# Patient Record
Sex: Female | Born: 1970
Health system: Southern US, Community
[De-identification: ages and names within clinical notes are randomized; demographics above are authoritative.]

## PROBLEM LIST (undated history)

## (undated) DIAGNOSIS — E559 Vitamin D deficiency, unspecified: Secondary | ICD-10-CM

## (undated) DIAGNOSIS — R1310 Dysphagia, unspecified: Secondary | ICD-10-CM

## (undated) DIAGNOSIS — F988 Other specified behavioral and emotional disorders with onset usually occurring in childhood and adolescence: Secondary | ICD-10-CM

## (undated) DIAGNOSIS — M549 Dorsalgia, unspecified: Secondary | ICD-10-CM

## (undated) DIAGNOSIS — D259 Leiomyoma of uterus, unspecified: Secondary | ICD-10-CM

## (undated) DIAGNOSIS — D500 Iron deficiency anemia secondary to blood loss (chronic): Secondary | ICD-10-CM

## (undated) DIAGNOSIS — F419 Anxiety disorder, unspecified: Secondary | ICD-10-CM

## (undated) DIAGNOSIS — R030 Elevated blood-pressure reading, without diagnosis of hypertension: Secondary | ICD-10-CM

## (undated) DIAGNOSIS — G43909 Migraine, unspecified, not intractable, without status migrainosus: Secondary | ICD-10-CM

## (undated) DIAGNOSIS — R079 Chest pain, unspecified: Secondary | ICD-10-CM

## (undated) DIAGNOSIS — F329 Major depressive disorder, single episode, unspecified: Secondary | ICD-10-CM

## (undated) DIAGNOSIS — T7840XA Allergy, unspecified, initial encounter: Secondary | ICD-10-CM

## (undated) DIAGNOSIS — K5900 Constipation, unspecified: Secondary | ICD-10-CM

## (undated) DIAGNOSIS — E785 Hyperlipidemia, unspecified: Secondary | ICD-10-CM

## (undated) DIAGNOSIS — F32A Depression, unspecified: Secondary | ICD-10-CM

## (undated) DIAGNOSIS — K219 Gastro-esophageal reflux disease without esophagitis: Secondary | ICD-10-CM

## (undated) HISTORY — DX: Chest pain, unspecified: R07.9

## (undated) HISTORY — DX: Other specified behavioral and emotional disorders with onset usually occurring in childhood and adolescence: F98.8

## (undated) HISTORY — DX: Dorsalgia, unspecified: M54.9

## (undated) HISTORY — DX: Morbid (severe) obesity due to excess calories: E66.01

## (undated) HISTORY — DX: Hyperlipidemia, unspecified: E78.5

## (undated) HISTORY — PX: FRACTURE SURGERY: SHX138

## (undated) HISTORY — DX: Constipation, unspecified: K59.00

## (undated) HISTORY — DX: Allergy, unspecified, initial encounter: T78.40XA

## (undated) HISTORY — DX: Leiomyoma of uterus, unspecified: D25.9

## (undated) HISTORY — DX: Elevated blood-pressure reading, without diagnosis of hypertension: R03.0

## (undated) HISTORY — DX: Iron deficiency anemia secondary to blood loss (chronic): D50.0

## (undated) HISTORY — DX: Dysphagia, unspecified: R13.10

## (undated) HISTORY — DX: Vitamin D deficiency, unspecified: E55.9

## (undated) HISTORY — PX: ANKLE FRACTURE SURGERY: SHX122

## (undated) HISTORY — DX: Gastro-esophageal reflux disease without esophagitis: K21.9

---

## 1998-01-14 ENCOUNTER — Other Ambulatory Visit: Admission: RE | Admit: 1998-01-14 | Discharge: 1998-01-14 | Payer: Self-pay | Admitting: Obstetrics and Gynecology

## 1998-08-08 HISTORY — PX: HAND SURGERY: SHX662

## 1999-05-03 ENCOUNTER — Encounter: Payer: Self-pay | Admitting: Emergency Medicine

## 1999-05-03 ENCOUNTER — Emergency Department (HOSPITAL_COMMUNITY): Admission: EM | Admit: 1999-05-03 | Discharge: 1999-05-03 | Payer: Self-pay | Admitting: Emergency Medicine

## 1999-11-10 ENCOUNTER — Ambulatory Visit (HOSPITAL_COMMUNITY): Admission: RE | Admit: 1999-11-10 | Discharge: 1999-11-10 | Payer: Self-pay | Admitting: Obstetrics and Gynecology

## 1999-11-10 ENCOUNTER — Encounter: Payer: Self-pay | Admitting: Obstetrics and Gynecology

## 2000-08-08 HISTORY — PX: UTERINE FIBROID SURGERY: SHX826

## 2001-04-18 ENCOUNTER — Other Ambulatory Visit: Admission: RE | Admit: 2001-04-18 | Discharge: 2001-04-18 | Payer: Self-pay | Admitting: Family Medicine

## 2001-04-24 ENCOUNTER — Encounter: Payer: Self-pay | Admitting: Family Medicine

## 2001-04-24 ENCOUNTER — Encounter: Admission: RE | Admit: 2001-04-24 | Discharge: 2001-04-24 | Payer: Self-pay | Admitting: Family Medicine

## 2001-05-18 ENCOUNTER — Encounter (INDEPENDENT_AMBULATORY_CARE_PROVIDER_SITE_OTHER): Payer: Self-pay | Admitting: Specialist

## 2001-05-18 ENCOUNTER — Inpatient Hospital Stay (HOSPITAL_COMMUNITY): Admission: RE | Admit: 2001-05-18 | Discharge: 2001-05-20 | Payer: Self-pay | Admitting: Obstetrics and Gynecology

## 2002-08-08 HISTORY — PX: DILATION AND CURETTAGE OF UTERUS: SHX78

## 2004-07-28 ENCOUNTER — Ambulatory Visit: Payer: Self-pay | Admitting: Family Medicine

## 2004-09-09 ENCOUNTER — Ambulatory Visit: Payer: Self-pay | Admitting: Family Medicine

## 2004-10-02 ENCOUNTER — Ambulatory Visit: Payer: Self-pay | Admitting: Family Medicine

## 2004-12-03 ENCOUNTER — Ambulatory Visit: Payer: Self-pay | Admitting: Family Medicine

## 2005-04-01 ENCOUNTER — Ambulatory Visit: Payer: Self-pay | Admitting: Family Medicine

## 2006-03-23 ENCOUNTER — Ambulatory Visit: Payer: Self-pay | Admitting: Internal Medicine

## 2006-07-05 ENCOUNTER — Ambulatory Visit: Payer: Self-pay | Admitting: Family Medicine

## 2006-07-28 ENCOUNTER — Ambulatory Visit: Payer: Self-pay | Admitting: Family Medicine

## 2006-11-09 ENCOUNTER — Ambulatory Visit: Payer: Self-pay | Admitting: Family Medicine

## 2006-11-09 LAB — CONVERTED CEMR LAB
ALT: 15 units/L (ref 0–40)
AST: 21 units/L (ref 0–37)
Albumin: 3.4 g/dL — ABNORMAL LOW (ref 3.5–5.2)
Alkaline Phosphatase: 57 units/L (ref 39–117)
BUN: 8 mg/dL (ref 6–23)
Basophils Absolute: 0 10*3/uL (ref 0.0–0.1)
Basophils Relative: 0.4 % (ref 0.0–1.0)
Bilirubin, Direct: 0.1 mg/dL (ref 0.0–0.3)
CO2: 32 meq/L (ref 19–32)
Calcium: 9.2 mg/dL (ref 8.4–10.5)
Chloride: 103 meq/L (ref 96–112)
Creatinine, Ser: 0.7 mg/dL (ref 0.4–1.2)
Eosinophils Absolute: 0.1 10*3/uL (ref 0.0–0.6)
Eosinophils Relative: 0.9 % (ref 0.0–5.0)
GFR calc Af Amer: 122 mL/min
GFR calc non Af Amer: 101 mL/min
Glucose, Bld: 89 mg/dL (ref 70–99)
HCT: 35.5 % — ABNORMAL LOW (ref 36.0–46.0)
Hemoglobin: 11.8 g/dL — ABNORMAL LOW (ref 12.0–15.0)
Lymphocytes Relative: 33.7 % (ref 12.0–46.0)
MCHC: 33.4 g/dL (ref 30.0–36.0)
MCV: 88 fL (ref 78.0–100.0)
Monocytes Absolute: 0.9 10*3/uL — ABNORMAL HIGH (ref 0.2–0.7)
Monocytes Relative: 15.9 % — ABNORMAL HIGH (ref 3.0–11.0)
Neutro Abs: 2.8 10*3/uL (ref 1.4–7.7)
Neutrophils Relative %: 49.1 % (ref 43.0–77.0)
Platelets: 312 10*3/uL (ref 150–400)
Potassium: 4.1 meq/L (ref 3.5–5.1)
RBC: 4.03 M/uL (ref 3.87–5.11)
RDW: 15 % — ABNORMAL HIGH (ref 11.5–14.6)
Sodium: 138 meq/L (ref 135–145)
TSH: 2.05 microintl units/mL (ref 0.35–5.50)
Total Bilirubin: 0.8 mg/dL (ref 0.3–1.2)
Total Protein: 8 g/dL (ref 6.0–8.3)
WBC: 5.7 10*3/uL (ref 4.5–10.5)

## 2007-05-25 ENCOUNTER — Encounter: Admission: RE | Admit: 2007-05-25 | Discharge: 2007-05-25 | Payer: Self-pay | Admitting: Obstetrics and Gynecology

## 2008-01-10 ENCOUNTER — Encounter: Payer: Self-pay | Admitting: Family Medicine

## 2008-01-30 ENCOUNTER — Encounter: Payer: Self-pay | Admitting: Family Medicine

## 2010-12-24 NOTE — H&P (Signed)
[facility]  Patient:    [name], [name] Visit Number: [id number] MRN: [id number]          Service Type: GYN Location: 4W 0455 01 Attending Physician:  Malon Kindle Dictated by:   Malachi Pro. Ambrose Mantle, M.D. Admit Date:  05/18/2001                           History and Physical  HISTORY OF PRESENT ILLNESS:  This is a 40 year old, black, single female, para 0-0-1-0, who was admitted to the hospital for myomectomy and right lower abdominal pain.  Her last menstrual period was April 27, 2001.  The patient has not had any sex in quite some time and definitely since her last period.  The patients periods are regular, last four to five days, moderate flow, and moderate to severe dysmenorrhea.  The patient was initially seen in 1998, at which time she had had a history of left lower abdominal pain.  Her examination was felt to be normal.  She was started on birth control pills late in 1998 and stopped them because of nausea.  She reported in 1999 that she had had pain in June of 1999 in the right lower quadrant.  She could not lie on her abdomen.  The pelvic exam revealed what was felt to be a possible endometriosis nodule possibly on the anterior pelvic peritoneum.  In 2001, the patient returned with right lower abdominal pain that had occurred just before her last period, improved during her menses, but then recurred.  There was felt to be a 2 x 2 cm mass either in the right ovary or the right broad ligament.  An ultrasound was requested and the ultrasound showed a pedunculated fibroid on the anterior uterus, which was 3 x 2.3 cm.  The right ovary measured 27 x 33 mm and the left ovary was normal.  Later in 2001, the patient returned and the structure on the right anterior uterus or the peritoneum persisted.  Subsequently the patient was seen in August of 2001, at which time she was complaining of pain in the lower abdomen.  In 2002,  the patient continued to have right lower abdominal pain unrelated to any known activity.  She underwent a subsequent ultrasound and it showed a 4 cm pedunculated anterior uterine fibroid with faint calcification.  There were no other pelvic abnormalities.  The fibroid measured 4.5 x 3.6 cm.  The patient was informed that the fibroid could be causing the pain, although there was certainly no guarantee that removing the fibroid would remove the pain, but she did want to proceed with the surgery.  ALLERGIES:  No known drug allergies.  PAST SURGICAL HISTORY:  The patient had left hand surgery in 2000.  PAST MEDICAL HISTORY:  She has not had any major adult illnesses or heart problems.  She does have headaches.  SOCIAL HISTORY:  She does not drink or smoke.  The patient is a Buyer, retail of [employer].  She went to [employer] and got a degree in business management.  She is in Clinical biochemist for [employer].  MEDICATIONS:  She is on no medications.  FAMILY HISTORY:  Her mother is 36.  Her father is 58.  Two brothers and one sister are living and well.  PHYSICAL EXAMINATION:  A well-developed, obese, black female in no acute distress.  VITAL SIGNS:  Her last blood pressure was 130/76 with a pulse of  80.  HEENT:  No cranial abnormalities.  Extraocular movements are intact.  Nose and pharynx clear.  NECK:  Supple without thyromegaly.  HEART:  Normal size and sounds.  No murmurs.  LUNGS:  Clear to P&A.  BREASTS:  Soft without masses.  ABDOMEN:  Soft and nontender.  No masses are palpable.  The liver, spleen, and kidneys are not felt.  PELVIC:  The vulva and vagina are clean.  The cervix is clean.  The uterus is anterior and normal size.  There is a fibroid on the right side.  The uterus is tender.  The adnexa are clear.  A Pap smear in June of 1999 was within normal limits.  ADMITTING IMPRESSION:  Leiomyomata uteri and right lower abdominal pain.  The patient  is informed that there is a possibility that her right lower abdominal pain is related to the fibroid since the fibroid is tender and it is located in the area where her pain is.  However, she has been given no guarantee as to the outcome of the surgery as far as the pain goes, that our goal is to remove the fibroid and hope the pain resolves.  She understands and she understands the risk of surgery and is ready to proceed. Dictated by:   Malachi Pro. Ambrose Mantle, M.D. Attending Physician:  Malon Kindle DD:  05/18/01 TD:  05/18/01 Job: [id number]

## 2010-12-24 NOTE — Discharge Summary (Signed)
[facility]  Patient:    DACI, STUBBE Visit Number: [id number] MRN: [id number]          Service Type: GYN Location: 4W 0455 01 Attending Physician:  Malon Kindle Dictated by:   Malachi Pro. Ambrose Mantle, M.D. Proc. Date: 05/18/01 Admit Date:  05/18/2001 Discharge Date: 05/20/2001                             Discharge Summary  HISTORY OF PRESENT ILLNESS/HOSPITAL COURSE:  A 40 year old black female with fibroid and right lower abdominal pain for myomectomy.  Patient underwent multiple myomectomies on May 18, 2001, with division of an adhesion of the anterior fibroid to the bladder peritoneum.  Procedure was done by Dr. Ambrose Mantle with Dr. Senaida Ores assisting under general anesthesia.  Blood loss was about 100 cc.  Postoperatively, the patient did very well.  She had no particular problems.  She ambulated well without difficulty, voided well, did not have any flatus, but her abdomen remained soft and nontender in spite of taking plenty of fluids.  On the second postoperative day, she was considered ready for discharge.  Her staples were left in place.  Her abdomen remained soft and nontender.  LABORATORY DATA:  Blood culture on April 27, 2001, hemoglobin 12.0, hematocrit 37.0, white count 4400.  Cholesterol was 275, triglycerides 111, LDL cholesterol 215.  HDL cholesterol could not be read.  Glucose was 94. Electrolytes were normal as were the liver function tests.  Urinalysis was negative.  Postoperative hematocrits the day of surgery and the day after surgery were 33.2 and 32.8.  Pathology report was not available at discharge.  FINAL DIAGNOSES: 1. Multiple leiomyomata uteri. 2. Adhesion of an anterior fibroid to the bladder peritoneum. 3. Right lower abdominal pain.  OPERATION:  Multiple myomectomies, lysis of adhesion of the anterior fibroid to the bladder peritoneum.  FINAL CONDITION:  Improved.  DISCHARGE INSTRUCTIONS:   Liquid diet until passing flatus.  She may use a suppository if desired.  She is to ambulate, call with any fever above 100.4 degrees, call with any unusual problems.  She is to return to the office in two to three days for follow-up examination and to have her staples removed. Mepergan Fortis #16 tablets one every 4 to 6 hours as needed for pain is given at discharge. Dictated by:   Malachi Pro. Ambrose Mantle, M.D. Attending Physician:  Malon Kindle DD:  05/20/01 TD:  05/20/01 Job: [id number]

## 2010-12-24 NOTE — Op Note (Signed)
[facility]  Patient:    [name], [name] Visit Number: [id number] MRN: [id number]          Service Type: GYN Location: 4W 0455 01 Attending Physician:  Malon Kindle Dictated by:   Malachi Pro. Ambrose Mantle, M.D. Proc. Date: 05/18/01 Admit Date:  05/18/2001                             Operative Report  PREOPERATIVE DIAGNOSES: 1. Leiomyomata uteri. 2. Right lower quadrant pain in the area of her fibroid.  POSTOPERATIVE DIAGNOSES:  Multiple fibroids.  OPERATIVE PROCEDURE:  Multiple myomectomy.  SURGEON:  Malachi Pro. Ambrose Mantle, M.D.  ASSISTANT:  Alvino Chapel, M.D.  ANESTHESIA:  General.  ESTIMATED BLOOD LOSS:  200 cc.  URINE OUTPUT:  600 cc clear urine.  COUNTS:  Correct.  DESCRIPTION OF PROCEDURE:  The patient was brought to the operating room and placed under satisfactory general anesthesia and placed in frog-leg position. The abdomen, vulva, vagina and urethra were prepped with Betadine solution, the bladder was catheterized with a Foley catheter hooked to straight drain. Exam revealed the uterus to be irregular, but the patient was quite obese and I could not feel the extent of her fibroids. The patient was then placed supine. The abdomen was draped as a sterile field.  A transverse incision was made and carried in layers through the skin, subcutaneous tissue and fascia. The fascia was separated from the rectus muscles superiorly and inferiorly. The rectus muscle was split in the midline, the peritoneum was opened vertically. I could not examine the upper abdomen because of the small incision low down in the abdomen. Exploration of the pelvis revealed the uterus to be markedly irregular with multiple fibroids, rather than the 1 fibroid that had shown up on ultrasound. The tubes were both normal to their fimbriated ends, both ovaries were normal. The left ovary did have what appeared to be a corpus luteum cyst. Packs and  retractors were used for exposure. I elevated the uterus into the operative field by grasping the anterior fibroid that was the large one that had shown up on ultrasound.  I then removed this by cauterizing the base of it and removing it, making sure that I stayed away from the right tube which was close by. I then controlled bleeding with interrupted #0 Vicryl figure-of-eight sutures. There are at least 7 or 8, maybe more other fibroids that I had to make other incisions to get out. I even had to make an incision on the posterior aspect of the uterus, but it was unavoidable in order to try to reduce the fibroid count to 0. At the end of the procedure when I had taken all the fibroids out, I had completely controlled hemostasis with a combination of #0 and 3-0 Vicryl sutures. I had preserved the tubes without injuring them, and I had reduced the uterine size to a normal nulligravida size. There were no other palpable fibroids left. I diligently searched for any bleeding; there was none and I removed all packs and retractors and closed the abdominal wall with a running suture of #0 Vicryl on the peritoneum, interrupted #0 Vicryl on the rectus muscle, two running sutures of #0 Vicryl on the fascia, a running 3-0 Vicryl on the subcutaneous tissue and staples on the skin. The patient seemed to tolerate the procedure well. Blood loss was about 100 cc.  Sponge, needle, lap and instrument counts were correct  and she was returned to recovery in satisfactory condition having put out 600 cc of clear urine. Dictated by:   Malachi Pro. Ambrose Mantle, M.D. Attending Physician:  Malon Kindle DD:  05/18/01 TD:  05/19/01 Job: [id number]

## 2012-02-06 ENCOUNTER — Other Ambulatory Visit (HOSPITAL_BASED_OUTPATIENT_CLINIC_OR_DEPARTMENT_OTHER): Payer: Self-pay | Admitting: Family Medicine

## 2012-02-06 DIAGNOSIS — R51 Headache: Secondary | ICD-10-CM

## 2012-02-11 ENCOUNTER — Other Ambulatory Visit (HOSPITAL_BASED_OUTPATIENT_CLINIC_OR_DEPARTMENT_OTHER): Payer: Self-pay

## 2012-02-14 ENCOUNTER — Ambulatory Visit (HOSPITAL_BASED_OUTPATIENT_CLINIC_OR_DEPARTMENT_OTHER)
Admission: RE | Admit: 2012-02-14 | Discharge: 2012-02-14 | Disposition: A | Discharge status: Home / Self Care | Payer: BC Managed Care – PPO | Source: Ambulatory Visit | Attending: Family Medicine | Admitting: Family Medicine

## 2012-02-14 DIAGNOSIS — H538 Other visual disturbances: Secondary | ICD-10-CM | POA: Insufficient documentation

## 2012-02-14 DIAGNOSIS — R51 Headache: Secondary | ICD-10-CM

## 2012-02-14 MED ORDER — GADOBENATE DIMEGLUMINE 529 MG/ML IV SOLN
20.0000 mL | Freq: Once | INTRAVENOUS | Status: AC | PRN
  Administered 2012-02-14: 20 mL via INTRAVENOUS

## 2012-03-18 ENCOUNTER — Emergency Department (HOSPITAL_BASED_OUTPATIENT_CLINIC_OR_DEPARTMENT_OTHER)
Admission: EM | Admit: 2012-03-18 | Discharge: 2012-03-18 | Disposition: A | Discharge status: Home / Self Care | Payer: BC Managed Care – PPO | Attending: Emergency Medicine | Admitting: Emergency Medicine

## 2012-03-18 ENCOUNTER — Encounter (HOSPITAL_BASED_OUTPATIENT_CLINIC_OR_DEPARTMENT_OTHER): Payer: Self-pay | Admitting: *Deleted

## 2012-03-18 DIAGNOSIS — F3289 Other specified depressive episodes: Secondary | ICD-10-CM | POA: Insufficient documentation

## 2012-03-18 DIAGNOSIS — F411 Generalized anxiety disorder: Secondary | ICD-10-CM | POA: Insufficient documentation

## 2012-03-18 DIAGNOSIS — F329 Major depressive disorder, single episode, unspecified: Secondary | ICD-10-CM | POA: Insufficient documentation

## 2012-03-18 DIAGNOSIS — H5710 Ocular pain, unspecified eye: Secondary | ICD-10-CM | POA: Insufficient documentation

## 2012-03-18 DIAGNOSIS — H538 Other visual disturbances: Secondary | ICD-10-CM | POA: Insufficient documentation

## 2012-03-18 DIAGNOSIS — H539 Unspecified visual disturbance: Secondary | ICD-10-CM

## 2012-03-18 HISTORY — DX: Major depressive disorder, single episode, unspecified: F32.9

## 2012-03-18 HISTORY — DX: Depression, unspecified: F32.A

## 2012-03-18 HISTORY — DX: Anxiety disorder, unspecified: F41.9

## 2012-03-18 HISTORY — DX: Migraine, unspecified, not intractable, without status migrainosus: G43.909

## 2012-03-18 MED ORDER — FLUORESCEIN SODIUM 1 MG OP STRP
ORAL_STRIP | OPHTHALMIC | Status: AC
  Administered 2012-03-18: 1 via OPHTHALMIC
  Filled 2012-03-18: qty 1

## 2012-03-18 MED ORDER — TETRACAINE HCL 0.5 % OP SOLN
1.0000 [drp] | Freq: Once | OPHTHALMIC | Status: AC
  Administered 2012-03-18: 1 [drp] via OPHTHALMIC

## 2012-03-18 MED ORDER — TETRACAINE HCL 0.5 % OP SOLN
OPHTHALMIC | Status: AC
  Administered 2012-03-18: 1 [drp] via OPHTHALMIC
  Filled 2012-03-18: qty 2

## 2012-03-18 MED ORDER — FLUORESCEIN SODIUM 1 MG OP STRP
1.0000 | ORAL_STRIP | Freq: Once | OPHTHALMIC | Status: AC
  Administered 2012-03-18: 1 via OPHTHALMIC

## 2012-03-18 NOTE — ED Notes (Signed)
MD at bedside. 

## 2012-03-18 NOTE — ED Notes (Signed)
Patient states she went for a run at 1100 today and had a sudden onset of eye pain.  Sclera red in color.  States it feels like something is in her eye.  Was seen by her PCP and sent here for further evaluation.

## 2012-03-18 NOTE — ED Notes (Signed)
Pt sent to ED per PCP. Pt c/o the appearance of a "green tail" in right eye since 11 am this am. Pt denies injury, pain.

## 2012-03-18 NOTE — ED Provider Notes (Addendum)
History  This chart was scribed for Rolan Bucco, MD by Shari Heritage. The patient was seen in room MH12/MH12. Patient's care was started at 1352.     CSN: [id number]  Arrival date & time 03/18/12  1352   First MD Initiated Contact with Patient 03/18/12 1504      Chief Complaint  Patient presents with  . Eye Pain    right    (Consider location/radiation/quality/duration/timing/severity/associated sxs/prior treatment) HPI [name] is a 41 y.o. female who presents to the Emergency Department complaining of right eye discomfort onset 3 hours ago with associated blurriness. Patient says that she started experiencing mild discomfort in her eye. Patient denies any pain. Patient states that she feels like there is something in her eye and that she sees something on her eye when she moves it. Describes it as green algae over center of eye. She denies seeing a film over the eye. Patient saw her PCP today who sent her to the ED for a more detailed examination. Patient has a medical history of anxiety and depression. She has never smoked. Does not wear contacts  PCP - [facility])   Past Medical History  Diagnosis Date  . Depression   . Anxiety   . Migraine     Past Surgical History  Procedure Date  . Uterine fibroid surgery   . Hand surgery     No family history on file.  History  Substance Use Topics  . Smoking status: Never Smoker   . Smokeless tobacco: Not on file  . Alcohol Use: No    OB History    Grav Para Term Preterm Abortions TAB SAB Ect Mult Living                  Review of Systems  Constitutional: Negative for fever, chills, diaphoresis and fatigue.  HENT: Negative for congestion, rhinorrhea and sneezing.   Eyes: Positive for visual disturbance.  Respiratory: Negative for cough, chest tightness and shortness of breath.   Cardiovascular: Negative for chest pain and leg swelling.  Gastrointestinal: Negative for nausea, vomiting,  abdominal pain, diarrhea and blood in stool.  Genitourinary: Negative for frequency, hematuria, flank pain and difficulty urinating.  Musculoskeletal: Negative for back pain and arthralgias.  Skin: Negative for rash.  Neurological: Negative for dizziness, speech difficulty, weakness, numbness and headaches.    Allergies  Review of patient's allergies indicates no known allergies.  Home Medications   Current Outpatient Rx  Name Route Sig Dispense Refill  . AMITRIPTYLINE HCL 25 MG PO TABS Oral Take 25 mg by mouth at bedtime.    . ASPIRIN 81 MG PO TABS Oral Take 162 mg by mouth daily.    . BUPROPION HCL 100 MG PO TABS Oral Take 100 mg by mouth 2 (two) times daily.    Marland Kitchen MAGNESIUM OXIDE 400 MG PO TABS Oral Take 400 mg by mouth daily.    Marland Kitchen VITAMIN D (ERGOCALCIFEROL) 50000 UNITS PO CAPS Oral Take 50,000 Units by mouth.      BP 119/73  Pulse 74  Temp 98.4 F (36.9 C) (Oral)  Resp 20  Ht 5\' 4"  (1.626 m)  Wt 237 lb (107.502 kg)  BMI 40.68 kg/m2  SpO2 100%  LMP 02/22/2012  Physical Exam  Constitutional: She is oriented to person, place, and time. She appears well-developed and well-nourished.  HENT:  Head: Normocephalic and atraumatic.  Eyes: Conjunctivae and EOM are normal. Pupils are equal, round, and reactive to light. Left  eye exhibits no discharge.       No noticeable redness to eye.  No FB seen.  Pressures 19-23 in right eye.  Slit lamp exam unremarkable.  No corneal abrasions/ulcers seen.  Neck: Normal range of motion. Neck supple.  Pulmonary/Chest: Effort normal and breath sounds normal. No respiratory distress.  Musculoskeletal: Normal range of motion.  Neurological: She is alert and oriented to person, place, and time.  Skin: Skin is warm and dry. No rash noted.  Psychiatric: She has a normal mood and affect.    ED Course  Procedures (including critical care time) DIAGNOSTIC STUDIES: Oxygen Saturation is 100% on room air, normal by my interpretation.    COORDINATION  OF CARE: 3:13pm- Patient informed of current plan for treatment and evaluation and agrees with plan at this time.      Labs Reviewed - No data to display  No results found.   1. Vision changes       MDM  Pt has no redness to the eye.  Pressures normal.  Vision mildly decreased in eye.  Pt to f/u with her optometrist tomorrow     I personally performed the services described in this documentation, which was scribed in my presence.  The recorded information has been reviewed and considered.    Rolan Bucco, MD 03/18/12 1610  Rolan Bucco, MD 03/18/12 [phone]

## 2013-10-03 ENCOUNTER — Observation Stay (HOSPITAL_COMMUNITY)
Admission: EM | Admit: 2013-10-03 | Discharge: 2013-10-04 | Disposition: A | Discharge status: Home / Self Care | Payer: BC Managed Care – PPO | Attending: Orthopedic Surgery | Admitting: Orthopedic Surgery

## 2013-10-03 ENCOUNTER — Encounter (HOSPITAL_COMMUNITY): Payer: BC Managed Care – PPO | Admitting: Anesthesiology

## 2013-10-03 ENCOUNTER — Encounter (HOSPITAL_COMMUNITY): Payer: Self-pay | Admitting: Emergency Medicine

## 2013-10-03 ENCOUNTER — Emergency Department (HOSPITAL_COMMUNITY): Payer: BC Managed Care – PPO

## 2013-10-03 ENCOUNTER — Emergency Department (HOSPITAL_COMMUNITY): Payer: BC Managed Care – PPO | Admitting: Anesthesiology

## 2013-10-03 ENCOUNTER — Other Ambulatory Visit (HOSPITAL_COMMUNITY): Payer: Self-pay | Admitting: Orthopedic Surgery

## 2013-10-03 ENCOUNTER — Encounter (HOSPITAL_COMMUNITY)
Admission: EM | Disposition: A | Discharge status: Home / Self Care | Payer: Self-pay | Source: Home / Self Care | Attending: Emergency Medicine

## 2013-10-03 DIAGNOSIS — S8263XA Displaced fracture of lateral malleolus of unspecified fibula, initial encounter for closed fracture: Secondary | ICD-10-CM | POA: Diagnosis present

## 2013-10-03 DIAGNOSIS — W010XXA Fall on same level from slipping, tripping and stumbling without subsequent striking against object, initial encounter: Secondary | ICD-10-CM | POA: Insufficient documentation

## 2013-10-03 DIAGNOSIS — Z880 Allergy status to penicillin: Secondary | ICD-10-CM | POA: Insufficient documentation

## 2013-10-03 DIAGNOSIS — S82899A Other fracture of unspecified lower leg, initial encounter for closed fracture: Principal | ICD-10-CM | POA: Insufficient documentation

## 2013-10-03 DIAGNOSIS — S82401A Unspecified fracture of shaft of right fibula, initial encounter for closed fracture: Secondary | ICD-10-CM

## 2013-10-03 HISTORY — PX: ORIF ANKLE FRACTURE: SHX5408

## 2013-10-03 LAB — CBC
HCT: 32.5 % — ABNORMAL LOW (ref 36.0–46.0)
HEMOGLOBIN: 10.8 g/dL — AB (ref 12.0–15.0)
MCH: 29.6 pg (ref 26.0–34.0)
MCHC: 33.2 g/dL (ref 30.0–36.0)
MCV: 89 fL (ref 78.0–100.0)
PLATELETS: 353 10*3/uL (ref 150–400)
RBC: 3.65 MIL/uL — AB (ref 3.87–5.11)
RDW: 14.1 % (ref 11.5–15.5)
WBC: 5.9 10*3/uL (ref 4.0–10.5)

## 2013-10-03 LAB — COMPREHENSIVE METABOLIC PANEL
ALT: 15 U/L (ref 0–35)
AST: 19 U/L (ref 0–37)
Albumin: 3.1 g/dL — ABNORMAL LOW (ref 3.5–5.2)
Alkaline Phosphatase: 54 U/L (ref 39–117)
BUN: 9 mg/dL (ref 6–23)
CHLORIDE: 103 meq/L (ref 96–112)
CO2: 21 mEq/L (ref 19–32)
Calcium: 8.2 mg/dL — ABNORMAL LOW (ref 8.4–10.5)
Creatinine, Ser: 0.75 mg/dL (ref 0.50–1.10)
GFR calc Af Amer: >90 mL/min (ref >90)
GLUCOSE: 125 mg/dL — AB (ref 70–99)
Potassium: 4.2 mEq/L (ref 3.7–5.3)
Sodium: 137 mEq/L (ref 137–147)
Total Bilirubin: <0.2 mg/dL — ABNORMAL LOW (ref 0.3–1.2)
Total Protein: 7.2 g/dL (ref 6.0–8.3)

## 2013-10-03 LAB — PROTIME-INR
INR: 1.05 (ref 0.00–1.49)
Prothrombin Time: 13.5 seconds (ref 11.6–15.2)

## 2013-10-03 LAB — HCG, SERUM, QUALITATIVE: PREG SERUM: NEGATIVE

## 2013-10-03 SURGERY — OPEN REDUCTION INTERNAL FIXATION (ORIF) ANKLE FRACTURE
Anesthesia: General | Site: Ankle | Laterality: Right

## 2013-10-03 MED ORDER — ONDANSETRON HCL 4 MG/2ML IJ SOLN: 4.0000 mg | Freq: Four times a day (QID) | INTRAMUSCULAR | Status: DC | PRN

## 2013-10-03 MED ORDER — DEXAMETHASONE SODIUM PHOSPHATE 4 MG/ML IJ SOLN
INTRAMUSCULAR | Status: DC | PRN
  Administered 2013-10-03: 8 mg via INTRAVENOUS

## 2013-10-03 MED ORDER — OXYCODONE-ACETAMINOPHEN 5-325 MG PO TABS
1.0000 | ORAL_TABLET | ORAL | Status: DC | PRN
  Administered 2013-10-03 – 2013-10-04 (×4): 2 via ORAL
  Filled 2013-10-03 (×3): qty 2

## 2013-10-03 MED ORDER — FENTANYL CITRATE 0.05 MG/ML IJ SOLN
INTRAMUSCULAR | Status: DC | PRN
  Administered 2013-10-03: 50 ug via INTRAVENOUS
  Administered 2013-10-03 (×2): 100 ug via INTRAVENOUS

## 2013-10-03 MED ORDER — ROCURONIUM BROMIDE 100 MG/10ML IV SOLN
INTRAVENOUS | Status: DC | PRN
  Administered 2013-10-03: 40 mg via INTRAVENOUS

## 2013-10-03 MED ORDER — METOCLOPRAMIDE HCL 10 MG PO TABS: 5.0000 mg | ORAL_TABLET | Freq: Three times a day (TID) | ORAL | Status: DC | PRN

## 2013-10-03 MED ORDER — ASPIRIN EC 325 MG PO TBEC
325.0000 mg | DELAYED_RELEASE_TABLET | Freq: Every day | ORAL | Status: DC
  Administered 2013-10-03 – 2013-10-04 (×2): 325 mg via ORAL
  Filled 2013-10-03 (×2): qty 1

## 2013-10-03 MED ORDER — HYDROMORPHONE HCL PF 1 MG/ML IJ SOLN: 0.2500 mg | INTRAMUSCULAR | Status: DC | PRN

## 2013-10-03 MED ORDER — ONDANSETRON HCL 4 MG/2ML IJ SOLN
INTRAMUSCULAR | Status: AC
  Filled 2013-10-03: qty 2

## 2013-10-03 MED ORDER — CLINDAMYCIN PHOSPHATE 600 MG/50ML IV SOLN
600.0000 mg | Freq: Four times a day (QID) | INTRAVENOUS | Status: AC
  Administered 2013-10-03 – 2013-10-04 (×3): 600 mg via INTRAVENOUS
  Filled 2013-10-03 (×3): qty 50

## 2013-10-03 MED ORDER — METHOCARBAMOL 100 MG/ML IJ SOLN
500.0000 mg | Freq: Four times a day (QID) | INTRAVENOUS | Status: DC | PRN
  Filled 2013-10-03: qty 5

## 2013-10-03 MED ORDER — HYDROMORPHONE HCL PF 2 MG/ML IJ SOLN
2.0000 mg | Freq: Once | INTRAMUSCULAR | Status: AC
  Administered 2013-10-03: 2 mg via INTRAMUSCULAR
  Filled 2013-10-03: qty 1

## 2013-10-03 MED ORDER — METOCLOPRAMIDE HCL 5 MG/ML IJ SOLN: 5.0000 mg | Freq: Three times a day (TID) | INTRAMUSCULAR | Status: DC | PRN

## 2013-10-03 MED ORDER — METHOCARBAMOL 500 MG PO TABS
500.0000 mg | ORAL_TABLET | Freq: Four times a day (QID) | ORAL | Status: DC | PRN
Start: 2013-10-03 — End: 2013-10-04
  Administered 2013-10-03 – 2013-10-04 (×4): 500 mg via ORAL
  Filled 2013-10-03 (×4): qty 1

## 2013-10-03 MED ORDER — BUPIVACAINE HCL 0.5 % IJ SOLN
INTRAMUSCULAR | Status: DC | PRN
  Administered 2013-10-03: 10 mL

## 2013-10-03 MED ORDER — HYDROMORPHONE HCL PF 1 MG/ML IJ SOLN
INTRAMUSCULAR | Status: AC
  Filled 2013-10-03: qty 1

## 2013-10-03 MED ORDER — OXYCODONE-ACETAMINOPHEN 5-325 MG PO TABS
ORAL_TABLET | ORAL | Status: AC
  Filled 2013-10-03: qty 2

## 2013-10-03 MED ORDER — FENTANYL CITRATE 0.05 MG/ML IJ SOLN
INTRAMUSCULAR | Status: AC
  Filled 2013-10-03: qty 5

## 2013-10-03 MED ORDER — NEOSTIGMINE METHYLSULFATE 1 MG/ML IJ SOLN
INTRAMUSCULAR | Status: DC | PRN
  Administered 2013-10-03: 5 mg via INTRAVENOUS

## 2013-10-03 MED ORDER — SODIUM CHLORIDE 0.9 % IV SOLN
INTRAVENOUS | Status: DC
  Administered 2013-10-03: 20:00:00 via INTRAVENOUS

## 2013-10-03 MED ORDER — METHOCARBAMOL 500 MG PO TABS
ORAL_TABLET | ORAL | Status: AC
  Filled 2013-10-03: qty 1

## 2013-10-03 MED ORDER — CLINDAMYCIN PHOSPHATE 900 MG/50ML IV SOLN
900.0000 mg | INTRAVENOUS | Status: AC
  Administered 2013-10-03: 900 mg via INTRAVENOUS
  Filled 2013-10-03: qty 50

## 2013-10-03 MED ORDER — ONDANSETRON HCL 4 MG/2ML IJ SOLN
INTRAMUSCULAR | Status: DC | PRN
  Administered 2013-10-03: 4 mg via INTRAVENOUS

## 2013-10-03 MED ORDER — MIDAZOLAM HCL 2 MG/2ML IJ SOLN
INTRAMUSCULAR | Status: AC
  Filled 2013-10-03: qty 2

## 2013-10-03 MED ORDER — LIDOCAINE HCL (CARDIAC) 20 MG/ML IV SOLN
INTRAVENOUS | Status: DC | PRN
  Administered 2013-10-03: 100 mg via INTRAVENOUS

## 2013-10-03 MED ORDER — ONDANSETRON HCL 4 MG PO TABS: 4.0000 mg | ORAL_TABLET | Freq: Four times a day (QID) | ORAL | Status: DC | PRN

## 2013-10-03 MED ORDER — PROPOFOL 10 MG/ML IV BOLUS
INTRAVENOUS | Status: AC
  Filled 2013-10-03: qty 20

## 2013-10-03 MED ORDER — OXYCODONE HCL 5 MG PO TABS: 5.0000 mg | ORAL_TABLET | Freq: Once | ORAL | Status: DC | PRN

## 2013-10-03 MED ORDER — LACTATED RINGERS IV SOLN
INTRAVENOUS | Status: DC
  Administered 2013-10-03: 14:00:00 via INTRAVENOUS

## 2013-10-03 MED ORDER — SODIUM CHLORIDE 0.9 % IV BOLUS (SEPSIS)
1000.0000 mL | Freq: Once | INTRAVENOUS | Status: AC
  Administered 2013-10-03: 1000 mL via INTRAVENOUS

## 2013-10-03 MED ORDER — LACTATED RINGERS IV SOLN
INTRAVENOUS | Status: DC | PRN
  Administered 2013-10-03 (×2): via INTRAVENOUS

## 2013-10-03 MED ORDER — TETANUS-DIPHTH-ACELL PERTUSSIS 5-2.5-18.5 LF-MCG/0.5 IM SUSP
0.5000 mL | Freq: Once | INTRAMUSCULAR | Status: AC
  Administered 2013-10-03: 0.5 mL via INTRAMUSCULAR
  Filled 2013-10-03: qty 0.5

## 2013-10-03 MED ORDER — BUPROPION HCL ER (XL) 150 MG PO TB24
150.0000 mg | ORAL_TABLET | Freq: Every day | ORAL | Status: DC
  Filled 2013-10-03 (×2): qty 1

## 2013-10-03 MED ORDER — HYDROMORPHONE HCL PF 1 MG/ML IJ SOLN
0.5000 mg | INTRAMUSCULAR | Status: DC | PRN
  Administered 2013-10-03: 1 mg via INTRAVENOUS

## 2013-10-03 MED ORDER — ARTIFICIAL TEARS OP OINT
TOPICAL_OINTMENT | OPHTHALMIC | Status: DC | PRN
  Administered 2013-10-03: 1 via OPHTHALMIC

## 2013-10-03 MED ORDER — PROMETHAZINE HCL 25 MG/ML IJ SOLN: 6.2500 mg | INTRAMUSCULAR | Status: DC | PRN

## 2013-10-03 MED ORDER — MIDAZOLAM HCL 5 MG/5ML IJ SOLN
INTRAMUSCULAR | Status: DC | PRN
  Administered 2013-10-03: 2 mg via INTRAVENOUS

## 2013-10-03 MED ORDER — GLYCOPYRROLATE 0.2 MG/ML IJ SOLN
INTRAMUSCULAR | Status: DC | PRN
  Administered 2013-10-03: 0.6 mg via INTRAVENOUS

## 2013-10-03 MED ORDER — OXYCODONE HCL 5 MG/5ML PO SOLN: 5.0000 mg | Freq: Once | ORAL | Status: DC | PRN

## 2013-10-03 MED ORDER — PROPOFOL 10 MG/ML IV BOLUS
INTRAVENOUS | Status: DC | PRN
  Administered 2013-10-03: 150 mg via INTRAVENOUS

## 2013-10-03 MED ORDER — ONDANSETRON HCL 4 MG/2ML IJ SOLN
4.0000 mg | Freq: Once | INTRAMUSCULAR | Status: AC
  Administered 2013-10-03: 4 mg via INTRAVENOUS

## 2013-10-03 SURGICAL SUPPLY — 52 items
BANDAGE ESMARK 6X9 LF (GAUZE/BANDAGES/DRESSINGS) IMPLANT
BANDAGE GAUZE ELAST BULKY 4 IN (GAUZE/BANDAGES/DRESSINGS) ×1 IMPLANT
BNDG CMPR 9X6 STRL LF SNTH (GAUZE/BANDAGES/DRESSINGS)
BNDG COHESIVE 4X5 TAN STRL (GAUZE/BANDAGES/DRESSINGS) ×2 IMPLANT
BNDG COHESIVE 6X5 TAN STRL LF (GAUZE/BANDAGES/DRESSINGS) ×1 IMPLANT
BNDG ESMARK 6X9 LF (GAUZE/BANDAGES/DRESSINGS)
BNDG GAUZE STRTCH 6 (GAUZE/BANDAGES/DRESSINGS) ×2 IMPLANT
CLOTH BEACON ORANGE TIMEOUT ST (SAFETY) ×2 IMPLANT
COVER SURGICAL LIGHT HANDLE (MISCELLANEOUS) ×2 IMPLANT
CUFF TOURNIQUET SINGLE 34IN LL (TOURNIQUET CUFF) IMPLANT
CUFF TOURNIQUET SINGLE 44IN (TOURNIQUET CUFF) IMPLANT
DRAPE C-ARM MINI 42X72 WSTRAPS (DRAPES) IMPLANT
DRAPE INCISE IOBAN 66X45 STRL (DRAPES) ×2 IMPLANT
DRAPE PROXIMA HALF (DRAPES) ×2 IMPLANT
DRAPE U-SHAPE 47X51 STRL (DRAPES) ×2 IMPLANT
DRSG ADAPTIC 3X8 NADH LF (GAUZE/BANDAGES/DRESSINGS) ×2 IMPLANT
DURAPREP 26ML APPLICATOR (WOUND CARE) ×2 IMPLANT
ELECT REM PT RETURN 9FT ADLT (ELECTROSURGICAL) ×2
ELECTRODE REM PT RTRN 9FT ADLT (ELECTROSURGICAL) ×1 IMPLANT
GLOVE BIOGEL PI IND STRL 9 (GLOVE) ×1 IMPLANT
GLOVE BIOGEL PI INDICATOR 9 (GLOVE) ×1
GLOVE SURG ORTHO 9.0 STRL STRW (GLOVE) ×2 IMPLANT
GOWN PREVENTION PLUS XLARGE (GOWN DISPOSABLE) ×2 IMPLANT
GOWN SRG XL XLNG 56XLVL 4 (GOWN DISPOSABLE) ×2 IMPLANT
GOWN STRL NON-REIN XL XLG LVL4 (GOWN DISPOSABLE) ×4
KIT BASIN OR (CUSTOM PROCEDURE TRAY) ×2 IMPLANT
KIT ROOM TURNOVER OR (KITS) ×2 IMPLANT
MANIFOLD NEPTUNE II (INSTRUMENTS) ×2 IMPLANT
NS IRRIG 1000ML POUR BTL (IV SOLUTION) ×2 IMPLANT
PACK ORTHO EXTREMITY (CUSTOM PROCEDURE TRAY) ×2 IMPLANT
PAD ABD 8X10 STRL (GAUZE/BANDAGES/DRESSINGS) ×1 IMPLANT
PAD ARMBOARD 7.5X6 YLW CONV (MISCELLANEOUS) ×4 IMPLANT
PADDING CAST COTTON 6X4 STRL (CAST SUPPLIES) ×2 IMPLANT
SCREW CORTEX 3.5 12MM (Screw) ×2 IMPLANT
SCREW CORTEX 3.5 14MM (Screw) ×2 IMPLANT
SCREW CORTEX 3.5 22MM (Screw) ×1 IMPLANT
SCREW LOCK CORT ST 3.5X12 (Screw) IMPLANT
SCREW LOCK CORT ST 3.5X14 (Screw) IMPLANT
SCREW LOCK CORT ST 3.5X22 (Screw) IMPLANT
SCREW LOCK T15 FT 12X3.5X2.9X (Screw) IMPLANT
SCREW LOCKING 3.5X12 (Screw) ×2 IMPLANT
SPONGE GAUZE 4X4 12PLY (GAUZE/BANDAGES/DRESSINGS) ×2 IMPLANT
SPONGE GAUZE 4X4 12PLY STER LF (GAUZE/BANDAGES/DRESSINGS) ×1 IMPLANT
SPONGE LAP 18X18 X RAY DECT (DISPOSABLE) ×2 IMPLANT
STAPLER VISISTAT 35W (STAPLE) IMPLANT
SUCTION FRAZIER TIP 10 FR DISP (SUCTIONS) ×2 IMPLANT
SUT ETHILON 2 0 PSLX (SUTURE) IMPLANT
SUT VIC AB 2-0 CTB1 (SUTURE) ×4 IMPLANT
TOWEL OR 17X24 6PK STRL BLUE (TOWEL DISPOSABLE) ×2 IMPLANT
TOWEL OR 17X26 10 PK STRL BLUE (TOWEL DISPOSABLE) ×2 IMPLANT
TUBE CONNECTING 12X1/4 (SUCTIONS) ×2 IMPLANT
WATER STERILE IRR 1000ML POUR (IV SOLUTION) ×2 IMPLANT

## 2013-10-03 NOTE — Anesthesia Postprocedure Evaluation (Signed)
  Anesthesia Post-op Note  Patient: [name]  Procedure(s) Performed: Procedure(s) with comments: OPEN REDUCTION INTERNAL FIXATION (ORIF) ANKLE FRACTURE (Right) - Open Reduction Internal Fixation Right Ankle  Patient Location: PACU  Anesthesia Type:General  Level of Consciousness: awake, alert  and oriented  Airway and Oxygen Therapy: Patient Spontanous Breathing  Post-op Pain: mild  Post-op Assessment: Post-op Vital signs reviewed, Patient's Cardiovascular Status Stable, Respiratory Function Stable, Patent Airway and Pain level controlled  Post-op Vital Signs: stable  Complications: No apparent anesthesia complications

## 2013-10-03 NOTE — Preoperative (Signed)
Beta Blockers   Reason not to administer Beta Blockers:Not Applicable 

## 2013-10-03 NOTE — Discharge Instructions (Signed)
Ice elevation right leg. Keep dressing clean and dry. Nonweightbearing right foot

## 2013-10-03 NOTE — Transfer of Care (Signed)
Immediate Anesthesia Transfer of Care Note  Patient: [name]  Procedure(s) Performed: Procedure(s) with comments: OPEN REDUCTION INTERNAL FIXATION (ORIF) ANKLE FRACTURE (Right) - Open Reduction Internal Fixation Right Ankle  Patient Location: PACU  Anesthesia Type:General  Level of Consciousness: awake, alert , oriented and patient cooperative  Airway & Oxygen Therapy: Patient Spontanous Breathing and Patient connected to nasal cannula oxygen  Post-op Assessment: Report given to PACU RN, Post -op Vital signs reviewed and stable and Patient moving all extremities X 4  Post vital signs: Reviewed and stable  Complications: No apparent anesthesia complications

## 2013-10-03 NOTE — Op Note (Signed)
OPERATIVE REPORT  DATE OF SURGERY: 10/03/2013  PATIENT:  [name],  43 y.o. female  PRE-OPERATIVE DIAGNOSIS:  Right Ankle Fracture  POST-OPERATIVE DIAGNOSIS:  Right Ankle Fracture  PROCEDURE:  Procedure(s): OPEN REDUCTION INTERNAL FIXATION (ORIF) ANKLE FRACTURE  SURGEON:  Surgeon(s): Newt Minion, MD  ANESTHESIA:   local and general  EBL:  min ML  SPECIMEN:  No Specimen  TOURNIQUET:  * No tourniquets in log *  PROCEDURE DETAILS: Patient is a 43 year old woman who slipped on the snow sustaining a displaced Weber B. fibular fracture with widening of the mortise medially. Patient presents at this time for open reduction internal fixation. Risks and benefits were discussed including infection neurovascular injury pain arthritis need for additional surgery. Patient states she understands and wished to proceed at this time. Description of procedure patient was brought to the operating room and underwent a general anesthetic. After adequate levels of anesthesia were obtained patient's right lower extremity was prepped using DuraPrep and draped into a sterile field. A lateral incision was made over the fibula this was carried sharply down to bone the fracture edges were freshened and the fracture was reduced. A lag screw was placed perpendicular to stabilize the fracture. A anti-glide neutralizing plate was placed posterior laterally and this was secured proximally with 3 compression screws and distally with a locking screw. C-arm fluoroscopy verified reduction of the mortise both AP and lateral planes. The wound is irrigated with normal saline. Subcutaneous is closed using 2-0 Vicryl. Skin was closing staples. The wound was covered with Adaptic orthopedic sponges ABDs dressing Kerlix and Coban. Patient was extubated taken to the PACU in stable condition.  PLAN OF CARE: Admit to inpatient   PATIENT DISPOSITION:  PACU - hemodynamically stable.   Newt Minion, MD 10/03/2013 4:16  PM

## 2013-10-03 NOTE — ED Notes (Signed)
Per ems pt went out into snow; fell and couldn't get up due to right ankle pain; denies neck and back pain; right ankle swelling per EMS with small abrasion-no obvious deformity; splint placed by EMS

## 2013-10-03 NOTE — H&P (Signed)
[name] is an 43 y.o. female.   Chief Complaint: Right ankle fracture HPI: Patient is a 43 year old woman who states she went outside to try to take pictures and the snow slipped and states her ankle turned sideways.  Past Medical History  Diagnosis Date  . Migraine     History reviewed. No pertinent past surgical history.  No family history on file. Social History:  reports that she has never smoked. She does not have any smokeless tobacco history on file. Her alcohol and drug histories are not on file.  Allergies:  Allergies  Allergen Reactions  . Penicillins Other (See Comments)    unknown    Medications Prior to Admission  Medication Sig Dispense Refill  . buPROPion (WELLBUTRIN XL) 150 MG 24 hr tablet Take 150 mg by mouth daily.      Marland Kitchen ibuprofen (ADVIL,MOTRIN) 200 MG tablet Take 400 mg by mouth every 6 (six) hours as needed for headache or moderate pain.      . Magnesium 500 MG CAPS Take 1,000 mg by mouth daily.      . Multiple Vitamins-Minerals (HAIR/SKIN/NAILS PO) Take 1 tablet by mouth daily.      . Sodium Chloride-Sodium Bicarb 1.57 G PACK Place 1 each into the nose daily as needed. Congestion      . vitamin B-12 (CYANOCOBALAMIN) 1000 MCG tablet Take 5,000 mcg by mouth daily.        Results for orders placed during the hospital encounter of 10/03/13 (from the past 48 hour(s))  CBC     Status: Abnormal   Collection Time    10/03/13  2:04 PM      Result Value Ref Range   WBC 5.9  4.0 - 10.5 K/uL   RBC 3.65 (*) 3.87 - 5.11 MIL/uL   Hemoglobin 10.8 (*) 12.0 - 15.0 g/dL   HCT 32.5 (*) 36.0 - 46.0 %   MCV 89.0  78.0 - 100.0 fL   MCH 29.6  26.0 - 34.0 pg   MCHC 33.2  30.0 - 36.0 g/dL   RDW 14.1  11.5 - 15.5 %   Platelets 353  150 - 400 K/uL  COMPREHENSIVE METABOLIC PANEL     Status: Abnormal   Collection Time    10/03/13  2:04 PM      Result Value Ref Range   Sodium 137  137 - 147 mEq/L   Potassium 4.2  3.7 - 5.3 mEq/L   Chloride 103  96 - 112 mEq/L   CO2 21  19 - 32 mEq/L   Glucose, Bld 125 (*) 70 - 99 mg/dL   BUN 9  6 - 23 mg/dL   Creatinine, Ser 0.75  0.50 - 1.10 mg/dL   Calcium 8.2 (*) 8.4 - 10.5 mg/dL   Total Protein 7.2  6.0 - 8.3 g/dL   Albumin 3.1 (*) 3.5 - 5.2 g/dL   AST 19  0 - 37 U/L   ALT 15  0 - 35 U/L   Alkaline Phosphatase 54  39 - 117 U/L   Total Bilirubin <0.2 (*) 0.3 - 1.2 mg/dL   GFR calc non Af Amer >90  >90 mL/min   GFR calc Af Amer >90  >90 mL/min   Comment: (NOTE)     The eGFR has been calculated using the CKD EPI equation.     This calculation has not been validated in all clinical situations.     eGFR's persistently <90 mL/min signify possible Chronic Kidney  Disease.  PROTIME-INR     Status: None   Collection Time    10/03/13  2:04 PM      Result Value Ref Range   Prothrombin Time 13.5  11.6 - 15.2 seconds   INR 1.05  0.00 - 1.49  HCG, SERUM, QUALITATIVE     Status: None   Collection Time    10/03/13  2:07 PM      Result Value Ref Range   Preg, Serum NEGATIVE  NEGATIVE   Comment:            THE SENSITIVITY OF THIS     METHODOLOGY IS >10 mIU/mL.   Dg Ankle Complete Right  10/03/2013   CLINICAL DATA:  Fall, right ankle pain  EXAM: RIGHT ANKLE - COMPLETE 3+ VIEW  COMPARISON:  None.  FINDINGS: Three views of right ankle submitted. There is displaced oblique fracture of distal right fibula. There is significant disruption of ankle mortise with medial displacement of distal tibia and widening of tibiotalar space medially.  IMPRESSION: Displaced fracture of distal right fibula. Disruption of the ankle mortise with medial displacement of distal tibia per   Electronically Signed   By: Lahoma Crocker M.D.   On: 10/03/2013 11:08    Review of Systems  All other systems reviewed and are negative.    Blood pressure 118/51, pulse 89, temperature 98 F (36.7 C), temperature source Oral, resp. rate 12, last menstrual period 09/26/2013, SpO2 95.00%. Physical Exam  On examination patient's foot is warm she has  good capillary refill the skin has an abrasion but no open fracture. Radiographs show a displaced Weber B. fibular fracture with widened medial joint line Assessment/Plan Assessment: Displaced Weber B. right fibular fracture with a widened  medial joint line.  Plan: We'll plan for open reduction internal fixation. Risks and benefits were discussed including infection neurovascular injury arthritis DVT pulmonary embolus need for additional surgery. Patient states she understands and wished to proceed at this time.  [name] 10/03/2013, 2:58 PM

## 2013-10-03 NOTE — Anesthesia Preprocedure Evaluation (Addendum)
Anesthesia Evaluation  Patient identified by MRN, date of birth, ID band Patient awake    Reviewed: Allergy & Precautions, H&P , NPO status , Patient's Chart, lab work & pertinent test results  History of Anesthesia Complications Negative for: history of anesthetic complications  Airway Mallampati: I  Neck ROM: Full    Dental  (+) Teeth Intact   Pulmonary  breath sounds clear to auscultation        Cardiovascular negative cardio ROS  Rhythm:Regular Rate:Normal     Neuro/Psych  Headaches,    GI/Hepatic negative GI ROS, Neg liver ROS,   Endo/Other  negative endocrine ROS  Renal/GU negative Renal ROS     Musculoskeletal   Abdominal   Peds  Hematology   Anesthesia Other Findings   Reproductive/Obstetrics                          Anesthesia Physical Anesthesia Plan  ASA: I and emergent  Anesthesia Plan: General   Post-op Pain Management:    Induction: Intravenous  Airway Management Planned: Oral ETT  Additional Equipment:   Intra-op Plan:   Post-operative Plan: Extubation in OR  Informed Consent:   Dental advisory given  Plan Discussed with: CRNA and Surgeon  Anesthesia Plan Comments:         Anesthesia Quick Evaluation

## 2013-10-03 NOTE — ED Notes (Signed)
MD at bedside. 

## 2013-10-03 NOTE — ED Provider Notes (Signed)
CSN: [id number]     Arrival date & time 10/03/13  1025 History   First MD Initiated Contact with Patient 10/03/13 1037     Chief Complaint  Patient presents with  . Ankle Injury     (Consider location/radiation/quality/duration/timing/severity/associated sxs/prior Treatment) HPI Comments: 43 year old female presents after falling in the snow. She states her right ankle twisted when she fell. She did not hit her head or neck. She did not lose consciousness. Patient states it looked like her bone was popping out. When asked to clarify the patient says it did not come out of the skin does seem to be bulging. The patient does have a small abrasion to the medial aspect of her ankle but she states she thinks she scraped it while falling. She states the bone that led to not poke out of the spot. The pain is currently a 9/10. She states her foot felt a little numb when EMS put a splint on it since I removed the splint her foot feels normal.   Past Medical History  Diagnosis Date  . Migraine    History reviewed. No pertinent past surgical history. No family history on file. History  Substance Use Topics  . Smoking status: Never Smoker   . Smokeless tobacco: Not on file  . Alcohol Use: Not on file   OB History   Grav Para Term Preterm Abortions TAB SAB Ect Mult Living                 Review of Systems  Musculoskeletal: Positive for joint swelling. Negative for neck pain.  Skin: Positive for wound.  Neurological: Negative for weakness, numbness (while splint was on, none now) and headaches.  All other systems reviewed and are negative.      Allergies  Penicillins  Home Medications   Current Outpatient Rx  Name  Route  Sig  Dispense  Refill  . buPROPion (WELLBUTRIN XL) 150 MG 24 hr tablet   Oral   Take 150 mg by mouth daily.         Marland Kitchen ibuprofen (ADVIL,MOTRIN) 200 MG tablet   Oral   Take 400 mg by mouth every 6 (six) hours as needed for headache or moderate pain.           . Magnesium 500 MG CAPS   Oral   Take 1,000 mg by mouth daily.         . Multiple Vitamins-Minerals (HAIR/SKIN/NAILS PO)   Oral   Take 1 tablet by mouth daily.         . Sodium Chloride-Sodium Bicarb 1.57 G PACK   Nasal   Place 1 each into the nose daily as needed. Congestion         . vitamin B-12 (CYANOCOBALAMIN) 1000 MCG tablet   Oral   Take 5,000 mcg by mouth daily.          BP 112/47  Pulse 70  Temp(Src) 98.4 F (36.9 C)  Resp 20  SpO2 99%  LMP 09/26/2013 Physical Exam  Nursing note and vitals reviewed. Constitutional: She is oriented to person, place, and time. She appears well-developed and well-nourished.  HENT:  Head: Normocephalic and atraumatic.  Right Ear: External ear normal.  Left Ear: External ear normal.  Nose: Nose normal.  Eyes: Right eye exhibits no discharge. Left eye exhibits no discharge.  Cardiovascular: Normal rate, regular rhythm and normal heart sounds.   Pulses:      Dorsalis pedis pulses are 2+ on the right side.  Pulmonary/Chest: Effort normal.  Abdominal: Soft. There is no tenderness.  Musculoskeletal:       Right ankle: She exhibits swelling. She exhibits no deformity and normal pulse. Tenderness.       Feet:  No tenderness to fibular head  Neurological: She is alert and oriented to person, place, and time.  Skin: Skin is warm and dry.    ED Course  Procedures (including critical care time) Labs Review Labs Reviewed - No data to display Imaging Review Dg Ankle Complete Right  10/03/2013   CLINICAL DATA:  Fall, right ankle pain  EXAM: RIGHT ANKLE - COMPLETE 3+ VIEW  COMPARISON:  None.  FINDINGS: Three views of right ankle submitted. There is displaced oblique fracture of distal right fibula. There is significant disruption of ankle mortise with medial displacement of distal tibia and widening of tibiotalar space medially.  IMPRESSION: Displaced fracture of distal right fibula. Disruption of the ankle mortise with medial  displacement of distal tibia per   Electronically Signed   By: Lahoma Crocker M.D.   On: 10/03/2013 11:08    EKG Interpretation   None       MDM   Final diagnoses:  Right fibular fracture    Patient with a right fibular fracture and joint widening as above. The patient is neurovascularly intact. She does have a superficial abrasion on the medial aspect but I do not believe represents an open fracture. Give tetanus update. She was given IM pain medicine. I discussed with Dr. Sharol Given who recommends transferring the patient to [facility] operating room where he will take the patient to the OR.    Ephraim Hamburger, MD 10/03/13 1240

## 2013-10-03 NOTE — Progress Notes (Signed)
Orthopedic Tech Progress Note Patient Details:  [name] 07-06-1971 [id number]  Ortho Devices Type of Ortho Device: CAM walker Ortho Device/Splint Location: RLE Ortho Device/Splint Interventions: Criss Alvine 10/03/2013, 8:14 PM

## 2013-10-03 NOTE — Progress Notes (Signed)
Orthopedic Tech Progress Note Patient Details:  [name] 1970-12-01 [id number]  Ortho Devices Type of Ortho Device: Postop shoe/boot Ortho Device/Splint Location: RLE Ortho Device/Splint Interventions: Ordered;Application   Braulio Bosch 10/03/2013, 8:10 PM

## 2013-10-03 NOTE — Progress Notes (Signed)
Orthopedic Tech Progress Note Patient Details:  [name] 1971/04/27 [id number]  Ortho Devices Type of Ortho Device: CAM walker Ortho Device/Splint Location: RLE Ortho Device/Splint Interventions: Criss Alvine 10/03/2013, 8:13 PM

## 2013-10-04 ENCOUNTER — Encounter (HOSPITAL_COMMUNITY): Payer: Self-pay | Admitting: Orthopedic Surgery

## 2013-10-04 MED ORDER — OXYCODONE-ACETAMINOPHEN 5-325 MG PO TABS: 1.0000 | ORAL_TABLET | ORAL | Status: DC | PRN

## 2013-10-04 MED ORDER — ASPIRIN EC 325 MG PO TBEC: 325.0000 mg | DELAYED_RELEASE_TABLET | Freq: Every day | ORAL | Status: DC

## 2013-10-04 MED ORDER — ALPRAZOLAM 0.25 MG PO TABS
0.2500 mg | ORAL_TABLET | Freq: Four times a day (QID) | ORAL | Status: DC | PRN
  Administered 2013-10-04: 0.25 mg via ORAL
  Filled 2013-10-04: qty 1

## 2013-10-04 NOTE — Evaluation (Signed)
Physical Therapy Evaluation Patient Details Name: [name] MRN: [id number] DOB: Jun 07, 1971 Today's Date: 10/04/2013 Time: 1100-1117 PT Time Calculation (min): 17 min  PT Assessment / Plan / Recommendation History of Present Illness   pt is s/p open reduction internal fixation right distal fibular fracture  Clinical Impression  Pt requires min guard for safety with ambulation, will benefit from skilled PT for balance and mobility training to increase functional independence for safe d/c home.    PT Assessment  Patient needs continued PT services    Follow Up Recommendations  Home health PT;Supervision/Assistance - 24 hour    Does the patient have the potential to tolerate intense rehabilitation      Barriers to Discharge Inaccessible home environment 2 story home    Equipment Recommendations  Rolling walker with 5" wheels;Wheelchair cushion (measurements PT);Wheelchair (measurements PT) (20x18)    Recommendations for Other Services OT consult   Frequency Min 4X/week    Precautions / Restrictions Precautions Precautions: Fall Precaution Comments: cam boot on R LE Restrictions Weight Bearing Restrictions: Yes RLE Weight Bearing: Touchdown weight bearing   Pertinent Vitals/Pain Pt c/o R LE pain with mobility, RN aware      Mobility  Bed Mobility General bed mobility comments: up in chair at PT arrival Transfers Overall transfer level: Needs assistance Equipment used: Rolling walker (2 wheeled) Transfers: Sit to/from Stand Sit to Stand: Min guard General transfer comment: cues for UE placement Ambulation/Gait Ambulation/Gait assistance: Min guard Ambulation Distance (Feet): 30 Feet Assistive device: Rolling walker (2 wheeled) General Gait Details: pt able to maintain TDWB without cuing, no LOB    Exercises     PT Diagnosis: Difficulty walking;Acute pain  PT Problem List: Decreased mobility;Decreased activity tolerance;Pain PT Treatment Interventions:  Gait training;Balance training;DME instruction;Therapeutic exercise;Wheelchair mobility training;Stair training;Neuromuscular re-education;Modalities;Functional mobility training;Therapeutic activities;Patient/family education     PT Goals(Current goals can be found in the care plan section) Acute Rehab PT Goals Patient Stated Goal: go home PT Goal Formulation: With patient Time For Goal Achievement: 10/11/13 Potential to Achieve Goals: Good  Visit Information  Last PT Received On: 10/04/13 Assistance Needed: +1 History of Present Illness:  pt is s/p open reduction internal fixation right distal fibular fracture       Prior [facility] expects to be discharged to:: Private residence Living Arrangements: Alone Available Help at Discharge: Family;Available 24 hours/day (mother to stay for 1 week) Type of Home: House Home Access: Level entry Home Layout: Two level Alternate Level Stairs-Number of Steps: flight Alternate Level Stairs-Rails: None Home Equipment: None Prior Function Level of Independence: Independent Communication Communication: No difficulties    Cognition  Cognition Arousal/Alertness: Awake/alert Behavior During Therapy: WFL for tasks assessed/performed Overall Cognitive Status: Within Functional Limits for tasks assessed    Extremity/Trunk Assessment Upper Extremity Assessment Upper Extremity Assessment: Overall WFL for tasks assessed Lower Extremity Assessment Lower Extremity Assessment: Overall WFL for tasks assessed Cervical / Trunk Assessment Cervical / Trunk Assessment: Normal   Balance    End of Session PT - End of Session Equipment Utilized During Treatment: Gait belt (R cam boot) Activity Tolerance: Patient tolerated treatment well Patient left: in chair;with call bell/phone within reach;with nursing/sitter in room Nurse Communication: Mobility status  GP Functional Assessment Tool Used: clinical  judgement Functional Limitation: Mobility: Walking and moving around Mobility: Walking and Moving Around Current Status ([id number]): At least 20 percent but less than 40 percent impaired, limited or restricted Mobility: Walking and Moving Around Goal Status [phone]): At  least 1 percent but less than 20 percent impaired, limited or restricted   Chai Verdejo 10/04/2013, 11:44 AM

## 2013-10-04 NOTE — Progress Notes (Signed)
DR Sharol Given called per patient request - concerned about low BP (101/59) and no flatus since surgery. Informed side effect of Percocet and may stop taking to take Alleve BID for pain until flatus returns and BP improves. Also may order a Ducolax suppository or Mag Citrate if patient wants to take. Patient informed and verbalizes understanding - she is undecided what she wants to take at present -will check back with patient.

## 2013-10-04 NOTE — Discharge Summary (Signed)
  Discharge to home in stable condition status post open reduction internal fixation right distal fibular fracture. Nonweightbearing right lower extremity prescriptions for aspirin for DVT prophylaxis and Percocet for pain. Followup in the office in one week.

## 2013-10-04 NOTE — Care Management Note (Signed)
CARE MANAGEMENT NOTE 10/04/2013  Patient:  JENAFER, WINTERTON   Account Number:  [id number]  Date Initiated:  10/04/2013  Documentation initiated by:  Ricki Miller  Subjective/Objective Assessment:   43 yr old female s/p right ankle ORIF     Action/Plan:   Patient will need HH PT/OT.Referral called to [facility], [facility] [place]. Case Manager caled Brenton Grills, DME liasion with [facility] for DME.   Anticipated DC Date:  10/04/2013   Anticipated DC Plan:  [place]  CM consult      PAC Choice  [place]   Choice offered to / List presented to:     DME arranged  [place]      DME agency  [place] arranged  HH-3 OT  HH-2 PT      [place].   Status of service:  Completed, signed off Medicare Important Message given?   (If response is "NO", the following Medicare IM given date fields will be blank) Date Medicare IM given:   Date Additional Medicare IM given:    Discharge Disposition:  [place]

## 2013-11-07 ENCOUNTER — Ambulatory Visit: Payer: BC Managed Care – PPO | Attending: Orthopedic Surgery

## 2013-11-07 DIAGNOSIS — M25673 Stiffness of unspecified ankle, not elsewhere classified: Secondary | ICD-10-CM | POA: Insufficient documentation

## 2013-11-07 DIAGNOSIS — IMO0001 Reserved for inherently not codable concepts without codable children: Secondary | ICD-10-CM | POA: Insufficient documentation

## 2013-11-07 DIAGNOSIS — M6281 Muscle weakness (generalized): Secondary | ICD-10-CM | POA: Insufficient documentation

## 2013-11-07 DIAGNOSIS — M25676 Stiffness of unspecified foot, not elsewhere classified: Secondary | ICD-10-CM | POA: Insufficient documentation

## 2013-11-07 DIAGNOSIS — R262 Difficulty in walking, not elsewhere classified: Secondary | ICD-10-CM | POA: Insufficient documentation

## 2013-11-07 DIAGNOSIS — R609 Edema, unspecified: Secondary | ICD-10-CM | POA: Insufficient documentation

## 2013-11-12 ENCOUNTER — Ambulatory Visit: Payer: BC Managed Care – PPO

## 2013-11-14 ENCOUNTER — Ambulatory Visit: Payer: BC Managed Care – PPO

## 2013-11-19 ENCOUNTER — Ambulatory Visit: Payer: BC Managed Care – PPO

## 2013-11-20 ENCOUNTER — Ambulatory Visit: Payer: BC Managed Care – PPO

## 2013-11-26 ENCOUNTER — Ambulatory Visit: Payer: BC Managed Care – PPO

## 2013-11-28 ENCOUNTER — Ambulatory Visit: Payer: BC Managed Care – PPO

## 2013-12-02 ENCOUNTER — Ambulatory Visit: Payer: BC Managed Care – PPO

## 2013-12-04 ENCOUNTER — Ambulatory Visit: Payer: BC Managed Care – PPO

## 2013-12-10 ENCOUNTER — Ambulatory Visit: Payer: BC Managed Care – PPO | Attending: Orthopedic Surgery

## 2013-12-10 DIAGNOSIS — M25676 Stiffness of unspecified foot, not elsewhere classified: Secondary | ICD-10-CM | POA: Insufficient documentation

## 2013-12-10 DIAGNOSIS — Z5189 Encounter for other specified aftercare: Secondary | ICD-10-CM | POA: Insufficient documentation

## 2013-12-10 DIAGNOSIS — M25673 Stiffness of unspecified ankle, not elsewhere classified: Secondary | ICD-10-CM | POA: Insufficient documentation

## 2013-12-10 DIAGNOSIS — R609 Edema, unspecified: Secondary | ICD-10-CM | POA: Insufficient documentation

## 2013-12-10 DIAGNOSIS — R262 Difficulty in walking, not elsewhere classified: Secondary | ICD-10-CM | POA: Insufficient documentation

## 2013-12-10 DIAGNOSIS — M6281 Muscle weakness (generalized): Secondary | ICD-10-CM | POA: Insufficient documentation

## 2013-12-12 ENCOUNTER — Ambulatory Visit: Payer: BC Managed Care – PPO

## 2013-12-19 ENCOUNTER — Ambulatory Visit: Payer: BC Managed Care – PPO

## 2013-12-24 ENCOUNTER — Encounter: Payer: BC Managed Care – PPO | Admitting: Rehabilitation

## 2013-12-26 ENCOUNTER — Encounter: Payer: BC Managed Care – PPO | Admitting: Rehabilitation

## 2014-01-02 ENCOUNTER — Ambulatory Visit: Payer: BC Managed Care – PPO

## 2014-01-16 ENCOUNTER — Ambulatory Visit: Payer: BC Managed Care – PPO | Attending: Orthopedic Surgery

## 2014-01-16 DIAGNOSIS — R262 Difficulty in walking, not elsewhere classified: Secondary | ICD-10-CM | POA: Insufficient documentation

## 2014-01-16 DIAGNOSIS — Z5189 Encounter for other specified aftercare: Secondary | ICD-10-CM | POA: Diagnosis present

## 2014-01-16 DIAGNOSIS — M6281 Muscle weakness (generalized): Secondary | ICD-10-CM | POA: Diagnosis not present

## 2014-01-16 DIAGNOSIS — M25676 Stiffness of unspecified foot, not elsewhere classified: Secondary | ICD-10-CM | POA: Insufficient documentation

## 2014-01-16 DIAGNOSIS — M25673 Stiffness of unspecified ankle, not elsewhere classified: Secondary | ICD-10-CM | POA: Diagnosis not present

## 2014-01-16 DIAGNOSIS — R609 Edema, unspecified: Secondary | ICD-10-CM | POA: Insufficient documentation

## 2014-01-23 ENCOUNTER — Ambulatory Visit: Payer: BC Managed Care – PPO | Admitting: Physical Therapy

## 2014-01-23 DIAGNOSIS — Z5189 Encounter for other specified aftercare: Secondary | ICD-10-CM | POA: Diagnosis not present

## 2014-02-04 ENCOUNTER — Ambulatory Visit: Payer: BC Managed Care – PPO

## 2014-02-04 DIAGNOSIS — Z5189 Encounter for other specified aftercare: Secondary | ICD-10-CM | POA: Diagnosis not present

## 2014-02-25 ENCOUNTER — Ambulatory Visit: Payer: BC Managed Care – PPO | Attending: Orthopedic Surgery

## 2014-02-25 DIAGNOSIS — Z5189 Encounter for other specified aftercare: Secondary | ICD-10-CM | POA: Insufficient documentation

## 2014-02-25 DIAGNOSIS — R262 Difficulty in walking, not elsewhere classified: Secondary | ICD-10-CM | POA: Insufficient documentation

## 2014-02-25 DIAGNOSIS — M25676 Stiffness of unspecified foot, not elsewhere classified: Secondary | ICD-10-CM | POA: Insufficient documentation

## 2014-02-25 DIAGNOSIS — M25673 Stiffness of unspecified ankle, not elsewhere classified: Secondary | ICD-10-CM | POA: Insufficient documentation

## 2014-02-25 DIAGNOSIS — R609 Edema, unspecified: Secondary | ICD-10-CM | POA: Insufficient documentation

## 2014-02-25 DIAGNOSIS — M6281 Muscle weakness (generalized): Secondary | ICD-10-CM | POA: Insufficient documentation

## 2014-02-26 ENCOUNTER — Ambulatory Visit: Payer: BC Managed Care – PPO

## 2014-03-03 ENCOUNTER — Ambulatory Visit: Payer: BC Managed Care – PPO | Admitting: Physical Therapy

## 2014-03-05 ENCOUNTER — Ambulatory Visit: Payer: BC Managed Care – PPO | Admitting: Physical Therapy

## 2015-05-26 ENCOUNTER — Other Ambulatory Visit: Payer: Self-pay | Admitting: Obstetrics and Gynecology

## 2015-06-11 LAB — HM MAMMOGRAPHY

## 2015-06-26 ENCOUNTER — Other Ambulatory Visit: Payer: Self-pay

## 2015-06-26 ENCOUNTER — Encounter (HOSPITAL_COMMUNITY)
Admission: RE | Admit: 2015-06-26 | Discharge: 2015-06-26 | Disposition: A | Discharge status: Home / Self Care | Payer: BLUE CROSS/BLUE SHIELD | Source: Ambulatory Visit | Attending: Obstetrics and Gynecology | Admitting: Obstetrics and Gynecology

## 2015-06-26 ENCOUNTER — Encounter (HOSPITAL_COMMUNITY): Payer: Self-pay

## 2015-06-26 DIAGNOSIS — N920 Excessive and frequent menstruation with regular cycle: Secondary | ICD-10-CM | POA: Insufficient documentation

## 2015-06-26 DIAGNOSIS — Z01818 Encounter for other preprocedural examination: Secondary | ICD-10-CM | POA: Insufficient documentation

## 2015-06-26 DIAGNOSIS — D259 Leiomyoma of uterus, unspecified: Secondary | ICD-10-CM | POA: Insufficient documentation

## 2015-06-26 LAB — COMPREHENSIVE METABOLIC PANEL
ALBUMIN: 3.6 g/dL (ref 3.5–5.0)
ALK PHOS: 58 U/L (ref 38–126)
ALT: 16 U/L (ref 14–54)
ANION GAP: 7 (ref 5–15)
AST: 20 U/L (ref 15–41)
BUN: 12 mg/dL (ref 6–20)
CALCIUM: 8.8 mg/dL — AB (ref 8.9–10.3)
CO2: 27 mmol/L (ref 22–32)
Chloride: 102 mmol/L (ref 101–111)
Creatinine, Ser: 0.91 mg/dL (ref 0.44–1.00)
GFR calc non Af Amer: >60 mL/min (ref >60)
Glucose, Bld: 118 mg/dL — ABNORMAL HIGH (ref 65–99)
Potassium: 3.5 mmol/L (ref 3.5–5.1)
SODIUM: 136 mmol/L (ref 135–145)
TOTAL PROTEIN: 7.8 g/dL (ref 6.5–8.1)
Total Bilirubin: 0.4 mg/dL (ref 0.3–1.2)

## 2015-06-26 LAB — CBC WITH DIFFERENTIAL/PLATELET
BASOS ABS: 0 10*3/uL (ref 0.0–0.1)
BASOS PCT: 1 %
EOS ABS: 0.2 10*3/uL (ref 0.0–0.7)
Eosinophils Relative: 3 %
HCT: 35.9 % — ABNORMAL LOW (ref 36.0–46.0)
HEMOGLOBIN: 11.8 g/dL — AB (ref 12.0–15.0)
Lymphocytes Relative: 36 %
Lymphs Abs: 2.1 10*3/uL (ref 0.7–4.0)
MCH: 28.9 pg (ref 26.0–34.0)
MCHC: 32.9 g/dL (ref 30.0–36.0)
MCV: 88 fL (ref 78.0–100.0)
MONOS PCT: 9 %
Monocytes Absolute: 0.5 10*3/uL (ref 0.1–1.0)
NEUTROS ABS: 3 10*3/uL (ref 1.7–7.7)
NEUTROS PCT: 51 %
Platelets: 378 10*3/uL (ref 150–400)
RBC: 4.08 MIL/uL (ref 3.87–5.11)
RDW: 16.3 % — ABNORMAL HIGH (ref 11.5–15.5)
WBC: 5.7 10*3/uL (ref 4.0–10.5)

## 2015-06-26 NOTE — Patient Instructions (Signed)
Your procedure is scheduled on:  July 06, 2015    Enter through the Main Entrance of [facility] at:  6:00 am   Pick up the phone at the desk and dial [phone].  Call this number if you have problems the morning of surgery: [phone].  Remember: Do NOT eat food: after midnight on Sunday  Do NOT drink clear liquids after: midnight on Sunday  Take these medicines the morning of surgery with a SIP OF WATER:  None   Do NOT wear jewelry (body piercing), metal hair clips/bobby pins, make-up, or nail polish. Do NOT wear lotions, powders, or perfumes.  You may wear deoderant. Do NOT shave for 48 hours prior to surgery. Do NOT bring valuables to the hospital. Contacts, dentures, or bridgework may not be worn into surgery. Leave suitcase in car.  After surgery it may be brought to your room.  For patients admitted to the hospital, checkout time is 11:00 AM the day of discharge.

## 2015-06-28 ENCOUNTER — Encounter (HOSPITAL_COMMUNITY): Payer: Self-pay

## 2015-07-03 NOTE — H&P (Deleted)
[name], [name]             ACCOUNT NO.:  [id number]  MEDICAL RECORD NO.:  [id number]  LOCATION:  [id number]                          FACILITY:  [facility]  PHYSICIAN:  Lucille Passy. Ulanda Edison, M.D. DATE OF BIRTH:  1970-11-16  DATE OF ADMISSION:  07/06/2015 DATE OF DISCHARGE:  07/08/2015                             HISTORY & PHYSICAL   HISTORY OF PRESENT ILLNESS:  This is a 44 year old black female, para 0, who is admitted because of large uterine fibroids, severe menorrhagia, history of anemia and significant pain with ovulation and also difficulty with intercourse, because her partner hit something hard in the vagina, which is the fibroids in the posterior cul-de-sac.  This patient has had previous surgery for fibroids.  She had a myomectomy at that time.  Did fairly well until the last few years when she began to have more trouble with her fibroids.  The uterus was 2 to 2-1/2 times normal size and irregular in 2008, but has gradually enlarged to the size of an 18-20 week size uterus.  She also experiences severe menorrhagia, even though, she does not use many pads a day, she claims to pass large clots and uses giant sized pads and even with that, has difficulty keeping herself clean.  She also has difficulty with intercourse.  She can feel the fibroids in her lower abdomen.  She also had a desire for childbearing and I felt that she should consult with an infertility specialist to see if there is any hope at her age and the huge fibroids of having a conception.  She saw Dr. Kerin Perna, but was not given any hope of having children on her own with her own eggs and also he would have to use an abdominal approach rather than robot for the fibroids to be removed.  She underwent an ultrasound in November 2015, that showed at least 9 fibroids that ranged in size from 2-1/2 x 2- 1/2 cm to 6 x 6 cm.  The ovaries could not be seen because of the dense fibroids.  After consultation with Dr. Kerin Perna,  giving no hope of pregnancy, she felt more and more about having myomectomies or hysterectomy and she decided that she wanted to proceed with hysterectomy.  Because of her previous myomectomies and the significant chance that she could have adhesions of the bowel to the uterus, I asked Dr. Alphonsa Overall to operate with me and he has seen her in preoperative consultation.  PAST MEDICAL HISTORY:  Reveals a history of depression.  She had a therapeutic abortion in 2004, myomectomy in 2002, left hand surgery in 2000.  ALLERGIES:  She has no drug allergies.  SOCIAL HISTORY:  She has never smoked.  She is Sales promotion account executive.  FAMILY HISTORY:  Her maternal aunt had a malignant tumor of the breast, father with coronary artery disease.  Maternal grandfather, heart attack and maternal grandmother diabetes.  PHYSICAL EXAMINATION:  GENERAL:  On admission, this is a well-developed, but very obese black female, in no distress. VITAL SIGNS:  She is 5 feet 4 inches tall and weighs 263.6 pounds with a BMI of 45.2, pulse is 76, and blood pressure is 130/88. HEAD, EYES, NOSE, AND THROAT:  Normal.  NECK:  Supple without thyromegaly. LUNGS:  Clear to auscultation. BREASTS:  Have been examined in the recent past and are normal. HEART:  Normal size and sounds.  No murmurs. LUNGS:  Clear to auscultation. ABDOMEN:  Soft.  It is distended by the uterus, which measures up to the umbilicus.  The vulva and vagina are clean.  The cervix is very difficult to visualize, because it is pushed behind the pubis symphysis by firm fibroids in the posterior cul-de-sac.  The uterine contour is very irregular, enlarged to about 20 week size.  Adnexa cannot be evaluated because of the fibroids.  ADMITTING IMPRESSION:  Leiomyomata uteri, menorrhagia, dyspareunia, pelvic pain.  The patient is admitted for abdominal hysterectomy.  If the ovaries can be preserved, they will be preserved, but the patient has been advised that  ovaries may not be able to be preserved.  She has also been counseled about the history of depression and the increased likelihood of depression in women undergoing hysterectomy.  I have counseled her about this extensively and not encouraged her to have surgery, but she wants to proceed.  The patient has been seen by Dr. Alphonsa Overall.  He has counseled her about the potential for bowel surgery that might prolong her hospitalization.  The patient has been advised of the risks of surgery including, but not limited to wound infection, hemorrhage with need for reoperation and/or transfusion, bowel problems, injury to the urinary tract, bowel obstruction, deep venous thrombosis with pulmonary embolism.  She understands and agrees to proceed.  I will decide on the morning of surgery after consultation with the patient and Dr. Lucia Gaskins about whether to make the incision through the old incision or to make a midline incision.     Lucille Passy. Ulanda Edison, M.D.     TFH/MEDQ  D:  07/03/2015  T:  07/03/2015  Job:  [id number]

## 2015-07-03 NOTE — H&P (Signed)
NAMEEDOM, MUNOS             ACCOUNT NO.:  [id number]  MEDICAL RECORD NO.:  [id number]  LOCATION:  [id number]                          FACILITY:  [facility]  PHYSICIAN:  Lucille Passy. Ulanda Edison, M.D. DATE OF BIRTH:  May 28, 1971  DATE OF ADMISSION:  07/06/2015 DATE OF DISCHARGE:  07/08/2015                             HISTORY & PHYSICAL   HISTORY OF PRESENT ILLNESS:  This is a 44 year old black female, para 0, who is admitted because of large uterine fibroids, severe menorrhagia, history of anemia and significant pain with ovulation and also difficulty with intercourse, because her partner hit something hard in the vagina, which is the fibroids in the posterior cul-de-sac.  This patient has had previous surgery for fibroids.  She had a myomectomy at that time.  Did fairly well until the last few years when she began to have more trouble with her fibroids.  The uterus was 2 to 2-1/2 times normal size and irregular in 2008, but has gradually enlarged to the size of an 18-20 week size uterus.  She also experiences severe menorrhagia, even though, she does not use many pads a day, she claims to pass large clots and uses giant sized pads and even with that, has difficulty keeping herself clean.  She also has difficulty with intercourse.  She can feel the fibroids in her lower abdomen.  She also had a desire for childbearing and I felt that she should consult with an infertility specialist to see if there is any hope at her age and the huge fibroids of having a conception.  She saw Dr. Kerin Perna, but was not given any hope of having children on her own with her own eggs and also he would have to use an abdominal approach rather than robot for the fibroids to be removed.  She underwent an ultrasound in November 2015, that showed at least 9 fibroids that ranged in size from 2-1/2 x 2- 1/2 cm to 6 x 6 cm.  The ovaries could not be seen because of the dense fibroids.  After consultation with Dr. Kerin Perna,  giving no hope of pregnancy, she felt more and more about having myomectomies or hysterectomy and she decided that she wanted to proceed with hysterectomy.  Because of her previous myomectomies and the significant chance that she could have adhesions of the bowel to the uterus, I asked Dr. Alphonsa Overall to operate with me and he has seen her in preoperative consultation.  PAST MEDICAL HISTORY:  Reveals a history of depression.  She had a therapeutic abortion in 2004, myomectomy in 2002, left hand surgery in 2000.  ALLERGIES:  She has no drug allergies.  SOCIAL HISTORY:  She has never smoked.  She is Sales promotion account executive.  FAMILY HISTORY:  Her maternal aunt had a malignant tumor of the breast, father with coronary artery disease.  Maternal grandfather, heart attack and maternal grandmother diabetes.  PHYSICAL EXAMINATION:  GENERAL:  On admission, this is a well-developed, but very obese black female, in no distress. VITAL SIGNS:  She is 5 feet 4 inches tall and weighs 263.6 pounds with a BMI of 45.2, pulse is 76, and blood pressure is 130/88. HEAD, EYES, NOSE, AND THROAT:  Normal.  NECK:  Supple without thyromegaly. LUNGS:  Clear to auscultation. BREASTS:  Have been examined in the recent past and are normal. HEART:  Normal size and sounds.  No murmurs. LUNGS:  Clear to auscultation. ABDOMEN:  Soft.  It is distended by the uterus, which measures up to the umbilicus.  The vulva and vagina are clean.  The cervix is very difficult to visualize, because it is pushed behind the pubis symphysis by firm fibroids in the posterior cul-de-sac.  The uterine contour is very irregular, enlarged to about 20 week size.  Adnexa cannot be evaluated because of the fibroids.  ADMITTING IMPRESSION:  Leiomyomata uteri, menorrhagia, dyspareunia, pelvic pain.  The patient is admitted for abdominal hysterectomy.  If the ovaries can be preserved, they will be preserved, but the patient has been advised that  ovaries may not be able to be preserved.  She has also been counseled about the history of depression and the increased likelihood of depression in women undergoing hysterectomy.  I have counseled her about this extensively and not encouraged her to have surgery, but she wants to proceed.  The patient has been seen by Dr. Alphonsa Overall.  He has counseled her about the potential for bowel surgery that might prolong her hospitalization.  The patient has been advised of the risks of surgery including, but not limited to wound infection, hemorrhage with need for reoperation and/or transfusion, bowel problems, injury to the urinary tract, bowel obstruction, deep venous thrombosis with pulmonary embolism.  She understands and agrees to proceed.  I will decide on the morning of surgery after consultation with the patient and Dr. Lucia Gaskins about whether to make the incision through the old incision or to make a midline incision.     Lucille Passy. Ulanda Edison, M.D.     TFH/MEDQ  D:  07/03/2015  T:  07/03/2015  Job:  [id number]

## 2015-07-04 NOTE — Anesthesia Preprocedure Evaluation (Addendum)
Anesthesia Evaluation  Patient identified by MRN, date of birth, ID band Patient awake    Reviewed: Allergy & Precautions, NPO status , Patient's Chart, lab work & pertinent test results  Airway Mallampati: III       Dental no notable dental hx. (+) Teeth Intact   Pulmonary neg pulmonary ROS,    Pulmonary exam normal breath sounds clear to auscultation       Cardiovascular Normal cardiovascular exam Rhythm:Regular Rate:Normal     Neuro/Psych  Headaches, PSYCHIATRIC DISORDERS Anxiety Depression    GI/Hepatic Neg liver ROS,   Endo/Other  Morbid obesity  Renal/GU negative Renal ROS  negative genitourinary   Musculoskeletal negative musculoskeletal ROS (+)   Abdominal (+) + obese,   Peds  Hematology  (+) anemia ,   Anesthesia Other Findings   Reproductive/Obstetrics Fibroid uterus Menorrhagia                             Anesthesia Physical Anesthesia Plan  ASA: III  Anesthesia Plan: General   Post-op Pain Management:    Induction: Intravenous  Airway Management Planned: Oral ETT  Additional Equipment:   Intra-op Plan:   Post-operative Plan: Extubation in OR  Informed Consent: I have reviewed the patients History and Physical, chart, labs and discussed the procedure including the risks, benefits and alternatives for the proposed anesthesia with the patient or authorized representative who has indicated his/her understanding and acceptance.   Dental advisory given  Plan Discussed with: CRNA, Anesthesiologist and Surgeon  Anesthesia Plan Comments:         Anesthesia Quick Evaluation

## 2015-07-06 ENCOUNTER — Encounter (HOSPITAL_COMMUNITY)
Admission: RE | Disposition: A | Discharge status: Home / Self Care | Payer: Self-pay | Source: Ambulatory Visit | Attending: Obstetrics and Gynecology

## 2015-07-06 ENCOUNTER — Inpatient Hospital Stay (HOSPITAL_COMMUNITY): Payer: BLUE CROSS/BLUE SHIELD | Admitting: Anesthesiology

## 2015-07-06 ENCOUNTER — Encounter (HOSPITAL_COMMUNITY): Payer: Self-pay

## 2015-07-06 ENCOUNTER — Inpatient Hospital Stay (HOSPITAL_COMMUNITY)
Admission: RE | Admit: 2015-07-06 | Discharge: 2015-07-08 | DRG: 743 | Disposition: A | Discharge status: Home / Self Care | Payer: BLUE CROSS/BLUE SHIELD | Source: Ambulatory Visit | Attending: Obstetrics and Gynecology | Admitting: Obstetrics and Gynecology

## 2015-07-06 DIAGNOSIS — D259 Leiomyoma of uterus, unspecified: Secondary | ICD-10-CM

## 2015-07-06 DIAGNOSIS — D251 Intramural leiomyoma of uterus: Secondary | ICD-10-CM

## 2015-07-06 DIAGNOSIS — D649 Anemia, unspecified: Secondary | ICD-10-CM | POA: Diagnosis present

## 2015-07-06 DIAGNOSIS — N736 Female pelvic peritoneal adhesions (postinfective): Secondary | ICD-10-CM | POA: Diagnosis present

## 2015-07-06 DIAGNOSIS — N920 Excessive and frequent menstruation with regular cycle: Secondary | ICD-10-CM | POA: Diagnosis present

## 2015-07-06 HISTORY — PX: ABDOMINAL HYSTERECTOMY: SHX81

## 2015-07-06 HISTORY — DX: Leiomyoma of uterus, unspecified: D25.9

## 2015-07-06 LAB — CREATININE, SERUM: CREATININE: 0.9 mg/dL (ref 0.44–1.00)

## 2015-07-06 LAB — CBC
HCT: 34.4 % — ABNORMAL LOW (ref 36.0–46.0)
HEMOGLOBIN: 11.3 g/dL — AB (ref 12.0–15.0)
MCH: 29 pg (ref 26.0–34.0)
MCHC: 32.8 g/dL (ref 30.0–36.0)
MCV: 88.2 fL (ref 78.0–100.0)
PLATELETS: 335 10*3/uL (ref 150–400)
RBC: 3.9 MIL/uL (ref 3.87–5.11)
RDW: 16.4 % — ABNORMAL HIGH (ref 11.5–15.5)
WBC: 13 10*3/uL — AB (ref 4.0–10.5)

## 2015-07-06 LAB — ABO/RH: ABO/RH(D): AB POS

## 2015-07-06 LAB — TYPE AND SCREEN
ABO/RH(D): AB POS
Antibody Screen: NEGATIVE

## 2015-07-06 LAB — PREGNANCY, URINE: PREG TEST UR: NEGATIVE

## 2015-07-06 SURGERY — HYSTERECTOMY, ABDOMINAL
Anesthesia: General

## 2015-07-06 MED ORDER — HYDROMORPHONE 1 MG/ML IV SOLN
INTRAVENOUS | Status: DC
Start: 2015-07-06 — End: 2015-07-07
  Administered 2015-07-06: 3.6 mg via INTRAVENOUS
  Administered 2015-07-06: 13:00:00 via INTRAVENOUS
  Administered 2015-07-06: 1.8 mg via INTRAVENOUS
  Administered 2015-07-07: 0.8 mg via INTRAVENOUS
  Administered 2015-07-07: 1.6 mg via INTRAVENOUS
  Administered 2015-07-07: 0.8 mg via INTRAVENOUS
  Filled 2015-07-06: qty 25

## 2015-07-06 MED ORDER — HYDROMORPHONE HCL 1 MG/ML IJ SOLN
0.2500 mg | INTRAMUSCULAR | Status: DC | PRN
  Administered 2015-07-06 (×6): 0.25 mg via INTRAVENOUS

## 2015-07-06 MED ORDER — PROPOFOL 10 MG/ML IV BOLUS
INTRAVENOUS | Status: AC
  Filled 2015-07-06: qty 20

## 2015-07-06 MED ORDER — ROCURONIUM BROMIDE 100 MG/10ML IV SOLN
INTRAVENOUS | Status: DC | PRN
  Administered 2015-07-06: 10 mg via INTRAVENOUS
  Administered 2015-07-06: 50 mg via INTRAVENOUS
  Administered 2015-07-06: 10 mg via INTRAVENOUS

## 2015-07-06 MED ORDER — MIDAZOLAM HCL 2 MG/2ML IJ SOLN
INTRAMUSCULAR | Status: DC | PRN
  Administered 2015-07-06: 1.5 mg via INTRAVENOUS
  Administered 2015-07-06: 0.5 mg via INTRAVENOUS

## 2015-07-06 MED ORDER — ONDANSETRON HCL 4 MG/2ML IJ SOLN
INTRAMUSCULAR | Status: DC | PRN
  Administered 2015-07-06 (×2): 2 mg via INTRAVENOUS

## 2015-07-06 MED ORDER — LIDOCAINE HCL (CARDIAC) 20 MG/ML IV SOLN
INTRAVENOUS | Status: AC
  Filled 2015-07-06: qty 5

## 2015-07-06 MED ORDER — SCOPOLAMINE 1 MG/3DAYS TD PT72
MEDICATED_PATCH | TRANSDERMAL | Status: AC
  Administered 2015-07-06: 1.5 mg via TRANSDERMAL
  Filled 2015-07-06: qty 1

## 2015-07-06 MED ORDER — NALOXONE HCL 0.4 MG/ML IJ SOLN: 0.4000 mg | INTRAMUSCULAR | Status: DC | PRN

## 2015-07-06 MED ORDER — ROCURONIUM BROMIDE 100 MG/10ML IV SOLN
INTRAVENOUS | Status: AC
  Filled 2015-07-06: qty 1

## 2015-07-06 MED ORDER — ONDANSETRON HCL 4 MG/2ML IJ SOLN
INTRAMUSCULAR | Status: AC
  Filled 2015-07-06: qty 2

## 2015-07-06 MED ORDER — DIPHENHYDRAMINE HCL 50 MG/ML IJ SOLN: 12.5000 mg | Freq: Four times a day (QID) | INTRAMUSCULAR | Status: DC | PRN

## 2015-07-06 MED ORDER — ONDANSETRON HCL 4 MG/2ML IJ SOLN: 4.0000 mg | Freq: Four times a day (QID) | INTRAMUSCULAR | Status: DC | PRN

## 2015-07-06 MED ORDER — LACTATED RINGERS IV SOLN
INTRAVENOUS | Status: DC
  Administered 2015-07-06 (×3): via INTRAVENOUS

## 2015-07-06 MED ORDER — NEOSTIGMINE METHYLSULFATE 10 MG/10ML IV SOLN
INTRAVENOUS | Status: AC
  Filled 2015-07-06: qty 1

## 2015-07-06 MED ORDER — METOCLOPRAMIDE HCL 5 MG/ML IJ SOLN: 10.0000 mg | Freq: Once | INTRAMUSCULAR | Status: DC | PRN

## 2015-07-06 MED ORDER — HYDROMORPHONE HCL 1 MG/ML IJ SOLN
INTRAMUSCULAR | Status: AC
  Administered 2015-07-06: 0.25 mg via INTRAVENOUS
  Filled 2015-07-06: qty 1

## 2015-07-06 MED ORDER — DEXAMETHASONE SODIUM PHOSPHATE 10 MG/ML IJ SOLN
INTRAMUSCULAR | Status: DC | PRN
  Administered 2015-07-06: 10 mg via INTRAVENOUS

## 2015-07-06 MED ORDER — FENTANYL CITRATE (PF) 100 MCG/2ML IJ SOLN
INTRAMUSCULAR | Status: DC | PRN
  Administered 2015-07-06: 100 ug via INTRAVENOUS
  Administered 2015-07-06: 50 ug via INTRAVENOUS
  Administered 2015-07-06: 100 ug via INTRAVENOUS

## 2015-07-06 MED ORDER — DEXAMETHASONE SODIUM PHOSPHATE 10 MG/ML IJ SOLN
INTRAMUSCULAR | Status: AC
  Filled 2015-07-06: qty 1

## 2015-07-06 MED ORDER — HYDROMORPHONE HCL 1 MG/ML IJ SOLN
INTRAMUSCULAR | Status: DC | PRN
  Administered 2015-07-06 (×2): 1 mg via INTRAVENOUS

## 2015-07-06 MED ORDER — GLYCOPYRROLATE 0.2 MG/ML IJ SOLN
INTRAMUSCULAR | Status: AC
  Filled 2015-07-06: qty 3

## 2015-07-06 MED ORDER — LACTATED RINGERS IV SOLN
INTRAVENOUS | Status: DC
  Administered 2015-07-06 – 2015-07-07 (×3): via INTRAVENOUS

## 2015-07-06 MED ORDER — OXYCODONE-ACETAMINOPHEN 5-325 MG PO TABS
1.0000 | ORAL_TABLET | Freq: Four times a day (QID) | ORAL | Status: DC | PRN
  Administered 2015-07-07 (×2): 1 via ORAL
  Filled 2015-07-06 (×2): qty 1

## 2015-07-06 MED ORDER — CEFAZOLIN SODIUM-DEXTROSE 2-3 GM-% IV SOLR
2.0000 g | Freq: Once | INTRAVENOUS | Status: DC
  Filled 2015-07-06: qty 50

## 2015-07-06 MED ORDER — FENTANYL CITRATE (PF) 250 MCG/5ML IJ SOLN
INTRAMUSCULAR | Status: AC
  Filled 2015-07-06: qty 5

## 2015-07-06 MED ORDER — SCOPOLAMINE 1 MG/3DAYS TD PT72
1.0000 | MEDICATED_PATCH | Freq: Once | TRANSDERMAL | Status: DC
  Administered 2015-07-06: 1.5 mg via TRANSDERMAL

## 2015-07-06 MED ORDER — ONDANSETRON HCL 4 MG PO TABS: 4.0000 mg | ORAL_TABLET | Freq: Four times a day (QID) | ORAL | Status: DC | PRN

## 2015-07-06 MED ORDER — LIDOCAINE HCL (CARDIAC) 20 MG/ML IV SOLN
INTRAVENOUS | Status: DC | PRN
  Administered 2015-07-06: 100 mg via INTRAVENOUS

## 2015-07-06 MED ORDER — PROPOFOL 10 MG/ML IV BOLUS
INTRAVENOUS | Status: DC | PRN
  Administered 2015-07-06: 200 mg via INTRAVENOUS

## 2015-07-06 MED ORDER — MIDAZOLAM HCL 2 MG/2ML IJ SOLN
INTRAMUSCULAR | Status: AC
  Filled 2015-07-06: qty 2

## 2015-07-06 MED ORDER — NEOSTIGMINE METHYLSULFATE 10 MG/10ML IV SOLN
INTRAVENOUS | Status: DC | PRN
  Administered 2015-07-06: 3 mg via INTRAVENOUS

## 2015-07-06 MED ORDER — SODIUM CHLORIDE 0.9 % IJ SOLN: 9.0000 mL | INTRAMUSCULAR | Status: DC | PRN

## 2015-07-06 MED ORDER — DIPHENHYDRAMINE HCL 12.5 MG/5ML PO ELIX: 12.5000 mg | ORAL_SOLUTION | Freq: Four times a day (QID) | ORAL | Status: DC | PRN

## 2015-07-06 MED ORDER — GLYCOPYRROLATE 0.2 MG/ML IJ SOLN
INTRAMUSCULAR | Status: DC | PRN
  Administered 2015-07-06: .1 mg via INTRAVENOUS
  Administered 2015-07-06: 0.4 mg via INTRAVENOUS
  Administered 2015-07-06: .1 mg via INTRAVENOUS

## 2015-07-06 MED ORDER — DEXTROSE 5 % IV SOLN
3.0000 g | INTRAVENOUS | Status: AC
  Administered 2015-07-06: 3 g via INTRAVENOUS
  Filled 2015-07-06: qty 3000

## 2015-07-06 MED ORDER — ENOXAPARIN SODIUM 40 MG/0.4ML ~~LOC~~ SOLN
40.0000 mg | SUBCUTANEOUS | Status: DC
  Administered 2015-07-07 – 2015-07-08 (×2): 40 mg via SUBCUTANEOUS
  Filled 2015-07-06 (×3): qty 0.4

## 2015-07-06 MED ORDER — HYDROMORPHONE HCL 1 MG/ML IJ SOLN
INTRAMUSCULAR | Status: AC
  Filled 2015-07-06: qty 2

## 2015-07-06 MED ORDER — MEPERIDINE HCL 25 MG/ML IJ SOLN: 6.2500 mg | INTRAMUSCULAR | Status: DC | PRN

## 2015-07-06 MED ORDER — ENOXAPARIN SODIUM 40 MG/0.4ML ~~LOC~~ SOLN
40.0000 mg | SUBCUTANEOUS | Status: AC
  Administered 2015-07-06: 40 mg via SUBCUTANEOUS
  Filled 2015-07-06: qty 0.4

## 2015-07-06 MED ORDER — KETOROLAC TROMETHAMINE 30 MG/ML IJ SOLN
INTRAMUSCULAR | Status: AC
  Filled 2015-07-06: qty 1

## 2015-07-06 SURGICAL SUPPLY — 49 items
CANISTER SUCT 3000ML (MISCELLANEOUS) ×3 IMPLANT
CLOTH BEACON ORANGE TIMEOUT ST (SAFETY) ×3 IMPLANT
CONT PATH 16OZ SNAP LID 3702 (MISCELLANEOUS) ×3 IMPLANT
DECANTER SPIKE VIAL GLASS SM (MISCELLANEOUS) IMPLANT
DRAPE LAPAROTOMY T 102X78X121 (DRAPES) ×2 IMPLANT
DRAPE WARM FLUID 44X44 (DRAPE) ×2 IMPLANT
DRSG OPSITE POSTOP 4X10 (GAUZE/BANDAGES/DRESSINGS) ×3 IMPLANT
DRSG VASELINE 3X18 (GAUZE/BANDAGES/DRESSINGS) ×3 IMPLANT
DURAPREP 26ML APPLICATOR (WOUND CARE) ×3 IMPLANT
ELECT BLADE 6.5 EXT (BLADE) ×2 IMPLANT
GAUZE SPONGE 4X4 16PLY XRAY LF (GAUZE/BANDAGES/DRESSINGS) IMPLANT
GAUZE VASELINE 3X9 (GAUZE/BANDAGES/DRESSINGS) ×2 IMPLANT
GLOVE BIO SURGEON STRL SZ7 (GLOVE) ×4 IMPLANT
GLOVE BIO SURGEON STRL SZ7.5 (GLOVE) ×3 IMPLANT
GLOVE BIOGEL PI IND STRL 7.0 (GLOVE) ×6 IMPLANT
GLOVE BIOGEL PI IND STRL 7.5 (GLOVE) ×2 IMPLANT
GLOVE BIOGEL PI INDICATOR 7.0 (GLOVE) ×3
GLOVE BIOGEL PI INDICATOR 7.5 (GLOVE) ×2
GLOVE SURG SIGNA 7.5 PF LTX (GLOVE) ×4 IMPLANT
GOWN STRL REUS W/TWL LRG LVL3 (GOWN DISPOSABLE) ×6 IMPLANT
NS IRRIG 1000ML POUR BTL (IV SOLUTION) ×7 IMPLANT
PACK ABDOMINAL GYN (CUSTOM PROCEDURE TRAY) ×3 IMPLANT
PAD ABD 7.5X8 STRL (GAUZE/BANDAGES/DRESSINGS) ×2 IMPLANT
PAD OB MATERNITY 4.3X12.25 (PERSONAL CARE ITEMS) ×3 IMPLANT
PENCIL SMOKE EVAC W/HOLSTER (ELECTROSURGICAL) ×3 IMPLANT
RETRACTOR WND ALEXIS 25 LRG (MISCELLANEOUS) IMPLANT
RTRCTR WOUND ALEXIS 25CM LRG (MISCELLANEOUS)
SLEEVE SURGEON STRL (DRAPES) ×2 IMPLANT
SPONGE GAUZE 4X4 12PLY STER LF (GAUZE/BANDAGES/DRESSINGS) ×3 IMPLANT
SPONGE LAP 18X18 X RAY DECT (DISPOSABLE) ×8 IMPLANT
SPONGE LAP 4X18 X RAY DECT (DISPOSABLE) ×3 IMPLANT
STAPLER VISISTAT 35W (STAPLE) ×5 IMPLANT
SUT NOVA NAB DX-16 0-1 5-0 T12 (SUTURE) ×6 IMPLANT
SUT PDS AB 0 CTX 60 (SUTURE) IMPLANT
SUT VIC AB 0 CT1 18XCR BRD8 (SUTURE) ×8 IMPLANT
SUT VIC AB 0 CT1 27 (SUTURE) ×9
SUT VIC AB 0 CT1 27XBRD ANBCTR (SUTURE) ×6 IMPLANT
SUT VIC AB 0 CT1 8-18 (SUTURE) ×15
SUT VIC AB 2-0 CT1 18 (SUTURE) ×2 IMPLANT
SUT VIC AB 2-0 SH 27 (SUTURE) ×6
SUT VIC AB 2-0 SH 27XBRD (SUTURE) ×2 IMPLANT
SUT VIC AB 3-0 CTX 36 (SUTURE) ×3 IMPLANT
SUT VIC AB 3-0 SH 27 (SUTURE)
SUT VIC AB 3-0 SH 27X BRD (SUTURE) IMPLANT
SUT VICRYL 0 TIES 12 18 (SUTURE) ×3 IMPLANT
TAPE CLOTH SURG 4X10 WHT LF (GAUZE/BANDAGES/DRESSINGS) ×2 IMPLANT
TOWEL OR 17X24 6PK STRL BLUE (TOWEL DISPOSABLE) ×6 IMPLANT
TRAY FOLEY CATH SILVER 14FR (SET/KITS/TRAYS/PACK) ×3 IMPLANT
WATER STERILE IRR 1000ML POUR (IV SOLUTION) ×1 IMPLANT

## 2015-07-06 NOTE — Progress Notes (Signed)
Patient ID: [name], female   DOB: Apr 23, 1971, 44 y.o.   MRN: [id number] I examined this lady 07-01-15 and she reports no change in her health since that time.

## 2015-07-06 NOTE — Addendum Note (Signed)
Addendum  created 07/06/15 1712 by Jonna Munro, CRNA   Modules edited: Clinical Notes   Clinical Notes:  File: [id number]

## 2015-07-06 NOTE — Progress Notes (Signed)
Day of Surgery Procedure(s) (LRB): HYSTERECTOMY ABDOMINALwith removal of portion of left ovary (N/A)  Subjective: Patient reports incisional pain that is not completely resolved by meds, but is not hitting PCA too often.  No N/V, tolerating ice chips.     Objective: I have reviewed patient's vital signs and intake and output.  UOP excellent, clear urine draining Labs this PM WNL  General: alert and cooperative GI: soft NT  Assessment: s/p Procedure(s) with comments: HYSTERECTOMY ABDOMINALwith removal of portion of left ovary (N/A) - Needs #1 Novafil Popoffs if Midline incision: stable  Plan: Clear liquids for now D/w pt hitting PCA regularly until pain more controlled.   LOS: 0 days    [name] W 07/06/2015, 5:59 PM

## 2015-07-06 NOTE — Anesthesia Postprocedure Evaluation (Signed)
Anesthesia Post Note  Patient: [name]  Procedure(s) Performed: Procedure(s) (LRB): HYSTERECTOMY ABDOMINALwith removal of portion of left ovary (N/A)  Patient location during evaluation: [facility] Anesthesia Type: General Level of consciousness: awake and alert Pain management: pain level controlled Vital Signs Assessment: post-procedure vital signs reviewed and stable Respiratory status: spontaneous breathing Cardiovascular status: stable Postop Assessment: No signs of nausea or vomiting and Adequate PO intake Anesthetic complications: no    Last Vitals:  Filed Vitals:   07/06/15 1350 07/06/15 1550  BP: 122/70 137/72  Pulse: 88 76  Temp: 36.9 C 36.4 C  Resp: 14 12    Last Pain:  Filed Vitals:   07/06/15 1703  PainSc: 8                  Stormi Vandevelde

## 2015-07-06 NOTE — Transfer of Care (Signed)
Immediate Anesthesia Transfer of Care Note  Patient: RHYTHM RICOTTA  Procedure(s) Performed: Procedure(s) with comments: HYSTERECTOMY ABDOMINALwith removal of portion of left ovary (N/A) - Needs #1 Novafil Popoffs if Midline incision  Patient Location: PACU  Anesthesia Type:General  Level of Consciousness: awake, alert  and oriented  Airway & Oxygen Therapy: Patient Spontanous Breathing and Patient connected to face mask oxygen  Post-op Assessment: Report given to RN, Post -op Vital signs reviewed and stable and Patient moving all extremities X 4  Post vital signs: Reviewed and stable  Last Vitals:  Filed Vitals:   07/06/15 0618  BP: 126/75  Pulse: 73  Temp: 37.1 C  Resp: 22    Complications: No apparent anesthesia complications

## 2015-07-06 NOTE — Progress Notes (Signed)
Patient ID: [name], female   DOB: Apr 06, 1971, 44 y.o.   MRN: [id number] Op note:  Pre and post op dx: fibroids, menorrhagia, anemia, pelvic pain, and adhesions from prior myomectomies Op: TAH, division of dense adhesions of uterus to bowel and adnexa  OP: Carlisle Beers  General anesthesia  EBL 800 cc's.

## 2015-07-06 NOTE — Op Note (Signed)
NAMECLARISSE, DEGENNARO             ACCOUNT NO.:  [id number]  MEDICAL RECORD NO.:  [id number]  LOCATION:  [id number]                          FACILITY:  [facility]  PHYSICIAN:  Lucille Passy. Ulanda Edison, M.D. DATE OF BIRTH:  1970/12/24  DATE OF PROCEDURE:  07/06/2015 DATE OF DISCHARGE:                              OPERATIVE REPORT   PREOPERATIVE DIAGNOSIS:  Large leiomyomata uteri, probable adhesions from prior myomectomies, menorrhagia, anemia, pelvic pain.  POSTOPERATIVE DIAGNOSIS:  Large leiomyomata uteri, probable adhesions from prior myomectomies, menorrhagia, anemia, pelvic pain with dense adhesions of the adnexa to the uterus and of the bowel to the uterus.  OPERATIONS:  Abdominal hysterectomy, lysis of adhesions, removal of part of the left ovary.  OPERATORS:  Lucille Passy. Ulanda Edison, M.D. and Fenton Malling. Lucia Gaskins, M.D.  ANESTHESIA:  General anesthesia.  DESCRIPTION OF PROCEDURE:  This patient was brought to the operating room and placed under satisfactory general anesthesia and placed in frog- leg position.  The abdomen was prepped with Betadine solution for a midline incision.  The vulva, vagina and perineum were prepped with Betadine solution.  The urethra was prepped with Betadine and a Foley catheter was inserted to straight drain.  Exam had been done prior to prep and Dr. Lucia Gaskins and I both agreed that a midline incision was the proper route to enter the abdominal cavity because the likelihood of adhesions was great and with a transverse incision, we might not be able to bring the uterus into full view.The fundus extended to the umbilicus.   After the area was draped as a sterile field and the patient was placed supine, a time-out was done identifying the patient, the procedure to be done and the members of the operating team.  A midline incision was made from the pubis to the umbilicus, carried in layers through the skin, subcutaneous tissue and fascia.  The posterior rectus sheath was identified  and opened, and the peritoneum was entered.  It was apparent on entering the abdominal cavity that the uterus was quite enlarged and extended to the umbilicus. However, after the incision was enlarged, the uterus could not be delivered through the abdominal incision. Adhesions that were apparent were from the sigmoid colon to the fundus of the uterus and slightly posterior to the fundus.  Dr. Lucia Gaskins lysed these adhesions, but even after lysing these adhesions, the uterus was not free.  This was mainly caused by bilateral scarring of the adnexa into the uterus itself.  These adhesions were taken down and after these adhesions were lysed, it was possible to flip the uterus up through the abdominal incision with the multiple fibroids.  At this point, both upper pedicles were clamped across and divided and suture ligated. Going down the sides of the uterus with sharp dissection, the uterine vessels were encountered, divided and suture ligated.  The bladder was pushed inferiorly by cutting across the peritoneum over the lower uterine segment.  Additional bites were taken parametrially, cut and suture ligated with 0 Vicryl.  The uterosacral ligaments were encountered, clamped, cut and suture ligated and then both vaginal angles were entered and the uterus was removed by transecting the upper vagina.  Bilateral vaginal angle sutures were placed  and then the central portion of the vagina was closed with interrupted figure-of- eight sutures of 0 Vicryl.The uterosacral ligaments were sutured together in the midline.  At this point, it appeared that hemostasis was good and blood loss was about 700 mL.  However, on additional inspection and after thorough irrigation, multiple bleeders were noted mainly from what we  thought was the left infundibulopelvic serosa and from peritoneal edges and from adhesions that had been lysed.  What was thought to be the left ovary was also bleeding and ultimately  I actually removed part of what I thought was the left ovary to secure complete hemostasis.  At this point, it did appear that hemostasis was complete.  We never used a self-retaining retractor.  We removed all Packs.Dr Lucia Gaskins inspected all areas of the bowel that had been released from the uterus and found no evidence of bowel injury.I closed the abdominal wall with interrupted sutures of #1 Novafil figure-of-eight sutures on the fascia. At times, I incorporated part of the peritoneum, but the peritoneum was  not included in every suture.  After I placed the figure-of-eight sutures of #1 Novafil through the fascia, there were several other places where the fascia appeared to have some gaps and I closed these with additional sutures of #1 Novafil.  The subcutaneous tissue was closed with a running suture of 3-0 Vicryl and the skin was closed with automatic staples.  The upper abdomen was not explored.  The patient seemed to tolerate the procedure well.  The blood loss was estimated at 800 mL. Sponge and needle counts were correct, and the patient was returned to recovery in satisfactory condition.     Lucille Passy. Ulanda Edison, M.D.     TFH/MEDQ  D:  07/06/2015  T:  07/06/2015  Job:  [id number]  cc:   Fenton Malling. Lucia Gaskins, M.D. [id number] [address] [place] [address]

## 2015-07-06 NOTE — Anesthesia Procedure Notes (Signed)
Procedure Name: Intubation Date/Time: 07/06/2015 7:30 AM Performed by: Mayer Camel, Albert Hersch A Pre-anesthesia Checklist: Patient identified, Emergency Drugs available, Suction available and Patient being monitored Patient Re-evaluated:Patient Re-evaluated prior to inductionOxygen Delivery Method: Circle system utilized Preoxygenation: Pre-oxygenation with 100% oxygen Intubation Type: IV induction Ventilation: Mask ventilation without difficulty Laryngoscope Size: Miller and 2 Grade View: Grade II Tube type: Oral Tube size: 7.0 mm Number of attempts: 1 Placement Confirmation: ETT inserted through vocal cords under direct vision,  positive ETCO2 and breath sounds checked- equal and bilateral Secured at: 21 cm Tube secured with: Tape

## 2015-07-06 NOTE — OR Nursing (Signed)
Weight of specimen 1082.1 gms.

## 2015-07-06 NOTE — Anesthesia Postprocedure Evaluation (Signed)
Anesthesia Post Note  Patient: [name]  Procedure(s) Performed: Procedure(s) (LRB): HYSTERECTOMY ABDOMINALwith removal of portion of left ovary (N/A)  Patient location during evaluation: PACU Anesthesia Type: General Level of consciousness: awake and alert Pain management: pain level controlled Vital Signs Assessment: post-procedure vital signs reviewed and stable Respiratory status: spontaneous breathing, nonlabored ventilation, respiratory function stable and patient connected to nasal cannula oxygen Cardiovascular status: blood pressure returned to baseline and stable Postop Assessment: No signs of nausea or vomiting Anesthetic complications: no    Last Vitals:  Filed Vitals:   07/06/15 1145 07/06/15 1159  BP: 153/80   Pulse: 70 74  Temp:    Resp: 16 16    Last Pain:  Filed Vitals:   07/06/15 1159  PainSc: 9                  [name]

## 2015-07-07 ENCOUNTER — Encounter (HOSPITAL_COMMUNITY): Payer: Self-pay | Admitting: Obstetrics and Gynecology

## 2015-07-07 LAB — CBC
HCT: 29.1 % — ABNORMAL LOW (ref 36.0–46.0)
Hemoglobin: 9.5 g/dL — ABNORMAL LOW (ref 12.0–15.0)
MCH: 28.8 pg (ref 26.0–34.0)
MCHC: 32.6 g/dL (ref 30.0–36.0)
MCV: 88.2 fL (ref 78.0–100.0)
Platelets: 305 10*3/uL (ref 150–400)
RBC: 3.3 MIL/uL — ABNORMAL LOW (ref 3.87–5.11)
RDW: 16.7 % — ABNORMAL HIGH (ref 11.5–15.5)
WBC: 12 10*3/uL — ABNORMAL HIGH (ref 4.0–10.5)

## 2015-07-07 MED ORDER — IBUPROFEN 600 MG PO TABS
600.0000 mg | ORAL_TABLET | Freq: Four times a day (QID) | ORAL | Status: DC | PRN
  Administered 2015-07-07 – 2015-07-08 (×2): 600 mg via ORAL
  Filled 2015-07-07 (×2): qty 1

## 2015-07-07 MED ORDER — OXYCODONE-ACETAMINOPHEN 5-325 MG PO TABS
1.0000 | ORAL_TABLET | ORAL | Status: DC | PRN
  Administered 2015-07-07 – 2015-07-08 (×3): 1 via ORAL
  Filled 2015-07-07 (×3): qty 1

## 2015-07-07 NOTE — Progress Notes (Signed)
Patient ID: [name], female   DOB: [birth year], 44 y.o.   MRN: [id number] #1 afebrile BP normal Abdomen soft, not tender. She has tolerated liquids but no flatus. HGB 11.3 to 9.5 Output excellent

## 2015-07-08 MED ORDER — IBUPROFEN 600 MG PO TABS: 600.0000 mg | ORAL_TABLET | Freq: Four times a day (QID) | ORAL | Status: DC | PRN

## 2015-07-08 MED ORDER — OXYCODONE-ACETAMINOPHEN 5-325 MG PO TABS: 1.0000 | ORAL_TABLET | Freq: Four times a day (QID) | ORAL | Status: DC | PRN

## 2015-07-08 NOTE — Progress Notes (Signed)
Patient ID: [name], female   DOB: 30-Mar-1971, 44 y.o.   MRN: [id number] #2 afebrile Had a bad day yesterday with non specific discomfort and headache. Feels fine today. Has tolerated oral feedings but no flatus. The abdomen is soft and not tender.

## 2015-07-08 NOTE — Progress Notes (Signed)
Patient discharged home with mom... Discharge instructions reviewed with patient and she verbalized understanding... Condition stable... No equipment... Taken to car via wheelchair by S. Dixon, NT.

## 2015-07-08 NOTE — Progress Notes (Signed)
Patient ID: [name], female   DOB: [birth year], 44 y.o.   MRN: [id number] #2 AFEBRILE sINCE ROUNDS THIS am SHE HAS BEGUN TO PASS FLATUS AND IS READY FOR D/C

## 2015-07-08 NOTE — Discharge Instructions (Signed)
rEPORT TEMP >100.4 DEGREES Avoid heavy lifting or strenuous activity. Call with heavy vaginal bleeding Notify MD is she has severe pain , nausea and vomiting or inability to tolerate a diet. No vaginal entrance.

## 2015-07-09 NOTE — Discharge Summary (Signed)
NAMEBREANE, MACMAHON             ACCOUNT NO.:  [id number]  MEDICAL RECORD NO.:  [id number]  LOCATION:  [id number]                          FACILITY:  [facility]  PHYSICIAN:  Lucille Passy. Ulanda Edison, M.D. DATE OF BIRTH:  04-16-1971  DATE OF ADMISSION:  07/06/2015 DATE OF DISCHARGE:  07/08/2015                              DISCHARGE SUMMARY   HISTORY OF PRESENT ILLNESS:  This is a 44 year old, black female, admitted for abdominal hysterectomy because of large uterine fibroids and severe menorrhagia and anemia.  Dr. Alphonsa Overall and I were co- surgeons.  The surgery involved lysis of multiple adhesions involving the adnexa to the uterus and the sigmoid colon into the uterus.  After the adhesions were released and abdominal hysterectomy was performed and part of the left ovary which continued to bleed was removed. Postoperatively, the patient did quite well and began passing flatus on the morning of the second postop day and was ready for discharge.  She ambulated well without difficulty, tolerated a diet, passed her urine well and now has begun passing flatus.  Her initial hemoglobin was 11.3, hematocrit 34.4, white count 13,000, platelet count 335,000.  Followup hemoglobin was 9.5, hematocrit 29.1, white count 12,000.  The pathology report showed that the uterus weighed 1060 g and measured 18 x 13 x 11 cm.  The uterine fibroids were benign and there were 25 intramural white, whorled, rubbery nodules measuring from 0.5-6.5 cm in diameter and all were benign fibroids.  The uterus showed secretory phase endometrium, benign cervical mucosa, benign uterine serosal adhesions.  The bleeding area on the left ovary was a corpus luteal cyst, negative for atypia and malignancy.  FINAL DIAGNOSES:  Leiomyomata uteri, menorrhagia, anemia, dense adhesions of the uterus to the tubes and ovaries bilaterally and to the sigmoid colon.  OPERATION:  Laparotomy, lysis of adhesions, abdominal hysterectomy, and removal of  a corpus luteum cyst from the left ovary  FINAL CONDITION:  Improved.  INSTRUCTIONS:  Include our regular discharge instructions.  No vaginal entrance.  No heavy lifting or strenuous activity.  Call with any fever greater than 100.4 degrees.  Call with any unusual problems.  Call with persistent nausea, vomiting, and inability to tolerate a diet, and any problems she is uncomfortable with.  She is to return to the office in 1 week to have her incision looked at. Could be that I will remove the staples on that visit or may not remove the staples.     Lucille Passy. Ulanda Edison, M.D.     TFH/MEDQ  D:  07/08/2015  T:  07/09/2015  Job:  [id number]  cc:   Fenton Malling. Lucia Gaskins, M.D. [id number] [address] [place] [address]

## 2015-07-11 ENCOUNTER — Inpatient Hospital Stay (HOSPITAL_COMMUNITY)
Admission: AD | Admit: 2015-07-11 | Discharge: 2015-07-11 | Disposition: A | Discharge status: Home / Self Care | Payer: BLUE CROSS/BLUE SHIELD | Source: Ambulatory Visit | Attending: Obstetrics and Gynecology | Admitting: Obstetrics and Gynecology

## 2015-07-11 ENCOUNTER — Encounter (HOSPITAL_COMMUNITY): Payer: Self-pay | Admitting: *Deleted

## 2015-07-11 DIAGNOSIS — Z880 Allergy status to penicillin: Secondary | ICD-10-CM | POA: Insufficient documentation

## 2015-07-11 DIAGNOSIS — N939 Abnormal uterine and vaginal bleeding, unspecified: Secondary | ICD-10-CM | POA: Insufficient documentation

## 2015-07-11 NOTE — MAU Note (Signed)
Pt reported spotting last night which turned into more of a trickle and still is.  Denies severe pain, but has not had a bowel movement.

## 2015-07-11 NOTE — Discharge Instructions (Signed)
Abdominal Hysterectomy, Care After °These instructions give you information on caring for yourself after your procedure. Your doctor may also give you more specific instructions. Call your doctor if you have any problems or questions after your procedure.  °HOME CARE °It takes 4-6 weeks to recover from this surgery. Follow all of your doctor's instructions.  °· Only take medicines as told by your doctor. °· Change your bandage as told by your doctor. °· Return to your doctor to have your stitches taken out. °· Take showers for 2-3 weeks. Ask your doctor when it is okay to shower. °· Do not douche, use tampons, or have sex (intercourse) for at least 6 weeks or as told. °· Follow your doctor's advice about exercise, lifting objects, driving, and general activities. °· Get plenty of rest and sleep. °· Do not lift anything heavier than a gallon of milk (about 10 pounds [4.5 kilograms]) for the first month after surgery. °· Get back to your normal diet as told by your doctor. °· Do not drink alcohol until your doctor says it is okay. °· Take a medicine to help you poop (laxative) as told by your doctor. °· Eating foods high in fiber may help you poop. Eat a lot of raw fruits and vegetables, whole grains, and beans. °· Drink enough fluids to keep your pee (urine) clear or pale yellow. °· Have someone help you at home for 1-2 weeks after your surgery. °· Keep follow-up doctor visits as told. °GET HELP IF: °· You have chills or fever. °· You have puffiness, redness, or pain in area of the cut (incision). °· You have yellowish-white fluid (pus) coming from the cut. °· You have a bad smell coming from the cut or bandage. °· Your cut pulls apart. °· You feel dizzy or light-headed. °· You have pain or bleeding when you pee. °· You keep having watery poop (diarrhea). °· You keep feeling sick to your stomach (nauseous) or keep throwing up (vomiting). °· You have fluid (discharge) coming from your vagina. °· You have a  rash. °· You have a reaction to your medicine. °· You need stronger pain medicine. °GET HELP RIGHT AWAY IF:  °· You have a fever and your symptoms suddenly get worse. °· You have bad belly (abdominal) pain. °· You have chest pain. °· You are short of breath. °· You pass out (faint). °· You have pain, puffiness, or redness of your leg. °· You bleed a lot from your vagina and notice clumps of tissue (clots). °MAKE SURE YOU:  °· Understand these instructions. °· Will watch your condition. °· Will get help right away if you are not doing well or get worse. °  °This information is not intended to replace advice given to you by your health care provider. Make sure you discuss any questions you have with your health care provider. °  °Document Released: 05/03/2008 Document Revised: 07/30/2013 Document Reviewed: 05/17/2013 °Elsevier Interactive Patient Education ©2016 [employer] ° °

## 2015-07-11 NOTE — MAU Provider Note (Signed)
History     CSN: [id number]  Arrival date and time: 07/11/15 1144   None     Chief Complaint  Patient presents with  . Vaginal Bleeding   HPI  STEPFANIE Brown 44 y.o. presents to the MAU with the complaint of having blood run down her leg earlier this morning when she got out of bed. She reports having vaginal bleeding starting yesterday that is like a lighter period. She has not reported another incident like that since this morning.   Past Medical History  Diagnosis Date  . Depression   . Anxiety   . Migraine   . Anemia     taking iron     Past Surgical History  Procedure Laterality Date  . Orif ankle fracture Right 10/03/2013    Procedure: OPEN REDUCTION INTERNAL FIXATION (ORIF) ANKLE FRACTURE;  Surgeon: Newt Minion, MD;  Location: [facility];  Service: Orthopedics;  Laterality: Right;  Open Reduction Internal Fixation Right Ankle  . Uterine fibroid surgery    . Hand surgery    . Ankle fracture surgery    . Fracture surgery    . Abdominal hysterectomy N/A 07/06/2015    Procedure: HYSTERECTOMY ABDOMINALwith removal of portion of left ovary;  Surgeon: Newton Pigg, MD;  Location: [facility];  Service: Gynecology;  Laterality: N/A;  Needs #1 Novafil Popoffs if Midline incision    History reviewed. No pertinent family history.  Social History  Substance Use Topics  . Smoking status: Never Smoker   . Smokeless tobacco: Never Used  . Alcohol Use: No    Allergies:  Allergies  Allergen Reactions  . Penicillins Other (See Comments)    Childhood rxn Has patient had a PCN reaction causing immediate rash, facial/tongue/throat swelling, SOB or lightheadedness with hypotension: No Has patient had a PCN reaction causing severe rash involving mucus membranes or skin necrosis: No Has patient had a PCN reaction that required hospitalization No Has patient had a PCN reaction occurring within the last 10 years: No If all of the above answers are "NO", then may proceed with  Cephalosporin use.     Prescriptions prior to admission  Medication Sig Dispense Refill Last Dose  . ibuprofen (ADVIL,MOTRIN) 600 MG tablet Take 1 tablet (600 mg total) by mouth every 6 (six) hours as needed for headache or mild pain. 30 tablet 0 07/11/2015 at Unknown time  . oxyCODONE-acetaminophen (PERCOCET/ROXICET) 5-325 MG tablet Take 1 tablet by mouth every 6 (six) hours as needed for moderate pain or severe pain. 30 tablet 0 07/11/2015 at Unknown time    Review of Systems  Genitourinary:       Vaginal bleeding  All other systems reviewed and are negative.  Physical Exam   Blood pressure 141/81, pulse 95, temperature 98.6 F (37 C), temperature source Oral, resp. rate 18, last menstrual period 06/16/2015.  Physical Exam  Nursing note and vitals reviewed. Constitutional: She is oriented to person, place, and time. She appears well-developed and well-nourished.  HENT:  Head: Normocephalic and atraumatic.  Cardiovascular: Normal rate and regular rhythm.   Respiratory: Effort normal and breath sounds normal. No respiratory distress.  GI: Soft. There is tenderness.  Microfoam tape over abdomen; Not soiled.  Genitourinary: Vagina normal.  No vaginal bleeding noted on inspection of perineum  Musculoskeletal: Normal range of motion.  Neurological: She is alert and oriented to person, place, and time.  Skin: Skin is warm and dry.  Psychiatric: She has a normal mood and affect. Her behavior is  normal. Judgment and thought content normal.    MAU Course  Procedures  MDM Pt came to MAU after being concerned that she had blood running down her leg earlier this morning.. Bleeding is only spotting now; Reassurred, spoke with Dr Melba Coon. Pt is to have appointment on Monday for follow up.  Assessment and Plan  Vaginal Bleeding  Discharge to home  [facility] 07/11/2015, 12:49 PM

## 2015-10-15 DIAGNOSIS — D500 Iron deficiency anemia secondary to blood loss (chronic): Secondary | ICD-10-CM | POA: Insufficient documentation

## 2015-10-15 DIAGNOSIS — F331 Major depressive disorder, recurrent, moderate: Secondary | ICD-10-CM | POA: Insufficient documentation

## 2015-10-15 HISTORY — DX: Iron deficiency anemia secondary to blood loss (chronic): D50.0

## 2015-12-04 LAB — HEPATIC FUNCTION PANEL
ALK PHOS: 70 (ref 25–125)
ALT: 20 (ref 7–35)
AST: 14 (ref 13–35)
BILIRUBIN, TOTAL: 0.3

## 2015-12-04 LAB — CBC AND DIFFERENTIAL
HCT: 36 (ref 36–46)
Hemoglobin: 11.2 — AB (ref 12.0–16.0)
PLATELETS: 376 (ref 150–399)
WBC: 3.9

## 2015-12-04 LAB — BASIC METABOLIC PANEL
BUN: 8 (ref 4–21)
CREATININE: 0.9 (ref 0.5–1.1)
GLUCOSE: 91
POTASSIUM: 4.3 (ref 3.4–5.3)
SODIUM: 135 — AB (ref 137–147)

## 2015-12-04 LAB — VITAMIN D 25 HYDROXY (VIT D DEFICIENCY, FRACTURES): Vit D, 25-Hydroxy: 20.92

## 2015-12-04 LAB — VITAMIN B12: Vitamin B-12: 554

## 2016-06-09 LAB — HM MAMMOGRAPHY

## 2016-09-26 ENCOUNTER — Emergency Department (HOSPITAL_BASED_OUTPATIENT_CLINIC_OR_DEPARTMENT_OTHER): Payer: BLUE CROSS/BLUE SHIELD

## 2016-09-26 ENCOUNTER — Emergency Department (HOSPITAL_BASED_OUTPATIENT_CLINIC_OR_DEPARTMENT_OTHER)
Admission: EM | Admit: 2016-09-26 | Discharge: 2016-09-27 | Disposition: A | Discharge status: Home / Self Care | Payer: BLUE CROSS/BLUE SHIELD | Attending: Emergency Medicine | Admitting: Emergency Medicine

## 2016-09-26 ENCOUNTER — Encounter (HOSPITAL_BASED_OUTPATIENT_CLINIC_OR_DEPARTMENT_OTHER): Payer: Self-pay | Admitting: Emergency Medicine

## 2016-09-26 DIAGNOSIS — Z791 Long term (current) use of non-steroidal anti-inflammatories (NSAID): Secondary | ICD-10-CM | POA: Diagnosis not present

## 2016-09-26 DIAGNOSIS — G43109 Migraine with aura, not intractable, without status migrainosus: Secondary | ICD-10-CM | POA: Insufficient documentation

## 2016-09-26 DIAGNOSIS — R51 Headache: Secondary | ICD-10-CM | POA: Diagnosis present

## 2016-09-26 IMAGING — CT CT HEAD W/O CM
3 series · 14 of 44 positions shown, 16 images · non-contrast
Comparison: MRI [DATE]

CLINICAL DATA: Migraine headache, dizziness with chest pain

EXAM:
CT HEAD WITHOUT CONTRAST
TECHNIQUE: Contiguous axial images were obtained from the base of the skull
through the vertex without intravenous contrast.

[Series 2: head wo · axial · 0.43mm/px · z∈[+290,+400]mm · 8 of 27 slices shown, 10 images]
[im 3/27  brain]
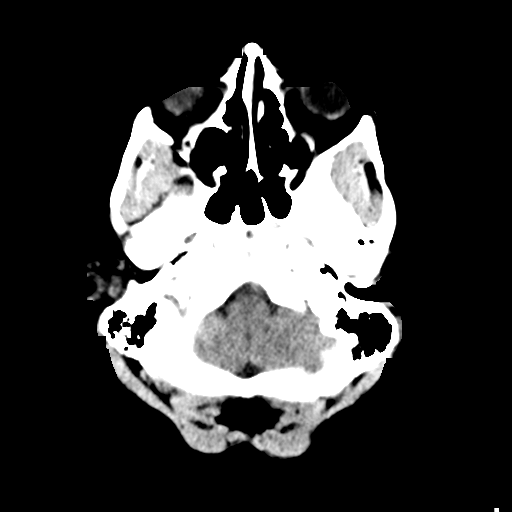
[im 3/27  bone]
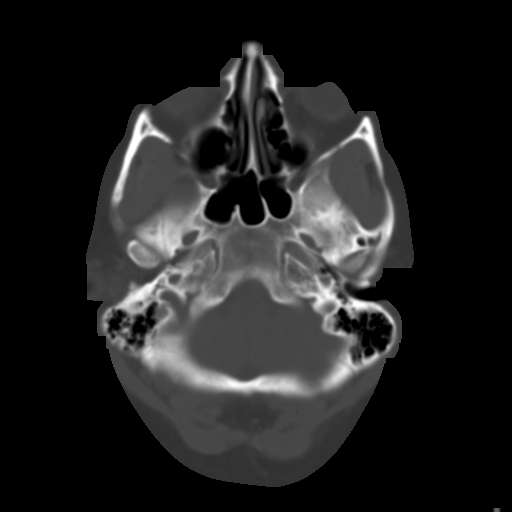
[im 6/27  brain]
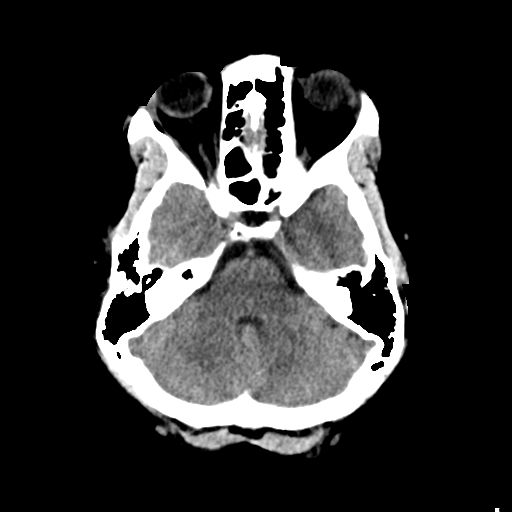
[im 9/27  brain]
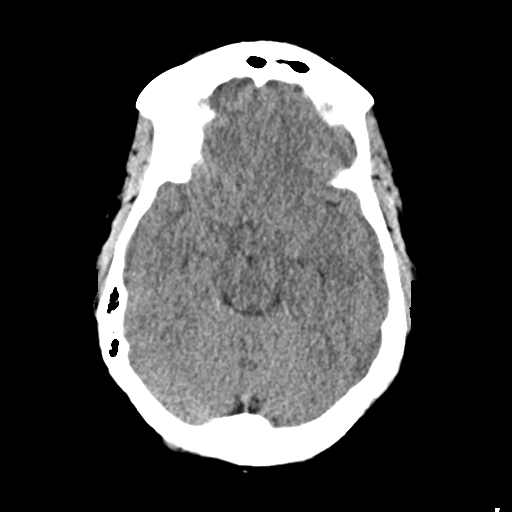
[im 12/27  brain]
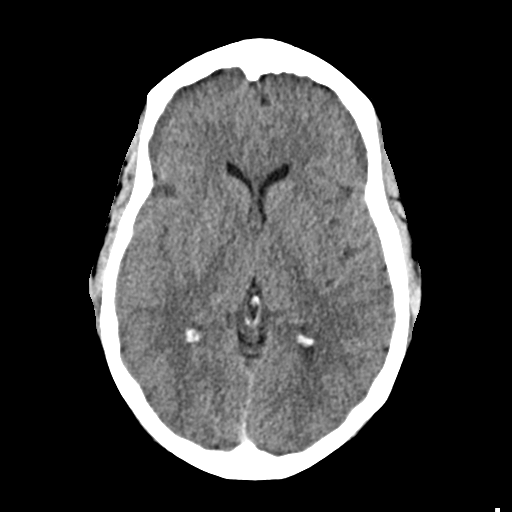
[im 16/27  brain]
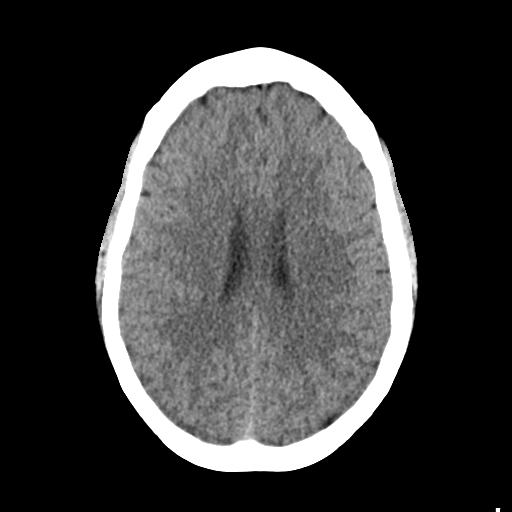
[im 16/27  bone]
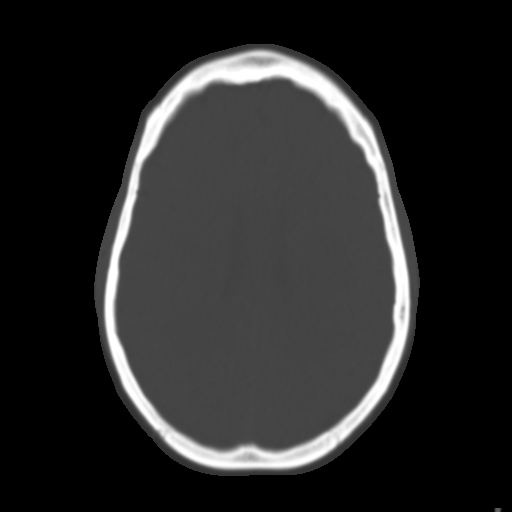
[im 19/27  brain]
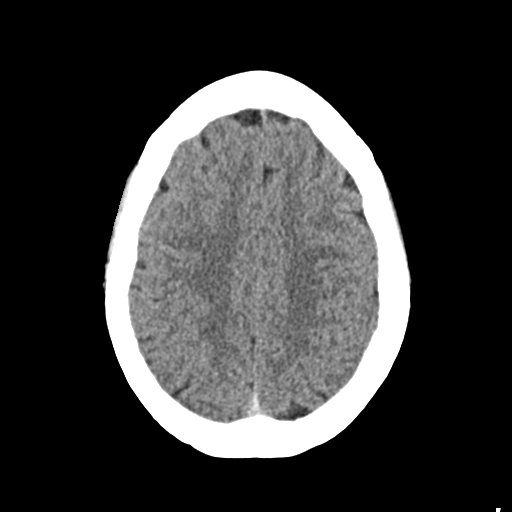
[im 22/27  brain]
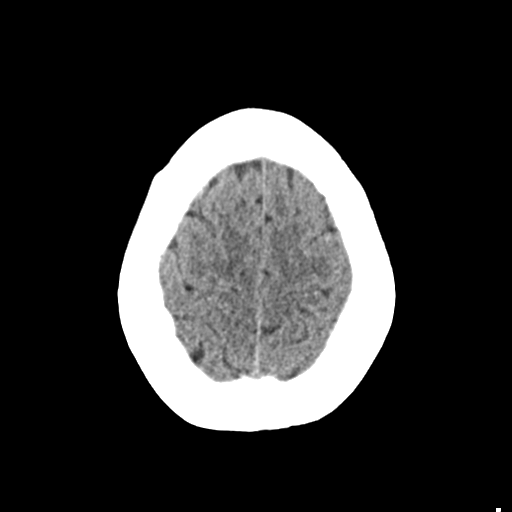
[im 25/27  brain]
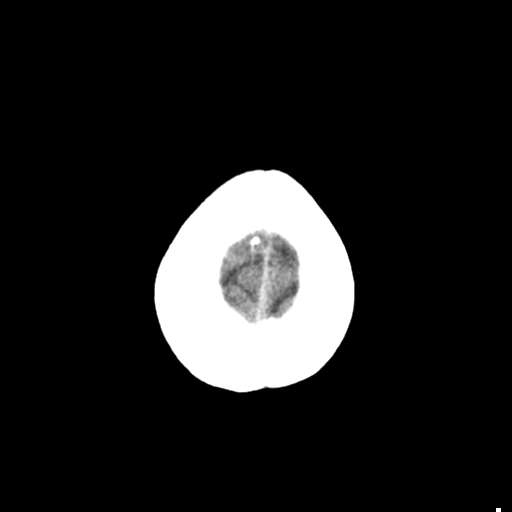

[Series 4: coronal soft · coronal · 0.27mm/px · 3 of 67 slices shown]
[im 23/67  brain]
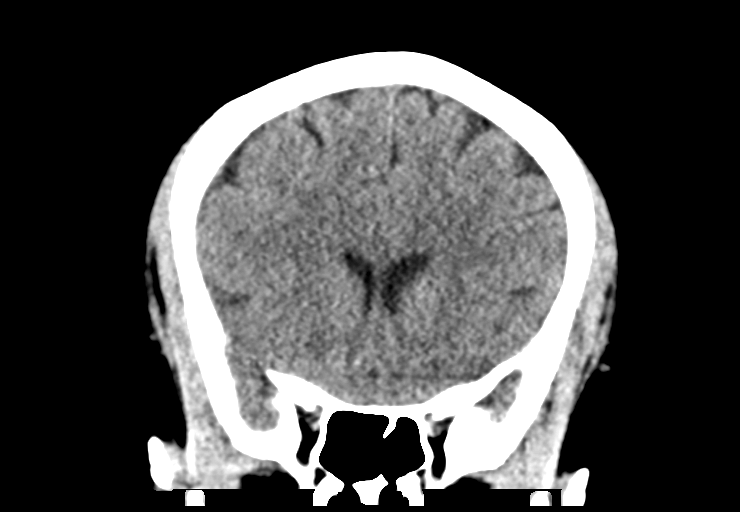
[im 30/67  brain]
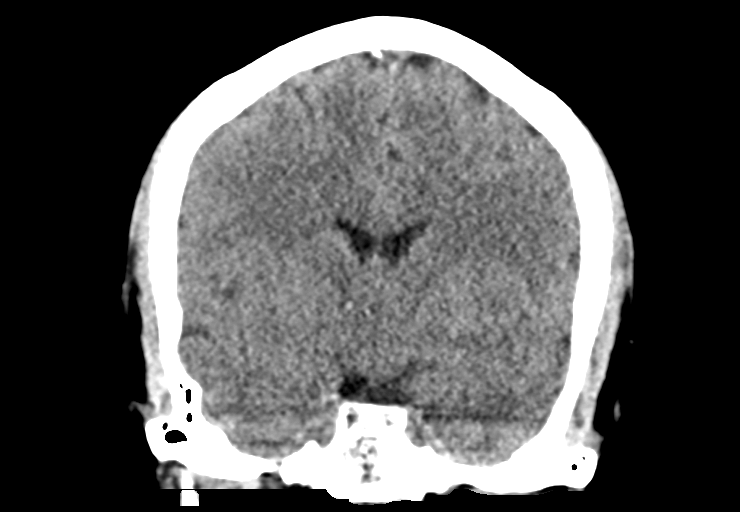
[im 37/67  brain]
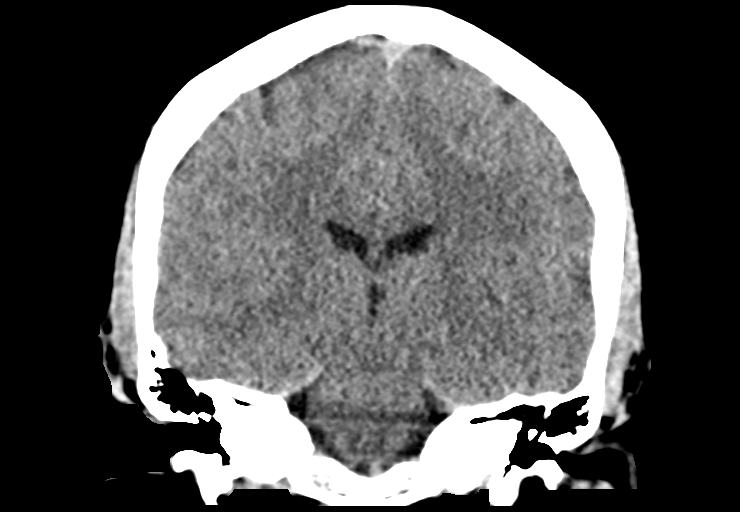

[Series 5: sag soft · sagittal · 0.26mm/px · 3 of 67 slices shown]
[im 23/67  brain]
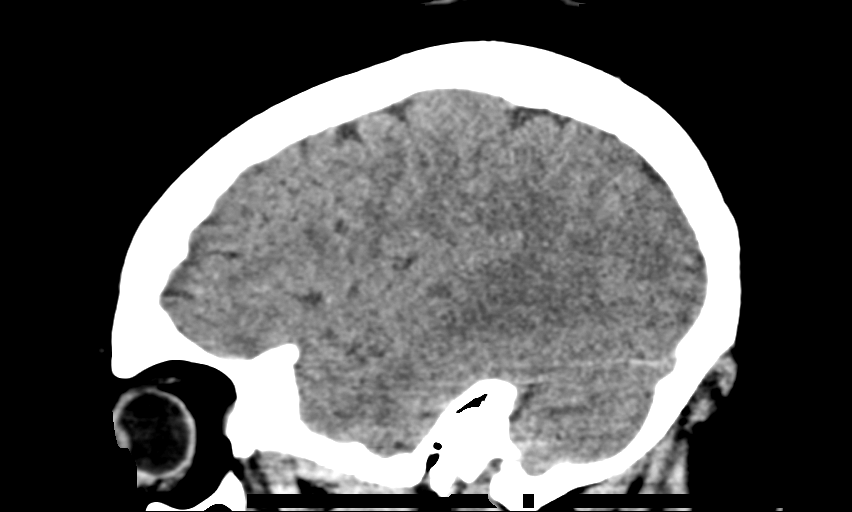
[im 34/67  brain]
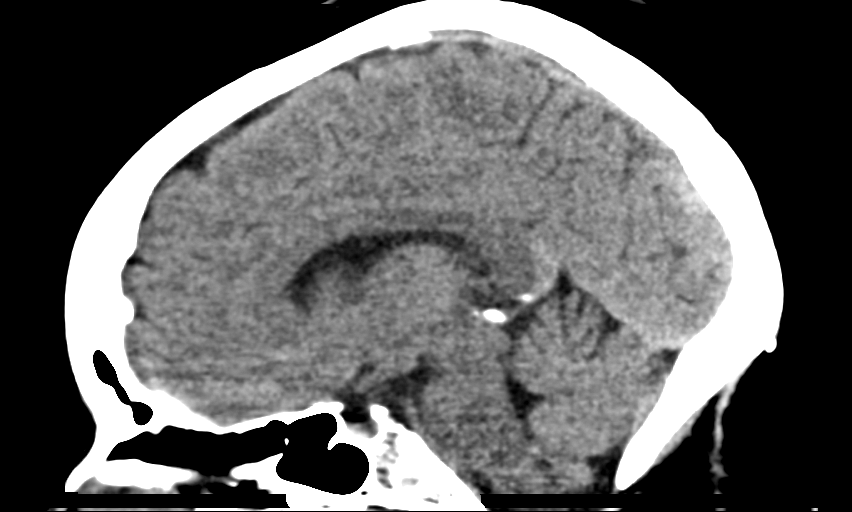
[im 45/67  brain]
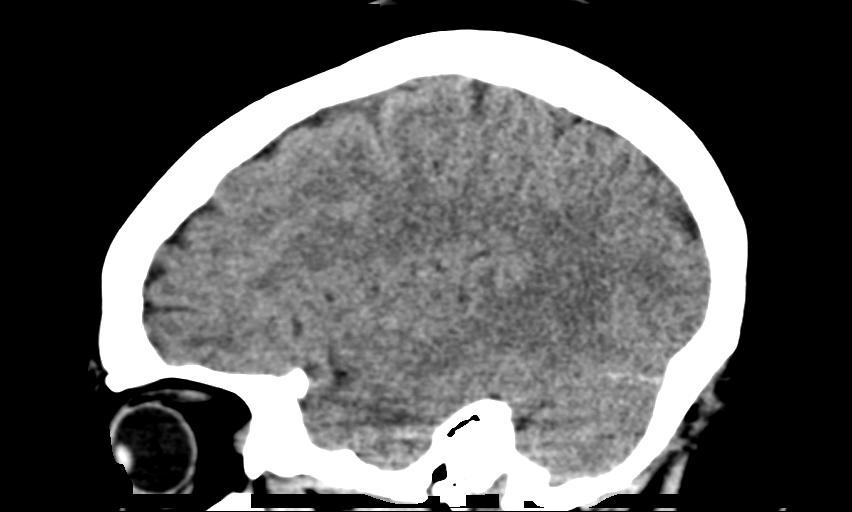

[14 of 44 positions shown; findings below may reference images not displayed]

FINDINGS: Brain: No evidence of acute infarction, hemorrhage, hydrocephalus,
extra-axial collection or mass lesion/mass effect.

Vascular: No hyperdense vessel or unexpected calcification.

Skull: Normal. Negative for fracture or focal lesion.

Sinuses/Orbits: No acute finding. Minimal mucosal thickening in the
ethmoid sinuses.

Other: None
IMPRESSION: No CT evidence for acute intracranial abnormality.

## 2016-09-26 NOTE — ED Triage Notes (Signed)
Patient states that at 7 pm she started to have new onset Blurred vision and numbness and tingling to her right arm and leg. The patient reports that this was after she had a sharp shooting pain to her right side of her face and head. The patient states that all of these symptoms have now cleared and she only has some tingling to her bilateral feet and a headache on the left side of her head around to her neck that is typical for her Migraines. The patient is alert and oriented. Neuro status is WNL

## 2016-09-26 NOTE — ED Notes (Signed)
Back from CT

## 2016-09-26 NOTE — ED Notes (Signed)
EDP into room, prior to RN assessment, see MD notes, pending orders.   

## 2016-09-26 NOTE — ED Provider Notes (Signed)
[facility] DEPT [facility] Provider Note   CSN: [id number] Arrival date & time: 09/26/16  2043  By signing my name below, I, Reola Mosher, attest that this documentation has been prepared under the direction and in the presence of Kayden Amend, MD. Electronically Signed: Reola Mosher, ED Scribe. 09/26/16. 11:23 PM.  History   Chief Complaint Chief Complaint  Patient presents with  . Headache   HPI [name] is a 46 y.o. female with a h/o migraines, who presents to the Emergency Department complaining of left-sided, frontal headache with radiation into her left-sided neck beginning approximately five hours ago. Pt reports that she was driving today when she had a sharp pain onto her right-sided neck across her face. This resolved on it's own, however, her headache has been present since. Pt has a h/o migraines and notes that her headache today feels similar to this, with last one year ago. This issue has been managed by her PCP, and she is not followed by a neurologist. Pt has a FHx of cerebral aneurysms; however, her last MRI Head was negative for this. She is not currently followed by a Neurologist for this issue. Her headache is exacerbated with wrinkling of the brow. No noted treatments for her pain were tried prior to coming into the ED. Pt notes that she has been under considerable amounts of stress recently prior to the onset of her pain. Pt denies cough, congestion, sore throat, or any other associated symptoms.   The history is provided by the patient. No language interpreter was used.  Headache   This is a new problem. The current episode started 3 to 5 hours ago. The problem occurs constantly. The problem has been rapidly improving. The headache is associated with nothing. The pain is located in the left unilateral region. The quality of the pain is described as sharp. The pain is at a severity of 7/10. The pain is moderate. The pain radiates to the face and left  neck. Pertinent negatives include no anorexia, no fever, no malaise/fatigue, no near-syncope, no orthopnea, no palpitations, no syncope, no shortness of breath, no nausea and no vomiting. She has tried nothing for the symptoms. The treatment provided no relief.   Past Medical History:  Diagnosis Date  . Anemia    taking iron   . Anxiety   . Depression   . Migraine    Patient Active Problem List   Diagnosis Date Noted  . Fibroids 07/06/2015  . Ankle fracture, lateral malleolus, closed 10/03/2013   Past Surgical History:  Procedure Laterality Date  . ABDOMINAL HYSTERECTOMY N/A 07/06/2015   Procedure: HYSTERECTOMY ABDOMINALwith removal of portion of left ovary;  Surgeon: Newton Pigg, MD;  Location: [facility];  Service: Gynecology;  Laterality: N/A;  Needs #1 Novafil Popoffs if Midline incision  . ANKLE FRACTURE SURGERY    . FRACTURE SURGERY    . HAND SURGERY    . ORIF ANKLE FRACTURE Right 10/03/2013   Procedure: OPEN REDUCTION INTERNAL FIXATION (ORIF) ANKLE FRACTURE;  Surgeon: Newt Minion, MD;  Location: [facility];  Service: Orthopedics;  Laterality: Right;  Open Reduction Internal Fixation Right Ankle  . UTERINE FIBROID SURGERY     OB History    Gravida Para Term Preterm AB Living   0 0 0 0 0     SAB TAB Ectopic Multiple Live Births   0 0 0           Home Medications    Prior to Admission  medications   Medication Sig Start Date End Date Taking? Authorizing Provider  ibuprofen (ADVIL,MOTRIN) 600 MG tablet Take 1 tablet (600 mg total) by mouth every 6 (six) hours as needed for headache or mild pain. 07/08/15   Newton Pigg, MD  oxyCODONE-acetaminophen (PERCOCET/ROXICET) 5-325 MG tablet Take 1 tablet by mouth every 6 (six) hours as needed for moderate pain or severe pain. 07/08/15   Newton Pigg, MD    Family History History reviewed. No pertinent family history.  Social History Social History  Substance Use Topics  . Smoking status: Never Smoker  . Smokeless tobacco: Never  Used  . Alcohol use No     Allergies   Penicillins   Review of Systems Review of Systems  Constitutional: Negative for chills, diaphoresis, fever and malaise/fatigue.  HENT: Negative for congestion and facial swelling.   Eyes: Negative for photophobia and visual disturbance.  Respiratory: Negative for cough and shortness of breath.   Cardiovascular: Negative for chest pain, palpitations, orthopnea, leg swelling, syncope and near-syncope.  Gastrointestinal: Negative for anorexia, nausea and vomiting.  Neurological: Positive for headaches. Negative for dizziness, tremors, seizures, syncope, facial asymmetry, speech difficulty, weakness, light-headedness and numbness.  All other systems reviewed and are negative.  Physical Exam Updated Vital Signs BP 115/77 (BP Location: Left Arm)   Pulse 77   Temp 97.8 F (36.6 C) (Oral)   Resp 18   Ht 5\' 4"  (1.626 m)   Wt 263 lb (119.3 kg)   SpO2 97%   BMI 45.14 kg/m   Physical Exam  Constitutional: She is oriented to person, place, and time. She appears well-developed and well-nourished. No distress.  Sitting comfortably in the room texting and watching TV  HENT:  Head: Normocephalic and atraumatic.  Right Ear: Tympanic membrane, external ear and ear canal normal.  Left Ear: Tympanic membrane, external ear and ear canal normal.  Nose: Nose normal.  Mouth/Throat: Oropharynx is clear and moist. No oropharyngeal exudate.  Eyes: Conjunctivae and EOM are normal. Pupils are equal, round, and reactive to light. Right eye exhibits no discharge. Left eye exhibits no discharge. No scleral icterus.  No proptosis, no papilledema.  Can move eyes in all directions  Neck: Normal range of motion. Neck supple. No JVD present. No tracheal deviation present.  Trachea is midline. No stridor or carotid bruits.   Cardiovascular: Normal rate, regular rhythm, normal heart sounds and intact distal pulses.   No murmur heard. Pulmonary/Chest: Effort normal and  breath sounds normal. No stridor. No respiratory distress. She has no wheezes. She has no rales.  Lungs CTA bilaterally.  Abdominal: Soft. Bowel sounds are normal. She exhibits no distension and no mass. There is no tenderness. There is no rebound and no guarding.  Musculoskeletal: Normal range of motion. She exhibits no edema, tenderness or deformity.  All compartments are soft. No palpable cords.   Lymphadenopathy:    She has no cervical adenopathy.  Neurological: She is alert and oriented to person, place, and time. She has normal reflexes. She displays normal reflexes. No cranial nerve deficit. She exhibits normal muscle tone. Coordination normal.  5/5 strength to the bilateral upper and lower extremities.  Intact cognition  Skin: Skin is warm and dry. Capillary refill takes less than 2 seconds.  Psychiatric: She has a normal mood and affect. Her behavior is normal.  Nursing note and vitals reviewed.  ED Treatments / Results   Vitals:   09/27/16 0030 09/27/16 0100  BP: 98/59 94/63  Pulse: 70 66  Resp: 17 15  Temp:      DIAGNOSTIC STUDIES: Oxygen Saturation is 98% on RA, normal by my interpretation.   COORDINATION OF CARE: 11:21 PM-Discussed next steps with pt. Pt verbalized understanding and is agreeable with the plan.   Results for orders placed or performed during the hospital encounter of 07/06/15  Pregnancy, urine  Result Value Ref Range   Preg Test, Ur NEGATIVE NEGATIVE  CBC  Result Value Ref Range   WBC 13.0 (H) 4.0 - 10.5 K/uL   RBC 3.90 3.87 - 5.11 MIL/uL   Hemoglobin 11.3 (L) 12.0 - 15.0 g/dL   HCT 34.4 (L) 36.0 - 46.0 %   MCV 88.2 78.0 - 100.0 fL   MCH 29.0 26.0 - 34.0 pg   MCHC 32.8 30.0 - 36.0 g/dL   RDW 16.4 (H) 11.5 - 15.5 %   Platelets 335 150 - 400 K/uL  Creatinine, serum  Result Value Ref Range   Creatinine, Ser 0.90 0.44 - 1.00 mg/dL   GFR calc non Af Amer >60 >60 mL/min   GFR calc Af Amer >60 >60 mL/min  CBC  Result Value Ref Range   WBC  12.0 (H) 4.0 - 10.5 K/uL   RBC 3.30 (L) 3.87 - 5.11 MIL/uL   Hemoglobin 9.5 (L) 12.0 - 15.0 g/dL   HCT 29.1 (L) 36.0 - 46.0 %   MCV 88.2 78.0 - 100.0 fL   MCH 28.8 26.0 - 34.0 pg   MCHC 32.6 30.0 - 36.0 g/dL   RDW 16.7 (H) 11.5 - 15.5 %   Platelets 305 150 - 400 K/uL  Type and screen Type and Screen on C/S only  Result Value Ref Range   ABO/RH(D) AB POS    Antibody Screen NEG    Sample Expiration 07/09/2015   ABO/Rh  Result Value Ref Range   ABO/RH(D) AB POS    Ct Head Wo Contrast  Result Date: 09/26/2016 CLINICAL DATA:  Migraine headache, dizziness with chest pain EXAM: CT HEAD WITHOUT CONTRAST TECHNIQUE: Contiguous axial images were obtained from the base of the skull through the vertex without intravenous contrast. COMPARISON:  MRI 07/24/2014 FINDINGS: Brain: No evidence of acute infarction, hemorrhage, hydrocephalus, extra-axial collection or mass lesion/mass effect. Vascular: No hyperdense vessel or unexpected calcification. Skull: Normal. Negative for fracture or focal lesion. Sinuses/Orbits: No acute finding. Minimal mucosal thickening in the ethmoid sinuses. Other: None IMPRESSION: No CT evidence for acute intracranial abnormality. Electronically Signed   By: Donavan Foil M.D.   On: 09/26/2016 23:50     EKG Interpretation  Date/Time:  Monday September 26 2016 20:52:46 EST Ventricular Rate:  83 PR Interval:  150 QRS Duration: 80 QT Interval:  372 QTC Calculation: 437 R Axis:   73 Text Interpretation:  Normal sinus rhythm Confirmed by [facility]  MD, Ninel Abdella ([id number]) on 09/26/2016 11:15:41 PM      Procedures Procedures   Medications Ordered in ED Medications  ketorolac (TORADOL) injection 60 mg (not administered)  metoCLOPramide (REGLAN) injection 10 mg (not administered)  diphenhydrAMINE (BENADRYL) injection 25 mg (not administered)    Refused these medications.    CT within 6 hours of onset, just over 3 hours excludes bleed.  No meningismus.  No changes in  cognition, is comfortable in the room without any medication with all the lights on.  I highly doubt acute intracranial pathology and do not believe patient requires an LP at this time.  She is comfortable enough to refuse medication for her headache at this time.  12:32 AM- Pt CT clear of intracranial pathology. No need for LP as this is negative and presentation of symptoms is unlikely for meningitis. No meningismus on exam. Migraine cocktail given, will reassess following.   Final Clinical Impressions(s) / ED Diagnoses  Migraine: All questions answered to patient's satisfaction. Based on history and exam patient has been appropriately medically screened and emergency conditions excluded. Patient is stable for discharge at this time. Strict return precautions given for any worsening symptoms, shortness of breath, altered sensorium intractable pain, difficulty speaking or swallowing or anyfurther problems or concerns New Prescriptions New Prescriptions New Prescriptions   No medications on file   I personally performed the services described in this documentation, which was scribed in my presence. The recorded information has been reviewed and is accurate.       Veatrice Kells, MD 09/27/16 [id number]

## 2016-09-27 MED ORDER — METOCLOPRAMIDE HCL 5 MG/ML IJ SOLN
10.0000 mg | Freq: Once | INTRAMUSCULAR | Status: DC
  Filled 2016-09-27: qty 2

## 2016-09-27 MED ORDER — NAPROXEN 250 MG PO TABS
500.0000 mg | ORAL_TABLET | Freq: Once | ORAL | Status: AC
  Administered 2016-09-27: 500 mg via ORAL
  Filled 2016-09-27: qty 2

## 2016-09-27 MED ORDER — NAPROXEN 375 MG PO TABS: 375.0000 mg | ORAL_TABLET | Freq: Two times a day (BID) | ORAL | 0 refills | Status: DC

## 2016-09-27 MED ORDER — METOCLOPRAMIDE HCL 10 MG PO TABS: 10.0000 mg | ORAL_TABLET | Freq: Four times a day (QID) | ORAL | 0 refills | Status: DC | PRN

## 2016-09-27 MED ORDER — DIPHENHYDRAMINE HCL 50 MG/ML IJ SOLN
25.0000 mg | Freq: Once | INTRAMUSCULAR | Status: DC
  Filled 2016-09-27: qty 1

## 2016-09-27 MED ORDER — KETOROLAC TROMETHAMINE 60 MG/2ML IM SOLN
60.0000 mg | Freq: Once | INTRAMUSCULAR | Status: DC
  Filled 2016-09-27: qty 2

## 2016-09-27 NOTE — ED Notes (Signed)
Here for earlier sx of HA, body cramps, nausea, sob, dizziness "mostly resolved".  Alert, NAD, calm, interactive, resps e/u, speaking in clear complete sentences, no dyspnea noted, skin W&D, VSS, mentions HA improved, now 5/10, and general body cramps (resolved), denies other sx, (denies: pain, sob, nausea, dizziness, cold sx, cough, congestion, facial pain, earache, sore throat, fever, or visual changes). Family at [facility].

## 2016-09-27 NOTE — ED Notes (Signed)
Updated on wait and delay, pt/family understanding.

## 2017-02-28 ENCOUNTER — Telehealth: Payer: Self-pay | Admitting: Family Medicine

## 2017-02-28 NOTE — Telephone Encounter (Signed)
I called and left a voicemail for patient to call and get scheduled as a new patient with Dr. Juleen China based on the submission from online. Awaiting patient's phone call.

## 2017-03-14 ENCOUNTER — Other Ambulatory Visit: Payer: Self-pay | Admitting: Obstetrics and Gynecology

## 2017-03-14 DIAGNOSIS — N644 Mastodynia: Secondary | ICD-10-CM

## 2017-03-16 ENCOUNTER — Ambulatory Visit: Payer: BLUE CROSS/BLUE SHIELD

## 2017-03-16 ENCOUNTER — Ambulatory Visit
Admission: RE | Admit: 2017-03-16 | Discharge: 2017-03-16 | Disposition: A | Discharge status: Home / Self Care | Payer: BLUE CROSS/BLUE SHIELD | Source: Ambulatory Visit | Attending: Obstetrics and Gynecology | Admitting: Obstetrics and Gynecology

## 2017-03-16 DIAGNOSIS — N644 Mastodynia: Secondary | ICD-10-CM

## 2017-03-20 ENCOUNTER — Ambulatory Visit (INDEPENDENT_AMBULATORY_CARE_PROVIDER_SITE_OTHER): Payer: BLUE CROSS/BLUE SHIELD | Admitting: Family Medicine

## 2017-03-20 ENCOUNTER — Encounter: Payer: Self-pay | Admitting: Family Medicine

## 2017-03-20 VITALS — BP 118/78 | HR 84 | Temp 98.3°F | Ht 64.0 in | Wt 260.6 lb

## 2017-03-20 DIAGNOSIS — G43109 Migraine with aura, not intractable, without status migrainosus: Secondary | ICD-10-CM | POA: Insufficient documentation

## 2017-03-20 DIAGNOSIS — Z6839 Body mass index (BMI) 39.0-39.9, adult: Secondary | ICD-10-CM | POA: Insufficient documentation

## 2017-03-20 DIAGNOSIS — R5383 Other fatigue: Secondary | ICD-10-CM

## 2017-03-20 DIAGNOSIS — R319 Hematuria, unspecified: Secondary | ICD-10-CM | POA: Diagnosis not present

## 2017-03-20 DIAGNOSIS — R0789 Other chest pain: Secondary | ICD-10-CM

## 2017-03-20 DIAGNOSIS — R7989 Other specified abnormal findings of blood chemistry: Secondary | ICD-10-CM | POA: Insufficient documentation

## 2017-03-20 DIAGNOSIS — E663 Overweight: Secondary | ICD-10-CM | POA: Insufficient documentation

## 2017-03-20 DIAGNOSIS — E559 Vitamin D deficiency, unspecified: Secondary | ICD-10-CM | POA: Diagnosis not present

## 2017-03-20 DIAGNOSIS — Z13220 Encounter for screening for lipoid disorders: Secondary | ICD-10-CM | POA: Diagnosis not present

## 2017-03-20 DIAGNOSIS — E6609 Other obesity due to excess calories: Secondary | ICD-10-CM | POA: Insufficient documentation

## 2017-03-20 HISTORY — DX: Morbid (severe) obesity due to excess calories: E66.01

## 2017-03-20 LAB — COMPREHENSIVE METABOLIC PANEL
ALT: 10 U/L (ref 0–35)
AST: 13 U/L (ref 0–37)
Albumin: 3.7 g/dL (ref 3.5–5.2)
Alkaline Phosphatase: 62 U/L (ref 39–117)
BUN: 13 mg/dL (ref 6–23)
CO2: 30 mEq/L (ref 19–32)
Calcium: 8.7 mg/dL (ref 8.4–10.5)
Chloride: 101 mEq/L (ref 96–112)
Creatinine, Ser: 0.78 mg/dL (ref 0.40–1.20)
GFR: 101.86 mL/min (ref >60.00)
Glucose, Bld: 88 mg/dL (ref 70–99)
Potassium: 4.2 mEq/L (ref 3.5–5.1)
Sodium: 136 mEq/L (ref 135–145)
Total Bilirubin: 0.3 mg/dL (ref 0.2–1.2)
Total Protein: 6.7 g/dL (ref 6.0–8.3)

## 2017-03-20 LAB — URINALYSIS, ROUTINE W REFLEX MICROSCOPIC
Bilirubin Urine: NEGATIVE
Hgb urine dipstick: NEGATIVE
Ketones, ur: NEGATIVE
Leukocytes, UA: NEGATIVE
Nitrite: NEGATIVE
Specific Gravity, Urine: 1.02 (ref 1.000–1.030)
Total Protein, Urine: NEGATIVE
Urine Glucose: NEGATIVE
Urobilinogen, UA: 0.2 (ref 0.0–1.0)
pH: 6 (ref 5.0–8.0)

## 2017-03-20 LAB — CBC WITH DIFFERENTIAL/PLATELET
Basophils Absolute: 0 10*3/uL (ref 0.0–0.1)
Basophils Relative: 0.4 % (ref 0.0–3.0)
Eosinophils Absolute: 0.1 10*3/uL (ref 0.0–0.7)
Eosinophils Relative: 2.7 % (ref 0.0–5.0)
HCT: 36.9 % (ref 36.0–46.0)
Hemoglobin: 12 g/dL (ref 12.0–15.0)
Lymphocytes Relative: 31.3 % (ref 12.0–46.0)
Lymphs Abs: 1.7 10*3/uL (ref 0.7–4.0)
MCHC: 32.6 g/dL (ref 30.0–36.0)
MCV: 90.6 fl (ref 78.0–100.0)
Monocytes Absolute: 0.5 10*3/uL (ref 0.1–1.0)
Monocytes Relative: 9.2 % (ref 3.0–12.0)
Neutro Abs: 3 10*3/uL (ref 1.4–7.7)
Neutrophils Relative %: 56.4 % (ref 43.0–77.0)
Platelets: 322 10*3/uL (ref 150.0–400.0)
RBC: 4.08 Mil/uL (ref 3.87–5.11)
RDW: 14.8 % (ref 11.5–15.5)
WBC: 5.4 10*3/uL (ref 4.0–10.5)

## 2017-03-20 LAB — LIPID PANEL
Cholesterol: 263 mg/dL — ABNORMAL HIGH (ref 0–200)
HDL: 41.2 mg/dL (ref >39.00)
LDL Cholesterol: 203 mg/dL — ABNORMAL HIGH (ref 0–99)
NonHDL: 221.89
Total CHOL/HDL Ratio: 6
Triglycerides: 95 mg/dL (ref 0.0–149.0)
VLDL: 19 mg/dL (ref 0.0–40.0)

## 2017-03-20 LAB — HEMOGLOBIN A1C: Hgb A1c MFr Bld: 6.3 % (ref 4.6–6.5)

## 2017-03-20 LAB — VITAMIN D 25 HYDROXY (VIT D DEFICIENCY, FRACTURES): VITD: 14.19 ng/mL — ABNORMAL LOW (ref 30.00–100.00)

## 2017-03-20 LAB — TSH: TSH: 3.8 u[IU]/mL (ref 0.35–4.50)

## 2017-03-20 LAB — MAGNESIUM: Magnesium: 1.7 mg/dL (ref 1.5–2.5)

## 2017-03-20 MED ORDER — PHENTERMINE-TOPIRAMATE ER 7.5-46 MG PO CP24: 1.0000 | ORAL_CAPSULE | Freq: Every day | ORAL | 1 refills | Status: DC

## 2017-03-20 MED ORDER — PHENTERMINE-TOPIRAMATE ER 3.75-23 MG PO CP24: 1.0000 | ORAL_CAPSULE | Freq: Every day | ORAL | 0 refills | Status: DC

## 2017-03-20 MED ORDER — LORAZEPAM 0.5 MG PO TABS: 0.5000 mg | ORAL_TABLET | Freq: Every day | ORAL | 0 refills | Status: DC

## 2017-03-20 NOTE — Progress Notes (Signed)
[name] is a 46 y.o. female is here to [name].   Patient Care Team: Eileen Deutscher, DO as PCP - General (Family Medicine) Eileen Deutscher, DO (Family Medicine)   History of Present Illness:   [name] CMA acting as scribe for Dr. Juleen Brown.  HPI: Patient comes in today to establish care. Patient is requesting physical, but has multiple concerns today. 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    Chest pain: Patient has been having some chest pain that has been off and on for several months. She was seen in the ED at [facility] for this on Friday night. She stated that she had an EKG that was normal at that time. She was having some left breast pain and had a mammogram that was normal. She started having some left side numbness with nausea. Her arm was having weakness and left hand numbness.   Migraine: Patient stated that she has a history of migraines. She has migraines with aura. Was seen in February for the migraine.    Focus problems: Patient stated that she has problems focusing on her task. She stated that she will be talking and 100 different things going through her mind.    Health Maintenance Due  Topic Date Due  . INFLUENZA VACCINE  03/08/2017   No flowsheet data found. PMHx, SurgHx, SocialHx, Medications, and Allergies were reviewed in the Visit Navigator and updated as appropriate.   Past Medical History:  Diagnosis Date  . Anxiety   . Depression   . Iron deficiency anemia due to chronic blood loss 10/15/2015  . Migraine   . Morbid obesity ([place]) 03/20/2017  . Uterine leiomyoma 07/06/2015   Past Surgical History:  Procedure Laterality Date  . ABDOMINAL HYSTERECTOMY N/A 07/06/2015   Procedure: HYSTERECTOMY ABDOMINALwith removal of portion of left ovary;  Surgeon: Eileen Pigg, MD;  Location: [facility];  Service: Gynecology;  Laterality: N/A;  Needs #1 Novafil Popoffs if Midline incision  . ANKLE FRACTURE SURGERY    .  FRACTURE SURGERY    . HAND SURGERY    . ORIF ANKLE FRACTURE Right 10/03/2013   Procedure: OPEN REDUCTION INTERNAL FIXATION (ORIF) ANKLE FRACTURE;  Surgeon: Newt Minion, MD;  Location: [facility];  Service: Orthopedics;  Laterality: Right;  Open Reduction Internal Fixation Right Ankle  . UTERINE FIBROID SURGERY     Family History  Problem Relation Age of Onset  . Breast cancer Maternal Aunt 30  . Breast cancer Cousin 4   Social History  Substance Use Topics  . Smoking status: Never Smoker  . Smokeless tobacco: Never Used  . Alcohol use No   Current Medications and Allergies:   .  naproxen (NAPROSYN) 500 MG tablet, TK 1 T PO BID, Disp: , Rfl: 0   Allergies  Allergen Reactions  . Penicillins Other (See Comments)    Childhood rxn Has patient had a PCN reaction causing immediate rash, facial/tongue/throat swelling, SOB or lightheadedness with hypotension: No Has patient had a PCN reaction causing severe rash involving mucus membranes or skin necrosis: No Has patient had a PCN reaction that required hospitalization No Has patient had a PCN reaction occurring within the last 10 years: No If all of the above answers are "NO", then may proceed with Cephalosporin use.    Review of Systems:   Pertinent items are noted in the HPI. Otherwise, ROS is negative.  Vitals:   Vitals:   03/20/17  0736  BP: 118/78  Pulse: 84  Temp: 98.3 F (36.8 C)  TempSrc: Oral  SpO2: 99%  Weight: 260 lb 9.6 oz (118.2 kg)  Height: 5\' 4"  (1.626 m)     Body mass index is 44.73 kg/m. Physical Exam:   Physical Exam  Constitutional: She is oriented to person, place, and time. She appears well-developed and well-nourished. No distress.  HENT:  Head: Normocephalic and atraumatic.  Right Ear: External ear normal.  Left Ear: External ear normal.  Nose: Nose normal.  Mouth/Throat: Oropharynx is clear and moist.  Eyes: Pupils are equal, round, and reactive to light. Conjunctivae and EOM are normal.    Neck: Normal range of motion. Neck supple.  Cardiovascular: Normal rate, regular rhythm, normal heart sounds and intact distal pulses.   No murmur heard. Pulmonary/Chest: Effort normal and breath sounds normal.  Abdominal: Soft. Bowel sounds are normal.  Musculoskeletal: Normal range of motion.  Neurological: She is alert and oriented to person, place, and time.  Skin: Skin is warm. Capillary refill takes less than 2 seconds.  Psychiatric: She has a normal mood and affect. Her behavior is normal.  Nursing note and vitals reviewed.  EKG: normal sinus rhythm.   Assessment and Plan:   [name] was seen today for establish care.  Diagnoses and all orders for this visit:  Migraine with aura and without status migrainosus, not intractable Comments: Worsened. Previous negative work-ups. No prior preventive medications. Topiramate in Qsymia should be helpful.  Hematuria, unspecified type Comments: UA with micro today. Orders: -     Urinalysis, Routine w reflex microscopic  Atypical chest pain Comments: Not consistent with cardiac etiology. EKG NSR. Patient able to excercise without issues.  Orders: -     CBC with Differential/Platelet -     Comprehensive metabolic panel -     EKG 37-CHYI  Fatigue, unspecified type -     CBC with Differential/Platelet -     Comprehensive metabolic panel -     Hemoglobin A1c -     TSH -     Lipid panel -     Magnesium -     LORazepam (ATIVAN) 0.5 MG tablet; Take 1 tablet (0.5 mg total) by mouth at bedtime.  Encounter for screening for lipid disorder -     Lipid panel  Low vitamin D level -     VITAMIN D 25 Hydroxy (Vit-D Deficiency, Fractures)  Morbid obesity ([place]) Comments: Significant weight gain over the past several months. After discussion, patient would like to start below medication. Expectations, risks, and potential side effects reviewed. Will recheck in 6 weeks. The patient is asked to make an attempt to improve diet and exercise  patterns to aid in medical management of this problem.  Orders: -     Phentermine-Topiramate 3.75-23 MG CP24; Take 1 tablet by mouth daily. -     Phentermine-Topiramate 7.5-46 MG CP24; Take 1 tablet by mouth daily.  . Reviewed expectations re: course of current medical issues. . Discussed self-management of symptoms. . Outlined signs and symptoms indicating need for more acute intervention. . Patient verbalized understanding and all questions were answered. Marland Kitchen Health Maintenance issues including appropriate healthy diet, exercise, and smoking avoidance were discussed with patient. . See orders for this visit as documented in the electronic medical record. . Patient received an After Visit Summary.  CMA served as Education administrator during this visit. History, Physical, and Plan performed by medical provider. The above documentation has been reviewed and is accurate and  complete. [name], D.O.  Eileen Deutscher, DO [place] [place] 03/20/2017  Future Appointments Date Time Provider [place]  04/24/2017 7:45 AM Eileen Deutscher, DO [facility] None

## 2017-03-21 ENCOUNTER — Encounter: Payer: Self-pay | Admitting: Family Medicine

## 2017-03-21 ENCOUNTER — Encounter: Payer: Self-pay | Admitting: Surgical

## 2017-03-21 ENCOUNTER — Telehealth: Payer: Self-pay

## 2017-03-21 NOTE — Telephone Encounter (Signed)
Qsymia 7.5-45 mg approved through 09/17/2017.  This was faxed to patient's pharmacy.  They are asked to direct patient to the Qsymia website for a coupon to help the patient pay for the 2 week started dose.

## 2017-03-22 ENCOUNTER — Other Ambulatory Visit: Payer: Self-pay | Admitting: Surgical

## 2017-03-22 MED ORDER — CHOLECALCIFEROL 1.25 MG (50000 UT) PO TABS: ORAL_TABLET | ORAL | 0 refills | Status: DC

## 2017-03-24 ENCOUNTER — Encounter: Payer: Self-pay | Admitting: Family Medicine

## 2017-03-28 ENCOUNTER — Telehealth: Payer: Self-pay | Admitting: Family Medicine

## 2017-03-28 NOTE — Telephone Encounter (Signed)
ROI faxed to [facility], [facility] and [place]

## 2017-03-30 ENCOUNTER — Ambulatory Visit (INDEPENDENT_AMBULATORY_CARE_PROVIDER_SITE_OTHER): Payer: BLUE CROSS/BLUE SHIELD | Admitting: Family Medicine

## 2017-03-30 ENCOUNTER — Encounter: Payer: Self-pay | Admitting: Family Medicine

## 2017-03-30 VITALS — BP 122/88 | HR 74 | Temp 98.1°F | Ht 64.0 in | Wt 256.4 lb

## 2017-03-30 DIAGNOSIS — F331 Major depressive disorder, recurrent, moderate: Secondary | ICD-10-CM | POA: Diagnosis not present

## 2017-03-30 DIAGNOSIS — R7303 Prediabetes: Secondary | ICD-10-CM | POA: Diagnosis not present

## 2017-03-30 DIAGNOSIS — R7989 Other specified abnormal findings of blood chemistry: Secondary | ICD-10-CM

## 2017-03-30 DIAGNOSIS — E7800 Pure hypercholesterolemia, unspecified: Secondary | ICD-10-CM | POA: Diagnosis not present

## 2017-03-30 DIAGNOSIS — E559 Vitamin D deficiency, unspecified: Secondary | ICD-10-CM

## 2017-03-30 MED ORDER — BUPROPION HCL ER (XL) 150 MG PO TB24: ORAL_TABLET | ORAL | 0 refills | Status: DC

## 2017-03-30 NOTE — Progress Notes (Signed)
[name] is a 46 y.o. female is here for follow up.  History of Present Illness:  [name] CMA acting as scribe for Dr. Juleen China.  HPI: See Assessment and Plan section for Problem Based Charting of issues discussed today.  Health Maintenance Due  Topic Date Due  . INFLUENZA VACCINE  03/08/2017   Depression screen PHQ 2/9 03/30/2017  Decreased Interest 3  Down, Depressed, Hopeless 3  PHQ - 2 Score 6  Altered sleeping 3  Tired, decreased energy 2  Change in appetite 3  Feeling bad or failure about yourself  3  Trouble concentrating 3  Moving slowly or fidgety/restless 0  Suicidal thoughts 1  PHQ-9 Score 21  Difficult doing work/chores Somewhat difficult  She PMHx, SurgHx, SocialHx, FamHx, Medications, and Allergies were reviewed in the Visit Navigator and updated as appropriate.   Patient Active Problem List   Diagnosis Date Noted  . Pure hypercholesterolemia 04/02/2017  . Prediabetes 04/02/2017  . Morbid obesity ([place]) 03/20/2017  . Hematuria 03/20/2017  . Migraine with aura and without status migrainosus, not intractable 03/20/2017  . Low vitamin D level 03/20/2017  . Iron deficiency anemia due to chronic blood loss 10/15/2015  . Moderate episode of recurrent major depressive disorder ([place]) 10/15/2015   Social History  Substance Use Topics  . Smoking status: Never Smoker  . Smokeless tobacco: Never Used  . Alcohol use No   Current Medications and Allergies:   Current Outpatient Prescriptions:  .  Cholecalciferol 50000 units TABS, 50,000 units PO qwk for 8 weeks., Disp: 8 tablet, Rfl: 0 .  naproxen (NAPROSYN) 500 MG tablet, TK 1 T PO BID, Disp: , Rfl: 0 .  buPROPion (WELLBUTRIN XL) 150 MG 24 hr tablet, Take one 150 mg tablet daily., Disp: 30 tablet, Rfl: 0 .  LORazepam (ATIVAN) 0.5 MG tablet, Take 1 tablet (0.5 mg total) by mouth at bedtime. (Patient not taking: Reported on 03/30/2017), Disp: 20 tablet, Rfl: 0   Allergies  Allergen Reactions  .  Penicillins Other (See Comments)    Childhood rxn Has patient had a PCN reaction causing immediate rash, facial/tongue/throat swelling, SOB or lightheadedness with hypotension: No Has patient had a PCN reaction causing severe rash involving mucus membranes or skin necrosis: No Has patient had a PCN reaction that required hospitalization No Has patient had a PCN reaction occurring within the last 10 years: No If all of the above answers are "NO", then may proceed with Cephalosporin use.    Review of Systems   Pertinent items are noted in the HPI. Otherwise, ROS is negative.  Vitals:   Vitals:   03/30/17 0953  BP: 122/88  Pulse: 74  Temp: 98.1 F (36.7 C)  TempSrc: Oral  SpO2: 98%  Weight: 256 lb 6.4 oz (116.3 kg)  Height: 5\' 4"  (1.626 m)     Body mass index is 44.01 kg/m. Physical Exam:   Physical Exam  Constitutional: She appears well-nourished.  HENT:  Head: Normocephalic and atraumatic.  Eyes: Pupils are equal, round, and reactive to light. EOM are normal.  Neck: Normal range of motion. Neck supple.  Cardiovascular: Normal rate, regular rhythm, normal heart sounds and intact distal pulses.   Pulmonary/Chest: Effort normal.  Abdominal: Soft.  Skin: Skin is warm.  Psychiatric: Her affect is labile. Her speech is rapid and/or pressured.  Nursing note and vitals reviewed.  Results for orders placed or performed in visit on 03/20/17  CBC with Differential/Platelet  Result Value Ref Range  WBC 5.4 4.0 - 10.5 K/uL   RBC 4.08 3.87 - 5.11 Mil/uL   Hemoglobin 12.0 12.0 - 15.0 g/dL   HCT 36.9 36.0 - 46.0 %   MCV 90.6 78.0 - 100.0 fl   MCHC 32.6 30.0 - 36.0 g/dL   RDW 14.8 11.5 - 15.5 %   Platelets 322.0 150.0 - 400.0 K/uL   Neutrophils Relative % 56.4 43.0 - 77.0 %   Lymphocytes Relative 31.3 12.0 - 46.0 %   Monocytes Relative 9.2 3.0 - 12.0 %   Eosinophils Relative 2.7 0.0 - 5.0 %   Basophils Relative 0.4 0.0 - 3.0 %   Neutro Abs 3.0 1.4 - 7.7 K/uL   Lymphs Abs  1.7 0.7 - 4.0 K/uL   Monocytes Absolute 0.5 0.1 - 1.0 K/uL   Eosinophils Absolute 0.1 0.0 - 0.7 K/uL   Basophils Absolute 0.0 0.0 - 0.1 K/uL  Comprehensive metabolic panel  Result Value Ref Range   Sodium 136 135 - 145 mEq/L   Potassium 4.2 3.5 - 5.1 mEq/L   Chloride 101 96 - 112 mEq/L   CO2 30 19 - 32 mEq/L   Glucose, Bld 88 70 - 99 mg/dL   BUN 13 6 - 23 mg/dL   Creatinine, Ser 0.78 0.40 - 1.20 mg/dL   Total Bilirubin 0.3 0.2 - 1.2 mg/dL   Alkaline Phosphatase 62 39 - 117 U/L   AST 13 0 - 37 U/L   ALT 10 0 - 35 U/L   Total Protein 6.7 6.0 - 8.3 g/dL   Albumin 3.7 3.5 - 5.2 g/dL   Calcium 8.7 8.4 - 10.5 mg/dL   GFR 101.86 >60.00 mL/min  Hemoglobin A1c  Result Value Ref Range   Hgb A1c MFr Bld 6.3 4.6 - 6.5 %  TSH  Result Value Ref Range   TSH 3.80 0.35 - 4.50 uIU/mL  VITAMIN D 25 Hydroxy (Vit-D Deficiency, Fractures)  Result Value Ref Range   VITD 14.19 (L) 30.00 - 100.00 ng/mL  Lipid panel  Result Value Ref Range   Cholesterol 263 (H) 0 - 200 mg/dL   Triglycerides 95.0 0.0 - 149.0 mg/dL   HDL 41.20 >39.00 mg/dL   VLDL 19.0 0.0 - 40.0 mg/dL   LDL Cholesterol 203 (H) 0 - 99 mg/dL   Total CHOL/HDL Ratio 6    NonHDL 221.89   Urinalysis, Routine w reflex microscopic  Result Value Ref Range   Color, Urine YELLOW Yellow;Lt. Yellow   APPearance CLEAR Clear   Specific Gravity, Urine 1.020 1.000 - 1.030   pH 6.0 5.0 - 8.0   Total Protein, Urine NEGATIVE Negative   Urine Glucose NEGATIVE Negative   Ketones, ur NEGATIVE Negative   Bilirubin Urine NEGATIVE Negative   Hgb urine dipstick NEGATIVE Negative   Urobilinogen, UA 0.2 0.0 - 1.0   Leukocytes, UA NEGATIVE Negative   Nitrite NEGATIVE Negative   WBC, UA 0-2/hpf 0-2/hpf   RBC / HPF 0-2/hpf 0-2/hpf   Squamous Epithelial / LPF Rare(0-4/hpf) Rare(0-4/hpf)  Magnesium  Result Value Ref Range   Magnesium 1.7 1.5 - 2.5 mg/dL    Assessment and Plan:   Patient comes in today for a reaction from the Qsymia. She was have  body aches and some tingling in her hands.   Anxiety: Patient states that when she gets in the car she has a panic attack. This is something new for her. Patient is emotional today. Patients mother told her that she was acting like she did a couple years  ago. Patient stated that she almost called [facility] the other day, but decided not to. Discussed that she really needs to speak with someone about her feelings. Family members have noticed the difference in her mood and suggested that she start volunteering to get her mind off of things. She was on Wellbutrin in the past and also jogging while taking this. She felt a lot better then. Patient has been to mental health in the past for talking about suicide with a friend. The friend called her mother and her mother had the police take her. Will get a PHQ 9 and GAD 7. Will prescribe Wellbutrin today. Discussed the importance of her talking with Lattie Haw and possibly psychology.     Bipolar: Provider discussed maybe going on something for Bipolar. Patient stated that nobody has ever discussed this with her. She did say that in the past she had done testing for adult ADHD and was diagnosed with adult ADHD. She has trouble focusing. She was never offered medication for the ADHD. We will get records from [place] for the ADHD testing.   Kambri was seen today for medication reaction.  Diagnoses and all orders for this visit:  Moderate episode of recurrent major depressive disorder ([facility]) -     buPROPion (WELLBUTRIN XL) 150 MG 24 hr tablet; Take one 150 mg tablet daily.  Morbid obesity ([place])  Low vitamin D level  Pure hypercholesterolemia  Prediabetes    . Reviewed expectations re: course of current medical issues. . Discussed self-management of symptoms. . Outlined signs and symptoms indicating need for more acute intervention. . Patient verbalized understanding and all questions were answered. Marland Kitchen Health Maintenance issues including appropriate healthy  diet, exercise, and smoking avoidance were discussed with patient. . See orders for this visit as documented in the electronic medical record. . Patient received an After Visit Summary.  CMA served as Education administrator during this visit. History, Physical, and Plan performed by medical provider. The above documentation has been reviewed and is accurate and complete. Briscoe Deutscher, D.O.  Briscoe Deutscher, DO [place], [facility] 04/02/2017  Future Appointments Date Time Provider Malcolm  04/24/2017 7:45 AM Briscoe Deutscher, DO [facility] None

## 2017-04-02 DIAGNOSIS — E7800 Pure hypercholesterolemia, unspecified: Secondary | ICD-10-CM | POA: Insufficient documentation

## 2017-04-02 DIAGNOSIS — E88819 Insulin resistance, unspecified: Secondary | ICD-10-CM | POA: Insufficient documentation

## 2017-04-02 DIAGNOSIS — E8881 Metabolic syndrome: Secondary | ICD-10-CM | POA: Insufficient documentation

## 2017-04-04 NOTE — Telephone Encounter (Signed)
Rec'd from [place]

## 2017-04-05 ENCOUNTER — Encounter: Payer: Self-pay | Admitting: Family Medicine

## 2017-04-06 ENCOUNTER — Other Ambulatory Visit: Payer: Self-pay | Admitting: Surgical

## 2017-04-06 MED ORDER — LISDEXAMFETAMINE DIMESYLATE 30 MG PO CAPS: 30.0000 mg | ORAL_CAPSULE | Freq: Every day | ORAL | 0 refills | Status: DC

## 2017-04-11 ENCOUNTER — Encounter: Payer: Self-pay | Admitting: Family Medicine

## 2017-04-12 ENCOUNTER — Encounter: Payer: Self-pay | Admitting: Family Medicine

## 2017-04-24 ENCOUNTER — Ambulatory Visit: Payer: BLUE CROSS/BLUE SHIELD | Admitting: Family Medicine

## 2017-09-14 ENCOUNTER — Telehealth: Payer: Self-pay

## 2017-09-14 NOTE — Telephone Encounter (Signed)
Called l/m for pt to call office so that we can update her health maintenance.

## 2017-11-01 DIAGNOSIS — R200 Anesthesia of skin: Secondary | ICD-10-CM | POA: Diagnosis not present

## 2017-11-01 DIAGNOSIS — R0789 Other chest pain: Secondary | ICD-10-CM | POA: Diagnosis not present

## 2017-11-01 DIAGNOSIS — R51 Headache: Secondary | ICD-10-CM | POA: Diagnosis not present

## 2017-11-01 DIAGNOSIS — R079 Chest pain, unspecified: Secondary | ICD-10-CM | POA: Diagnosis not present

## 2017-11-03 ENCOUNTER — Ambulatory Visit: Payer: Self-pay | Admitting: *Deleted

## 2017-11-03 NOTE — Telephone Encounter (Signed)
See note

## 2017-11-03 NOTE — Telephone Encounter (Signed)
Pt  Denies   Any  Chest  Pain  Or  Shortness  Of  Breath  At  This  Time.   Pt  Was   Seen er  In  [place]   2  Days  Ago  And  Had   What  She  Describes  As  A  Negative Ekg   And  CXR   Pt  Reports  Has  Had  Similar  Episodes  In past.Appointment  Made  With Dr  Juleen China  For  Monday AM.   Pt advised  To  Call  911  If  Symptoms  Become  Severe.      Reason for Disposition . [1] MODERATE pain (e.g., interferes with normal activities) AND [2] present > 3 days  Answer Assessment - Initial Assessment Questions 1. ONSET: "When did the pain start?"       Year  And  1/2   Ago  -  Pt    Severe  2  Days  Ago   Got  Better  Yesterday          Came  Back  Severe  1  Hour   Ago     2. LOCATION: "Where is the pain located?"          l Shoulder   And  l  Arm  Gets  Numb  And  Tingling   As  Well as  l  Leg  Tingling   On going   3. PAIN: "How bad is the pain?" (Scale 1-10; or mild, moderate, severe)   - MILD (1-3): doesn't interfere with normal activities   - MODERATE (4-7): interferes with normal activities (e.g., work or school) or awakens from sleep   - SEVERE (8-10): excruciating pain, unable to do any normal activities, unable to move arm at all due to pain        Pain is  A  5  At this  Time        4. WORK OR EXERCISE: "Has there been any recent work or exercise that involved this part of the body?"      no 5. CAUSE: "What do you think is causing the shoulder pain?"       No  Sure     6. OTHER SYMPTOMS: "Do you have any other symptoms?" (e.g., neck pain, swelling, rash, fever, numbness, weakness)       No  Chest  Pain  No  Shortness   Of  Breath    7. PREGNANCY: "Is there any chance you are pregnant?" "When was your last menstrual period?"      n/a  Protocols used: SHOULDER PAIN-A-AH

## 2017-11-03 NOTE — Telephone Encounter (Signed)
I'm happy to see her next week. If can't wait, to [facility] or Sat clinic.

## 2017-11-03 NOTE — Telephone Encounter (Signed)
Patient is fine with wait just wanted to make sure ok with provider.

## 2017-11-03 NOTE — Telephone Encounter (Signed)
Ok to hold off until next week or do you want seen sooner?

## 2017-11-06 ENCOUNTER — Ambulatory Visit (INDEPENDENT_AMBULATORY_CARE_PROVIDER_SITE_OTHER): Payer: 59 | Admitting: Family Medicine

## 2017-11-06 ENCOUNTER — Encounter: Payer: Self-pay | Admitting: Family Medicine

## 2017-11-06 VITALS — BP 124/84 | HR 72 | Temp 98.6°F | Ht 64.0 in | Wt 254.0 lb

## 2017-11-06 DIAGNOSIS — E7800 Pure hypercholesterolemia, unspecified: Secondary | ICD-10-CM | POA: Diagnosis not present

## 2017-11-06 DIAGNOSIS — R7303 Prediabetes: Secondary | ICD-10-CM

## 2017-11-06 DIAGNOSIS — R5383 Other fatigue: Secondary | ICD-10-CM | POA: Diagnosis not present

## 2017-11-06 DIAGNOSIS — R079 Chest pain, unspecified: Secondary | ICD-10-CM | POA: Diagnosis not present

## 2017-11-06 LAB — CBC WITH DIFFERENTIAL/PLATELET
Basophils Absolute: 0 10*3/uL (ref 0.0–0.1)
Basophils Relative: 0.7 % (ref 0.0–3.0)
Eosinophils Absolute: 0.1 10*3/uL (ref 0.0–0.7)
Eosinophils Relative: 1.8 % (ref 0.0–5.0)
HCT: 39.2 % (ref 36.0–46.0)
Hemoglobin: 13 g/dL (ref 12.0–15.0)
Lymphocytes Relative: 34.9 % (ref 12.0–46.0)
Lymphs Abs: 1.6 10*3/uL (ref 0.7–4.0)
MCHC: 33.2 g/dL (ref 30.0–36.0)
MCV: 90.6 fl (ref 78.0–100.0)
Monocytes Absolute: 0.4 10*3/uL (ref 0.1–1.0)
Monocytes Relative: 9.5 % (ref 3.0–12.0)
Neutro Abs: 2.4 10*3/uL (ref 1.4–7.7)
Neutrophils Relative %: 53.1 % (ref 43.0–77.0)
Platelets: 382 10*3/uL (ref 150.0–400.0)
RBC: 4.33 Mil/uL (ref 3.87–5.11)
RDW: 14.9 % (ref 11.5–15.5)
WBC: 4.5 10*3/uL (ref 4.0–10.5)

## 2017-11-06 LAB — COMPREHENSIVE METABOLIC PANEL
ALT: 15 U/L (ref 0–35)
AST: 17 U/L (ref 0–37)
Albumin: 3.8 g/dL (ref 3.5–5.2)
Alkaline Phosphatase: 58 U/L (ref 39–117)
BUN: 12 mg/dL (ref 6–23)
CO2: 32 mEq/L (ref 19–32)
Calcium: 9.1 mg/dL (ref 8.4–10.5)
Chloride: 100 mEq/L (ref 96–112)
Creatinine, Ser: 0.81 mg/dL (ref 0.40–1.20)
GFR: 97.26 mL/min (ref >60.00)
Glucose, Bld: 106 mg/dL — ABNORMAL HIGH (ref 70–99)
Potassium: 4.8 mEq/L (ref 3.5–5.1)
Sodium: 136 mEq/L (ref 135–145)
Total Bilirubin: 0.3 mg/dL (ref 0.2–1.2)
Total Protein: 7.5 g/dL (ref 6.0–8.3)

## 2017-11-06 LAB — HEMOGLOBIN A1C: Hgb A1c MFr Bld: 6.1 % (ref 4.6–6.5)

## 2017-11-06 NOTE — Progress Notes (Signed)
[name] is a 47 y.o. female here for an acute visit.  History of Present Illness:   Lonell Grandchild, CMA acting as scribe for Dr. Briscoe Deutscher.   HPI: Left shoulder pain with radiation of pain and numbness in to arm down hand. Pain also goes down back into side. When she has symptoms it can get up to a 10/10. She also has noticed that when it happens she has dizziness and heat in the area. Pain is present all the time but has times when it is much more present. Seen at [facility] ED 11/01/17.  She left with out treatment. When home she tried over the counter treatments.   She was able to move both arms and neck as directed by provider with no increased pain. She can not think of any movement that will bring on the pain. She also has not noticed any improvement with position change.   She had been seen for this in the past she was for this in the past but had had improved for a while after change in job. She had been at the same job for 19 years. She has started new job at the first of this year. She is very pleased with the new transition.              PMHx, SurgHx, SocialHx, Medications, and Allergies were reviewed in the Visit Navigator and updated as appropriate.  Current Medications:   .  naproxen (NAPROSYN) 500 MG tablet, TK 1 T PO BID, Disp: , Rfl: 0   Allergies  Allergen Reactions  . Penicillins Other (See Comments)    Childhood rxn Has patient had a PCN reaction causing immediate rash, facial/tongue/throat swelling, SOB or lightheadedness with hypotension: No Has patient had a PCN reaction causing severe rash involving mucus membranes or skin necrosis: No Has patient had a PCN reaction that required hospitalization No Has patient had a PCN reaction occurring within the last 10 years: No If all of the above answers are "NO", then may proceed with Cephalosporin use.    Review of Systems:   Pertinent items are noted in the HPI. Otherwise, ROS is negative.  Vitals:    Vitals:   11/06/17 0947  BP: 124/84  Pulse: 72  Temp: 98.6 F (37 C)  TempSrc: Oral  SpO2: 97%  Weight: 254 lb (115.2 kg)  Height: 5\' 4"  (1.626 m)     Body mass index is 43.6 kg/m.  Physical Exam:   Physical Exam  Constitutional: She is oriented to person, place, and time. She appears well-developed and well-nourished. No distress.  HENT:  Head: Normocephalic and atraumatic.  Right Ear: External ear normal.  Left Ear: External ear normal.  Nose: Nose normal.  Mouth/Throat: Oropharynx is clear and moist.  Eyes: Pupils are equal, round, and reactive to light. Conjunctivae and EOM are normal.  Neck: Normal range of motion. Neck supple. No thyromegaly present.  Cardiovascular: Normal rate, regular rhythm, normal heart sounds and intact distal pulses.  Pulmonary/Chest: Effort normal and breath sounds normal.  Abdominal: Soft. Bowel sounds are normal.  Musculoskeletal: Normal range of motion.  Lymphadenopathy:    She has no cervical adenopathy.  Neurological: She is alert and oriented to person, place, and time.  Skin: Skin is warm and dry. Capillary refill takes less than 2 seconds.  Psychiatric: She has a normal mood and affect. Her behavior is normal.  Nursing note and vitals reviewed.   Results for orders placed or performed in  visit on 11/06/17  CBC with Differential/Platelet  Result Value Ref Range   WBC 4.5 4.0 - 10.5 K/uL   RBC 4.33 3.87 - 5.11 Mil/uL   Hemoglobin 13.0 12.0 - 15.0 g/dL   HCT 39.2 36.0 - 46.0 %   MCV 90.6 78.0 - 100.0 fl   MCHC 33.2 30.0 - 36.0 g/dL   RDW 14.9 11.5 - 15.5 %   Platelets 382.0 150.0 - 400.0 K/uL   Neutrophils Relative % 53.1 43.0 - 77.0 %   Lymphocytes Relative 34.9 12.0 - 46.0 %   Monocytes Relative 9.5 3.0 - 12.0 %   Eosinophils Relative 1.8 0.0 - 5.0 %   Basophils Relative 0.7 0.0 - 3.0 %   Neutro Abs 2.4 1.4 - 7.7 K/uL   Lymphs Abs 1.6 0.7 - 4.0 K/uL   Monocytes Absolute 0.4 0.1 - 1.0 K/uL   Eosinophils Absolute 0.1 0.0  - 0.7 K/uL   Basophils Absolute 0.0 0.0 - 0.1 K/uL  Comprehensive metabolic panel  Result Value Ref Range   Sodium 136 135 - 145 mEq/L   Potassium 4.8 3.5 - 5.1 mEq/L   Chloride 100 96 - 112 mEq/L   CO2 32 19 - 32 mEq/L   Glucose, Bld 106 (H) 70 - 99 mg/dL   BUN 12 6 - 23 mg/dL   Creatinine, Ser 0.81 0.40 - 1.20 mg/dL   Total Bilirubin 0.3 0.2 - 1.2 mg/dL   Alkaline Phosphatase 58 39 - 117 U/L   AST 17 0 - 37 U/L   ALT 15 0 - 35 U/L   Total Protein 7.5 6.0 - 8.3 g/dL   Albumin 3.8 3.5 - 5.2 g/dL   Calcium 9.1 8.4 - 10.5 mg/dL   GFR 97.26 >60.00 mL/min  Hemoglobin A1c  Result Value Ref Range   Hgb A1c MFr Bld 6.1 4.6 - 6.5 %   Assessment and Plan:   1. Chest pain, unspecified type No red flags today. Likely anxiety +/- MSK. Will order CT to check.  - CBC with Differential/Platelet - Comprehensive metabolic panel - CT CARDIAC SCORING; Future  2. Morbid obesity ([place]) The patient is asked to make an attempt to improve diet and exercise patterns to aid in medical management of this problem.   3. Pure hypercholesterolemia  4. Prediabetes - Hemoglobin A1c   . Reviewed expectations re: course of current medical issues. . Discussed self-management of symptoms. . Outlined signs and symptoms indicating need for more acute intervention. . Patient verbalized understanding and all questions were answered. Marland Kitchen Health Maintenance issues including appropriate healthy diet, exercise, and smoking avoidance were discussed with patient. . See orders for this visit as documented in the electronic medical record. . Patient received an After Visit Summary.  CMA served as Education administrator during this visit. History, Physical, and Plan performed by medical provider. The above documentation has been reviewed and is accurate and complete. Briscoe Deutscher, D.O.  Briscoe Deutscher, DO [place] 11/06/2017

## 2017-11-15 ENCOUNTER — Ambulatory Visit (INDEPENDENT_AMBULATORY_CARE_PROVIDER_SITE_OTHER)
Admission: RE | Admit: 2017-11-15 | Discharge: 2017-11-15 | Disposition: A | Discharge status: Home / Self Care | Payer: 59 | Source: Ambulatory Visit | Attending: Family Medicine | Admitting: Family Medicine

## 2017-11-15 DIAGNOSIS — R079 Chest pain, unspecified: Secondary | ICD-10-CM

## 2017-11-22 ENCOUNTER — Other Ambulatory Visit: Payer: 59

## 2018-02-04 NOTE — Progress Notes (Signed)
[name] is a 47 y.o. female is here for follow up.  History of Present Illness:   HPI:   1. Morbid obesity ([place]).   Wt Readings from Last 3 Encounters:  02/05/18 256 lb 3.2 oz (116.2 kg)  11/06/17 254 lb (115.2 kg)  03/30/17 256 lb 6.4 oz (116.3 kg)    2. Insulin resistance. A1c down from 6.1 to 5.7.    3. Pure hypercholesterolemia.   Lab Results  Component Value Date   CHOL 283 (H) 02/05/2018   HDL 40.00 02/05/2018   LDLCALC 217 (H) 02/05/2018   TRIG 126.0 02/05/2018   CHOLHDL 7 02/05/2018   Lab Results  Component Value Date   ALT 11 02/05/2018   AST 13 02/05/2018   ALKPHOS 63 02/05/2018   BILITOT 0.3 02/05/2018      The 10-year ASCVD risk score Mikey Bussing DC Jr., et al., 2013) is: 4.5%   Values used to calculate the score:     Age: 51 years     Sex: Female     Is Non-Hispanic African American: Yes     Diabetic: No     Tobacco smoker: No     Systolic Blood Pressure: 564 mmHg     Is BP treated: No     HDL Cholesterol: 40 mg/dL     Total Cholesterol: 283 mg/dL  Recent Cardiac CT Score was zero.  Health Maintenance Due  Topic Date Due  . MAMMOGRAM  06/09/2017   Depression screen [facility] 2/9 02/05/2018 03/30/2017  Decreased Interest 3 3  Down, Depressed, Hopeless 3 3  PHQ - 2 Score 6 6  Altered sleeping 3 3  Tired, decreased energy 3 2  Change in appetite 3 3  Feeling bad or failure about yourself  2 3  Trouble concentrating 3 3  Moving slowly or fidgety/restless 2 0  Suicidal thoughts 0 1  PHQ-9 Score 22 21  Difficult doing work/chores Very difficult Somewhat difficult   PMHx, SurgHx, SocialHx, FamHx, Medications, and Allergies were reviewed in the Visit Navigator and updated as appropriate.   Patient Active Problem List   Diagnosis Date Noted  . Pure hypercholesterolemia 04/02/2017  . Insulin resistance 04/02/2017  . Morbid obesity ([place]) 03/20/2017  . Hematuria 03/20/2017  . Migraine with aura and without status migrainosus, not intractable  03/20/2017  . Low vitamin D level 03/20/2017  . Iron deficiency anemia due to chronic blood loss 10/15/2015  . Moderate episode of recurrent major depressive disorder ([place]) 10/15/2015   Social History   Tobacco Use  . Smoking status: Never Smoker  . Smokeless tobacco: Never Used  Substance Use Topics  . Alcohol use: No  . Drug use: No   Current Medications and Allergies:   Current Outpatient Medications:  .  amphetamine-dextroamphetamine (ADDERALL) 20 MG tablet, Take 1 tablet (20 mg total) by mouth 2 (two) times daily., Disp: 60 tablet, Rfl: 0   Allergies  Allergen Reactions  . Penicillins Other (See Comments)    Childhood rxn Has patient had a PCN reaction causing immediate rash, facial/tongue/throat swelling, SOB or lightheadedness with hypotension: No Has patient had a PCN reaction causing severe rash involving mucus membranes or skin necrosis: No Has patient had a PCN reaction that required hospitalization No Has patient had a PCN reaction occurring within the last 10 years: No If all of the above answers are "NO", then may proceed with Cephalosporin use.    Review of Systems   Pertinent items are noted in the HPI.  Otherwise, ROS is negative.  Vitals:   Vitals:   02/05/18 0702  BP: 138/72  Pulse: 86  Temp: 98.4 F (36.9 C)  TempSrc: Oral  SpO2: 96%  Weight: 256 lb 3.2 oz (116.2 kg)     Body mass index is 43.98 kg/m.  Physical Exam:   Physical Exam  Constitutional: She is oriented to person, place, and time. She appears well-developed and well-nourished. No distress.  HENT:  Head: Normocephalic and atraumatic.  Right Ear: External ear normal.  Left Ear: External ear normal.  Nose: Nose normal.  Mouth/Throat: Oropharynx is clear and moist.  Eyes: Pupils are equal, round, and reactive to light. Conjunctivae and EOM are normal.  Neck: Normal range of motion. Neck supple. No thyromegaly present.  Cardiovascular: Normal rate, regular rhythm, normal heart  sounds and intact distal pulses.  Pulmonary/Chest: Effort normal and breath sounds normal.  Abdominal: Soft. Bowel sounds are normal.  Musculoskeletal: Normal range of motion.  Lymphadenopathy:    She has no cervical adenopathy.  Neurological: She is alert and oriented to person, place, and time.  Skin: Skin is warm and dry. Capillary refill takes less than 2 seconds.  Psychiatric: She has a normal mood and affect. Her behavior is normal.  Nursing note and vitals reviewed.    Assessment and Plan:   [name] was seen today for follow-up.  Diagnoses and all orders for this visit:  Insulin resistance Comments: Improved.  Orders: -     POCT glycosylated hemoglobin (Hb A1C)  Morbid obesity ([place]) Comments: Patient is interested ib Bariatric Surgery. Will put in referral.  Orders: -     Amb Referral to Bariatric Surgery  Pure hypercholesterolemia -     Comprehensive metabolic panel -     Lipid panel  Low vitamin D level -     VITAMIN D 25 Hydroxy (Vit-D Deficiency, Fractures)  Iron deficiency anemia due to chronic blood loss -     CBC with Differential/Platelet  Attention deficit hyperactivity disorder (ADHD), predominantly inattentive type Comments:    Adult ADHD Self Report Scale (most recent)    Adult ADHD Self-Report Scale (ASRS-v1.1) Symptom Checklist - 02/05/18 1057      Part A   1. How often do you have trouble wrapping up the final details of a project, once the challenging parts have been done?  Very Often  (Abnormal)   2. How often do you have difficulty getting things done in order when you have to do a task that requires organization?  Very Often  (Abnormal)     3. How often do you have problems remembering appointments or obligations?  Very Often  (Abnormal)   4. When you have a task that requires a lot of thought, how often do you avoid or delay getting started?  Very Often  (Abnormal)     5. How often do you fidget or squirm with your hands or feet when you  have to sit down for a long time?  Often  (Abnormal)   6. How often do you feel overly active and compelled to do things, like you were driven by a motor?  Sometimes      Part B   7. How often do you make careless mistakes when you have to work on a boring or difficult project?  Very Often  (Abnormal)   8. How often do you have difficulty keeping your attention when you are doing boring or repetitive work?  Very Often  (Abnormal)  9. How often do you have difficulty concentrating on what people say to you, even when they are speaking to you directly?  Very Often  (Abnormal)   10. How often do you misplace or have difficulty finding things at home or at work?  Often  (Abnormal)     11. How often are you distracted by activity or noise around you?  Often  (Abnormal)   12. How often do you leave your seat in meetings or other situations in which you are expected to remain seated?  Rarely    13. How often do you feel restless or fidgety?  Often  (Abnormal)   14. How often do you have difficulty unwinding and relaxing when you have time to yourself?  Very Often  (Abnormal)     16. When you are in a conversation, how often do you find yourself finishing the sentences of the people you are talking to, before they can finish them themselves?  Often  (Abnormal)   17. How often do you have difficulty waiting your turn in situations when turn taking is required?  Rarely    18. How often do you interrupt others when they are busy?  Rarely        The benefits of stimulant medication treatment appear to outweigh the current risks. We discussed risks and benefits of different medications. I recommended and discussed appropriate dietary modifications and routine exercise.  Stimulant medication management, careful titration, and dose optimization of stimulant medication was discussed as the treatment option with the best scientific evidence helping reduce ADHD symptoms. There is no cure for ADHD.  Stimulant  medication side effects: The temporary side effects of stimulants including decreased appetite, sleep disturbance, mood changes, personality changes, ticks, increases in heart rate and blood pressure, abdominal pain, nausea, vomiting, and dry mouth were discussed.   Orders: -     amphetamine-dextroamphetamine (ADDERALL) 20 MG tablet; Take 1 tablet (20 mg total) by mouth 2 (two) times daily.  Chronic periscapular pain on left side Comments: Intermittent. Improving. Work-up normal. She feels that it is due to stress.  Visit for screening mammogram -     MM 3D SCREEN BREAST BILATERAL; Future   . Reviewed expectations re: course of current medical issues. . Discussed self-management of symptoms. . Outlined signs and symptoms indicating need for more acute intervention. . Patient verbalized understanding and all questions were answered. Marland Kitchen Health Maintenance issues including appropriate healthy diet, exercise, and smoking avoidance were discussed with patient. . See orders for this visit as documented in the electronic medical record. . Patient received an After Visit Summary.  Briscoe Deutscher, DO [place], [place] 02/05/2018  Future Appointments  Date Time Provider [place]  03/13/2018  7:00 AM Briscoe Deutscher, DO [facility]

## 2018-02-05 ENCOUNTER — Ambulatory Visit (INDEPENDENT_AMBULATORY_CARE_PROVIDER_SITE_OTHER): Payer: 59 | Admitting: Family Medicine

## 2018-02-05 ENCOUNTER — Encounter: Payer: Self-pay | Admitting: Family Medicine

## 2018-02-05 VITALS — BP 138/72 | HR 86 | Temp 98.4°F | Wt 256.2 lb

## 2018-02-05 DIAGNOSIS — E7800 Pure hypercholesterolemia, unspecified: Secondary | ICD-10-CM

## 2018-02-05 DIAGNOSIS — F900 Attention-deficit hyperactivity disorder, predominantly inattentive type: Secondary | ICD-10-CM

## 2018-02-05 DIAGNOSIS — D500 Iron deficiency anemia secondary to blood loss (chronic): Secondary | ICD-10-CM

## 2018-02-05 DIAGNOSIS — Z1231 Encounter for screening mammogram for malignant neoplasm of breast: Secondary | ICD-10-CM

## 2018-02-05 DIAGNOSIS — E8881 Metabolic syndrome: Secondary | ICD-10-CM | POA: Diagnosis not present

## 2018-02-05 DIAGNOSIS — R7989 Other specified abnormal findings of blood chemistry: Secondary | ICD-10-CM

## 2018-02-05 DIAGNOSIS — M25512 Pain in left shoulder: Secondary | ICD-10-CM

## 2018-02-05 DIAGNOSIS — G8929 Other chronic pain: Secondary | ICD-10-CM

## 2018-02-05 LAB — CBC WITH DIFFERENTIAL/PLATELET
Basophils Absolute: 0 10*3/uL (ref 0.0–0.1)
Basophils Relative: 0.7 % (ref 0.0–3.0)
Eosinophils Absolute: 0.1 10*3/uL (ref 0.0–0.7)
Eosinophils Relative: 2.9 % (ref 0.0–5.0)
HCT: 37.1 % (ref 36.0–46.0)
Hemoglobin: 12.4 g/dL (ref 12.0–15.0)
Lymphocytes Relative: 35.5 % (ref 12.0–46.0)
Lymphs Abs: 1.7 10*3/uL (ref 0.7–4.0)
MCHC: 33.4 g/dL (ref 30.0–36.0)
MCV: 90 fl (ref 78.0–100.0)
Monocytes Absolute: 0.5 10*3/uL (ref 0.1–1.0)
Monocytes Relative: 11 % (ref 3.0–12.0)
Neutro Abs: 2.4 10*3/uL (ref 1.4–7.7)
Neutrophils Relative %: 49.9 % (ref 43.0–77.0)
Platelets: 378 10*3/uL (ref 150.0–400.0)
RBC: 4.13 Mil/uL (ref 3.87–5.11)
RDW: 14.4 % (ref 11.5–15.5)
WBC: 4.9 10*3/uL (ref 4.0–10.5)

## 2018-02-05 LAB — COMPREHENSIVE METABOLIC PANEL
ALT: 11 U/L (ref 0–35)
AST: 13 U/L (ref 0–37)
Albumin: 3.9 g/dL (ref 3.5–5.2)
Alkaline Phosphatase: 63 U/L (ref 39–117)
BUN: 10 mg/dL (ref 6–23)
CO2: 32 mEq/L (ref 19–32)
Calcium: 9 mg/dL (ref 8.4–10.5)
Chloride: 101 mEq/L (ref 96–112)
Creatinine, Ser: 0.85 mg/dL (ref 0.40–1.20)
GFR: 91.9 mL/min (ref >60.00)
Glucose, Bld: 101 mg/dL — ABNORMAL HIGH (ref 70–99)
Potassium: 4.7 mEq/L (ref 3.5–5.1)
Sodium: 139 mEq/L (ref 135–145)
Total Bilirubin: 0.3 mg/dL (ref 0.2–1.2)
Total Protein: 7.2 g/dL (ref 6.0–8.3)

## 2018-02-05 LAB — VITAMIN D 25 HYDROXY (VIT D DEFICIENCY, FRACTURES): VITD: 23.74 ng/mL — ABNORMAL LOW (ref 30.00–100.00)

## 2018-02-05 LAB — POCT GLYCOSYLATED HEMOGLOBIN (HGB A1C): Hemoglobin A1C: 5.7 % — AB (ref 4.0–5.6)

## 2018-02-05 LAB — LIPID PANEL
Cholesterol: 283 mg/dL — ABNORMAL HIGH (ref 0–200)
HDL: 40 mg/dL (ref >39.00)
LDL Cholesterol: 217 mg/dL — ABNORMAL HIGH (ref 0–99)
NonHDL: 242.65
Total CHOL/HDL Ratio: 7
Triglycerides: 126 mg/dL (ref 0.0–149.0)
VLDL: 25.2 mg/dL (ref 0.0–40.0)

## 2018-02-05 MED ORDER — AMPHETAMINE-DEXTROAMPHETAMINE 20 MG PO TABS: 20.0000 mg | ORAL_TABLET | Freq: Two times a day (BID) | ORAL | 0 refills | Status: DC

## 2018-02-05 MED ORDER — SIMVASTATIN 20 MG PO TABS: 20.0000 mg | ORAL_TABLET | Freq: Every day | ORAL | 3 refills | Status: DC

## 2018-02-05 NOTE — Addendum Note (Signed)
Addended by: Briscoe Deutscher R on: 02/05/2018 12:30 PM   Modules accepted: Orders

## 2018-03-13 ENCOUNTER — Ambulatory Visit: Payer: 59 | Admitting: Family Medicine

## 2018-03-13 DIAGNOSIS — Z0289 Encounter for other administrative examinations: Secondary | ICD-10-CM

## 2018-03-13 NOTE — Progress Notes (Deleted)
[name] is a 47 y.o. female is here for follow up.  History of Present Illness:   HPI: There are no preventive care reminders to display for this patient. Depression screen [facility] 2/9 02/05/2018 03/30/2017  Decreased Interest 3 3  Down, Depressed, Hopeless 3 3  PHQ - 2 Score 6 6  Altered sleeping 3 3  Tired, decreased energy 3 2  Change in appetite 3 3  Feeling bad or failure about yourself  2 3  Trouble concentrating 3 3  Moving slowly or fidgety/restless 2 0  Suicidal thoughts 0 1  PHQ-9 Score 22 21  Difficult doing work/chores Very difficult Somewhat difficult   PMHx, SurgHx, SocialHx, FamHx, Medications, and Allergies were reviewed in the Visit Navigator and updated as appropriate.   Patient Active Problem List   Diagnosis Date Noted  . Pure hypercholesterolemia 04/02/2017  . Insulin resistance 04/02/2017  . Morbid obesity ([place]) 03/20/2017  . Hematuria 03/20/2017  . Migraine with aura and without status migrainosus, not intractable 03/20/2017  . Low vitamin D level 03/20/2017  . Iron deficiency anemia due to chronic blood loss 10/15/2015  . Moderate episode of recurrent major depressive disorder ([place]) 10/15/2015   Social History   Tobacco Use  . Smoking status: Never Smoker  . Smokeless tobacco: Never Used  Substance Use Topics  . Alcohol use: No  . Drug use: No   Current Medications and Allergies:   Current Outpatient Medications:  .  amphetamine-dextroamphetamine (ADDERALL) 20 MG tablet, Take 1 tablet (20 mg total) by mouth 2 (two) times daily., Disp: 60 tablet, Rfl: 0 .  simvastatin (ZOCOR) 20 MG tablet, Take 1 tablet (20 mg total) by mouth at bedtime., Disp: 90 tablet, Rfl: 3  Allergies  Allergen Reactions  . Penicillins Other (See Comments)    Childhood rxn Has patient had a PCN reaction causing immediate rash, facial/tongue/throat swelling, SOB or lightheadedness with hypotension: No Has patient had a PCN reaction causing severe rash involving  mucus membranes or skin necrosis: No Has patient had a PCN reaction that required hospitalization No Has patient had a PCN reaction occurring within the last 10 years: No If all of the above answers are "NO", then may proceed with Cephalosporin use.    Review of Systems   Pertinent items are noted in the HPI. Otherwise, ROS is negative.  Vitals:  There were no vitals filed for this visit.   There is no height or weight on file to calculate BMI.  Physical Exam:   Physical Exam Results for orders placed or performed in visit on 02/05/18  CBC with Differential/Platelet  Result Value Ref Range   WBC 4.9 4.0 - 10.5 K/uL   RBC 4.13 3.87 - 5.11 Mil/uL   Hemoglobin 12.4 12.0 - 15.0 g/dL   HCT 37.1 36.0 - 46.0 %   MCV 90.0 78.0 - 100.0 fl   MCHC 33.4 30.0 - 36.0 g/dL   RDW 14.4 11.5 - 15.5 %   Platelets 378.0 150.0 - 400.0 K/uL   Neutrophils Relative % 49.9 43.0 - 77.0 %   Lymphocytes Relative 35.5 12.0 - 46.0 %   Monocytes Relative 11.0 3.0 - 12.0 %   Eosinophils Relative 2.9 0.0 - 5.0 %   Basophils Relative 0.7 0.0 - 3.0 %   Neutro Abs 2.4 1.4 - 7.7 K/uL   Lymphs Abs 1.7 0.7 - 4.0 K/uL   Monocytes Absolute 0.5 0.1 - 1.0 K/uL   Eosinophils Absolute 0.1 0.0 - 0.7 K/uL  Basophils Absolute 0.0 0.0 - 0.1 K/uL  Comprehensive metabolic panel  Result Value Ref Range   Sodium 139 135 - 145 mEq/L   Potassium 4.7 3.5 - 5.1 mEq/L   Chloride 101 96 - 112 mEq/L   CO2 32 19 - 32 mEq/L   Glucose, Bld 101 (H) 70 - 99 mg/dL   BUN 10 6 - 23 mg/dL   Creatinine, Ser 0.85 0.40 - 1.20 mg/dL   Total Bilirubin 0.3 0.2 - 1.2 mg/dL   Alkaline Phosphatase 63 39 - 117 U/L   AST 13 0 - 37 U/L   ALT 11 0 - 35 U/L   Total Protein 7.2 6.0 - 8.3 g/dL   Albumin 3.9 3.5 - 5.2 g/dL   Calcium 9.0 8.4 - 10.5 mg/dL   GFR 91.90 >60.00 mL/min  Lipid panel  Result Value Ref Range   Cholesterol 283 (H) 0 - 200 mg/dL   Triglycerides 126.0 0.0 - 149.0 mg/dL   HDL 40.00 >39.00 mg/dL   VLDL 25.2 0.0 - 40.0  mg/dL   LDL Cholesterol 217 (H) 0 - 99 mg/dL   Total CHOL/HDL Ratio 7    NonHDL 242.65   VITAMIN D 25 Hydroxy (Vit-D Deficiency, Fractures)  Result Value Ref Range   VITD 23.74 (L) 30.00 - 100.00 ng/mL  POCT glycosylated hemoglobin (Hb A1C)  Result Value Ref Range   Hemoglobin A1C 5.7 (A) 4.0 - 5.6 %   HbA1c POC (<> result, manual entry)  4.0 - 5.6 %   HbA1c, POC (prediabetic range)  5.7 - 6.4 %   HbA1c, POC (controlled diabetic range)  0.0 - 7.0 %    Assessment and Plan:   There are no diagnoses linked to this encounter.  . Reviewed expectations re: course of current medical issues. . Discussed self-management of symptoms. . Outlined signs and symptoms indicating need for more acute intervention. . Patient verbalized understanding and all questions were answered. Marland Kitchen Health Maintenance issues including appropriate healthy diet, exercise, and smoking avoidance were discussed with patient. . See orders for this visit as documented in the electronic medical record. . Patient received an After Visit Summary.  Briscoe Deutscher, DO [place], [place] 03/13/2018  Future Appointments  Date Time Provider [place]  03/13/2018  7:00 AM Briscoe Deutscher, DO [facility]    *** CMA served as scribe during this visit. History, Physical, and Plan performed by medical provider. The above documentation has been reviewed and is accurate and complete. Briscoe Deutscher, D.O.

## 2018-03-14 ENCOUNTER — Encounter: Payer: Self-pay | Admitting: Family Medicine

## 2018-03-14 ENCOUNTER — Ambulatory Visit (INDEPENDENT_AMBULATORY_CARE_PROVIDER_SITE_OTHER): Payer: 59 | Admitting: Family Medicine

## 2018-03-14 VITALS — BP 134/70 | HR 75 | Temp 98.6°F | Ht 64.0 in | Wt 253.0 lb

## 2018-03-14 DIAGNOSIS — F331 Major depressive disorder, recurrent, moderate: Secondary | ICD-10-CM

## 2018-03-14 DIAGNOSIS — L989 Disorder of the skin and subcutaneous tissue, unspecified: Secondary | ICD-10-CM

## 2018-03-14 DIAGNOSIS — F5101 Primary insomnia: Secondary | ICD-10-CM

## 2018-03-14 MED ORDER — TRAZODONE HCL 50 MG PO TABS: 25.0000 mg | ORAL_TABLET | Freq: Every evening | ORAL | 2 refills | Status: DC | PRN

## 2018-03-14 NOTE — Patient Instructions (Signed)
Take the trazodone every night for two weeks and go to bed at a time that will give you 7 hours of sleep.  Start the Zocor daily.  Call in two weeks and let us know how the trazodone is doing Stop the Adderall.

## 2018-03-14 NOTE — Progress Notes (Signed)
[name] is a 47 y.o. female is here for follow up.  History of Present Illness:   [name], CMA acting as scribe for Dr. Briscoe Deutscher.   HPI: Patient in office for medication follow up. She has been having some side effects with the adderall. She states that she feels like their is something in her throat. She does not have any reflux history. Has been taking over the counter treatments with no improvement. She has not been able to sleep more than a few hours at night. She has had some chest tightness at bed time. She does not feel like it is helping her focus at all. She admits that she is not able to concentrate at work or home. She has never been a good sleeper but she is not getting more than 1-2 hours at night when taking the medications. She has taken Ambien in the past and did not have good response. After reviewing all risk and benefits she has agreed to try trazodone. She will take everyday for two weeks and go to bed at a time that will give her 7 hours of sleep.   Since the last visit has the patient had any:  Appetite changes? No Unintentional weight loss? No Is medication working well ? No Does patient take drug holidays? Yes Difficulties falling to sleep or maintaining sleep? Yes Any anxiety?  Yes Any cardiac issues (fainting or paliptations)? No Suicidal thoughts? No Changes in health since last visit? Yes New medications? No Any illicit substance abuse? No Has the patient taken his medication today? Yes   She was prescribed Zocor but was not aware that she needed to start. Reviewed reasons for patient to start and she as agreed.   Bump on left shoulder was evaluated at last office visit. She has had more pain with it. She feels like has gotten bigger and feels a lot harder. She is not able to sleep on that side due to the pain. All options given to patient and she has decided to get referral dermatology.   Bariatric surgery referral: she did research  regarding looking into [place] she was a little apprehensive from some of the reviews she seen online. She would like to have more time to look into it and will let us know when she makes a decision. She declined referral to nutrition at this time.   She is working on making some changes in her life. After moving to new job she if feeling more confident in her personal life and not tied to her job. She is going to be traveling more with work and she will be going back to school soon.   Health Maintenance Due  Topic Date Due  . MAMMOGRAM  03/16/2018     Depression screen [facility] 2/9 02/05/2018 03/30/2017  Decreased Interest 3 3  Down, Depressed, Hopeless 3 3  PHQ - 2 Score 6 6  Altered sleeping 3 3  Tired, decreased energy 3 2  Change in appetite 3 3  Feeling bad or failure about yourself  2 3  Trouble concentrating 3 3  Moving slowly or fidgety/restless 2 0  Suicidal thoughts 0 1  PHQ-9 Score 22 21  Difficult doing work/chores Very difficult Somewhat difficult   PMHx, SurgHx, SocialHx, FamHx, Medications, and Allergies were reviewed in the Visit Navigator and updated as appropriate.   Patient Active Problem List   Diagnosis Date Noted  . Pure hypercholesterolemia 04/02/2017  . Insulin resistance 04/02/2017  .  Morbid obesity ([place]) 03/20/2017  . Hematuria 03/20/2017  . Migraine with aura and without status migrainosus, not intractable 03/20/2017  . Low vitamin D level 03/20/2017  . Iron deficiency anemia due to chronic blood loss 10/15/2015  . Moderate episode of recurrent major depressive disorder ([place]) 10/15/2015   Social History   Tobacco Use  . Smoking status: Never Smoker  . Smokeless tobacco: Never Used  Substance Use Topics  . Alcohol use: No  . Drug use: No   Current Medications and Allergies:   Current Outpatient Medications:  .  amphetamine-dextroamphetamine (ADDERALL) 20 MG tablet, Take 1 tablet (20 mg total) by mouth 2 (two) times daily., Disp: 60 tablet,  Rfl: 0 .  simvastatin (ZOCOR) 20 MG tablet, Take 1 tablet (20 mg total) by mouth at bedtime., Disp: 90 tablet, Rfl: 3   Allergies  Allergen Reactions  . Penicillins Other (See Comments)    Childhood rxn Has patient had a PCN reaction causing immediate rash, facial/tongue/throat swelling, SOB or lightheadedness with hypotension: No Has patient had a PCN reaction causing severe rash involving mucus membranes or skin necrosis: No Has patient had a PCN reaction that required hospitalization No Has patient had a PCN reaction occurring within the last 10 years: No If all of the above answers are "NO", then may proceed with Cephalosporin use.    Review of Systems   Pertinent items are noted in the HPI. Otherwise, ROS is negative.  Vitals:   Vitals:   03/14/18 0805  BP: 134/70  Pulse: 75  Temp: 98.6 F (37 C)  TempSrc: Oral  SpO2: 97%  Weight: 253 lb (114.8 kg)  Height: 5\' 4"  (1.626 m)     Body mass index is 43.43 kg/m.  Physical Exam:   Physical Exam  Constitutional: She appears well-nourished.  HENT:  Head: Normocephalic and atraumatic.  Eyes: Pupils are equal, round, and reactive to light. EOM are normal.  Neck: Normal range of motion. Neck supple.  Cardiovascular: Normal rate, regular rhythm, normal heart sounds and intact distal pulses.  Pulmonary/Chest: Effort normal.  Abdominal: Soft.  Skin: Skin is warm.  Psychiatric: She has a normal mood and affect. Her behavior is normal.  Nursing note and vitals reviewed.  Assessment and Plan:   Bonne was seen today for follow-up.  Diagnoses and all orders for this visit:  Skin abnormalities -     Ambulatory referral to Dermatology  Morbid obesity ([place])  Primary insomnia -     traZODone (DESYREL) 50 MG tablet; Take 0.5-1 tablets (25-50 mg total) by mouth at bedtime as needed for sleep.  Moderate episode of recurrent major depressive disorder ([place])   . Reviewed expectations re: course of current medical  issues. . Discussed self-management of symptoms. . Outlined signs and symptoms indicating need for more acute intervention. . Patient verbalized understanding and all questions were answered. Marland Kitchen Health Maintenance issues including appropriate healthy diet, exercise, and smoking avoidance were discussed with patient. . See orders for this visit as documented in the electronic medical record. . Patient received an After Visit Summary.  Briscoe Deutscher, DO [place], [place] 03/16/2018  Future Appointments  Date Time Provider [place]  06/18/2018  7:00 AM Briscoe Deutscher, DO [facility]   CMA served as scribe during this visit. History, Physical, and Plan performed by medical provider. The above documentation has been reviewed and is accurate and complete. Briscoe Deutscher, D.O.

## 2018-03-16 ENCOUNTER — Encounter: Payer: Self-pay | Admitting: Family Medicine

## 2018-03-19 ENCOUNTER — Encounter: Payer: Self-pay | Admitting: Family Medicine

## 2018-03-20 ENCOUNTER — Other Ambulatory Visit: Payer: Self-pay

## 2018-03-20 DIAGNOSIS — L989 Disorder of the skin and subcutaneous tissue, unspecified: Secondary | ICD-10-CM

## 2018-03-22 DIAGNOSIS — D2362 Other benign neoplasm of skin of left upper limb, including shoulder: Secondary | ICD-10-CM | POA: Diagnosis not present

## 2018-04-11 ENCOUNTER — Encounter: Payer: Self-pay | Admitting: Family Medicine

## 2018-04-19 DIAGNOSIS — D485 Neoplasm of uncertain behavior of skin: Secondary | ICD-10-CM | POA: Diagnosis not present

## 2018-04-19 DIAGNOSIS — D2361 Other benign neoplasm of skin of right upper limb, including shoulder: Secondary | ICD-10-CM | POA: Diagnosis not present

## 2018-05-14 DIAGNOSIS — N644 Mastodynia: Secondary | ICD-10-CM | POA: Diagnosis not present

## 2018-05-14 DIAGNOSIS — Z01419 Encounter for gynecological examination (general) (routine) without abnormal findings: Secondary | ICD-10-CM | POA: Diagnosis not present

## 2018-05-14 DIAGNOSIS — Z130 Encounter for screening for diseases of the blood and blood-forming organs and certain disorders involving the immune mechanism: Secondary | ICD-10-CM | POA: Diagnosis not present

## 2018-05-14 DIAGNOSIS — Z1389 Encounter for screening for other disorder: Secondary | ICD-10-CM | POA: Diagnosis not present

## 2018-05-15 ENCOUNTER — Other Ambulatory Visit: Payer: Self-pay | Admitting: Obstetrics and Gynecology

## 2018-05-15 DIAGNOSIS — N644 Mastodynia: Secondary | ICD-10-CM

## 2018-05-22 ENCOUNTER — Ambulatory Visit: Admission: RE | Admit: 2018-05-22 | Payer: 59 | Source: Ambulatory Visit

## 2018-05-22 ENCOUNTER — Ambulatory Visit
Admission: RE | Admit: 2018-05-22 | Discharge: 2018-05-22 | Disposition: A | Discharge status: Home / Self Care | Payer: 59 | Source: Ambulatory Visit | Attending: Obstetrics and Gynecology | Admitting: Obstetrics and Gynecology

## 2018-05-22 DIAGNOSIS — R928 Other abnormal and inconclusive findings on diagnostic imaging of breast: Secondary | ICD-10-CM | POA: Diagnosis not present

## 2018-05-22 DIAGNOSIS — N644 Mastodynia: Secondary | ICD-10-CM

## 2018-05-22 LAB — HM MAMMOGRAPHY

## 2018-05-25 DIAGNOSIS — Z01419 Encounter for gynecological examination (general) (routine) without abnormal findings: Secondary | ICD-10-CM | POA: Diagnosis not present

## 2018-06-14 ENCOUNTER — Encounter: Payer: Self-pay | Admitting: Family Medicine

## 2018-06-14 DIAGNOSIS — D2362 Other benign neoplasm of skin of left upper limb, including shoulder: Secondary | ICD-10-CM | POA: Insufficient documentation

## 2018-06-14 NOTE — Progress Notes (Signed)
ERROR - PATIENT NO SHOW

## 2018-06-18 ENCOUNTER — Telehealth: Payer: Self-pay | Admitting: Family Medicine

## 2018-06-18 ENCOUNTER — Encounter: Payer: 59 | Admitting: Family Medicine

## 2018-06-18 DIAGNOSIS — Z0289 Encounter for other administrative examinations: Secondary | ICD-10-CM

## 2018-06-18 NOTE — Telephone Encounter (Signed)
error 

## 2018-06-18 NOTE — Telephone Encounter (Signed)
-----   Message from Georgianne Fick sent at 06/18/2018  1:03 PM EST ----- Regarding: RE: PCP Changed Called pt to see if Juleen China was still PCP. Unable to leave vm. Vm mailbox full ----- Message ----- From: Janeece Fitting Sent: 06/18/2018   9:11 AM EST To: Stacy Gardner Small Subject: PCP Changed                                    Hailey, please contact patient to see if they transferred care to another provider. If so please remove Dr. Juleen China from the PCP team and let me know as well.  Thanks  ----- Message ----- From: Briscoe Deutscher, DO Sent: 06/18/2018   7:13 AM EST To: Janeece Fitting  Patient no-showed today. I think that she may have transferred to another doctor based on medication reconciliation. Please call to find out and take her off of my list if needed. Thanks!

## 2018-06-18 NOTE — Telephone Encounter (Signed)
See note

## 2018-06-19 NOTE — Telephone Encounter (Addendum)
Spoke to pt told her calling to see if she is still Dr. Alcario Drought pt due to No show for an appt. Pt said yes she is still Dr. Alcario Drought pt and she apologized for missing appt pt stated "she did not get a reminder". Told pt okay and asked would you like to reschedule? Pt said she will call back next year to schedule an appt January. Told pt okay if you need anything please contact office. Pt verbalized understanding.

## 2018-06-28 ENCOUNTER — Encounter: Payer: Self-pay | Admitting: Family Medicine

## 2018-08-09 DIAGNOSIS — Z719 Counseling, unspecified: Secondary | ICD-10-CM | POA: Diagnosis not present

## 2018-08-15 DIAGNOSIS — Z719 Counseling, unspecified: Secondary | ICD-10-CM | POA: Diagnosis not present

## 2018-08-16 ENCOUNTER — Encounter: Payer: Self-pay | Admitting: Family Medicine

## 2018-08-22 DIAGNOSIS — Z719 Counseling, unspecified: Secondary | ICD-10-CM | POA: Diagnosis not present

## 2018-08-25 ENCOUNTER — Encounter: Payer: Self-pay | Admitting: Family Medicine

## 2018-08-25 DIAGNOSIS — E8881 Metabolic syndrome: Secondary | ICD-10-CM

## 2018-08-25 DIAGNOSIS — E88819 Insulin resistance, unspecified: Secondary | ICD-10-CM

## 2018-08-29 DIAGNOSIS — Z719 Counseling, unspecified: Secondary | ICD-10-CM | POA: Diagnosis not present

## 2018-09-05 DIAGNOSIS — Z719 Counseling, unspecified: Secondary | ICD-10-CM | POA: Diagnosis not present

## 2019-06-25 ENCOUNTER — Encounter (INDEPENDENT_AMBULATORY_CARE_PROVIDER_SITE_OTHER): Payer: Self-pay | Admitting: Family Medicine

## 2019-06-25 ENCOUNTER — Ambulatory Visit (INDEPENDENT_AMBULATORY_CARE_PROVIDER_SITE_OTHER): Payer: 59 | Admitting: Family Medicine

## 2019-06-25 ENCOUNTER — Other Ambulatory Visit: Payer: Self-pay

## 2019-06-25 VITALS — BP 125/81 | HR 67 | Temp 98.1°F | Ht 65.0 in | Wt 242.0 lb

## 2019-06-25 DIAGNOSIS — Z0289 Encounter for other administrative examinations: Secondary | ICD-10-CM

## 2019-06-25 DIAGNOSIS — Z9189 Other specified personal risk factors, not elsewhere classified: Secondary | ICD-10-CM | POA: Diagnosis not present

## 2019-06-25 DIAGNOSIS — R5383 Other fatigue: Secondary | ICD-10-CM

## 2019-06-25 DIAGNOSIS — E7849 Other hyperlipidemia: Secondary | ICD-10-CM

## 2019-06-25 DIAGNOSIS — R739 Hyperglycemia, unspecified: Secondary | ICD-10-CM

## 2019-06-25 DIAGNOSIS — Z1331 Encounter for screening for depression: Secondary | ICD-10-CM | POA: Diagnosis not present

## 2019-06-25 DIAGNOSIS — Z6841 Body Mass Index (BMI) 40.0 and over, adult: Secondary | ICD-10-CM

## 2019-06-26 LAB — CBC WITH DIFFERENTIAL/PLATELET
Basophils Absolute: 0 10*3/uL (ref 0.0–0.2)
Basos: 1 %
EOS (ABSOLUTE): 0.1 10*3/uL (ref 0.0–0.4)
Eos: 1 %
Hematocrit: 38 % (ref 34.0–46.6)
Hemoglobin: 12.4 g/dL (ref 11.1–15.9)
Immature Grans (Abs): 0 10*3/uL (ref 0.0–0.1)
Immature Granulocytes: 0 %
Lymphocytes Absolute: 1.3 10*3/uL (ref 0.7–3.1)
Lymphs: 31 %
MCH: 29.2 pg (ref 26.6–33.0)
MCHC: 32.6 g/dL (ref 31.5–35.7)
MCV: 90 fL (ref 79–97)
Monocytes Absolute: 0.5 10*3/uL (ref 0.1–0.9)
Monocytes: 12 %
Neutrophils Absolute: 2.3 10*3/uL (ref 1.4–7.0)
Neutrophils: 55 %
Platelets: 347 10*3/uL (ref 150–450)
RBC: 4.24 x10E6/uL (ref 3.77–5.28)
RDW: 14.3 % (ref 11.7–15.4)
WBC: 4.2 10*3/uL (ref 3.4–10.8)

## 2019-06-26 LAB — COMPREHENSIVE METABOLIC PANEL
ALT: 11 IU/L (ref 0–32)
AST: 17 IU/L (ref 0–40)
Albumin/Globulin Ratio: 1.1 — ABNORMAL LOW (ref 1.2–2.2)
Albumin: 4.2 g/dL (ref 3.8–4.8)
Alkaline Phosphatase: 81 IU/L (ref 39–117)
BUN/Creatinine Ratio: 10 (ref 9–23)
BUN: 8 mg/dL (ref 6–24)
Bilirubin Total: <0.2 mg/dL (ref 0.0–1.2)
CO2: 26 mmol/L (ref 20–29)
Calcium: 9.1 mg/dL (ref 8.7–10.2)
Chloride: 98 mmol/L (ref 96–106)
Creatinine, Ser: 0.82 mg/dL (ref 0.57–1.00)
GFR calc Af Amer: 97 mL/min/{1.73_m2} (ref >59)
GFR calc non Af Amer: 84 mL/min/{1.73_m2} (ref >59)
Globulin, Total: 3.7 g/dL (ref 1.5–4.5)
Glucose: 89 mg/dL (ref 65–99)
Potassium: 4.7 mmol/L (ref 3.5–5.2)
Sodium: 137 mmol/L (ref 134–144)
Total Protein: 7.9 g/dL (ref 6.0–8.5)

## 2019-06-26 LAB — LIPID PANEL WITH LDL/HDL RATIO
Cholesterol, Total: 283 mg/dL — ABNORMAL HIGH (ref 100–199)
HDL: 40 mg/dL (ref >39)
LDL Chol Calc (NIH): 227 mg/dL — ABNORMAL HIGH (ref 0–99)
LDL/HDL Ratio: 5.7 ratio — ABNORMAL HIGH (ref 0.0–3.2)
Triglycerides: 92 mg/dL (ref 0–149)
VLDL Cholesterol Cal: 16 mg/dL (ref 5–40)

## 2019-06-26 LAB — TSH: TSH: 2.93 u[IU]/mL (ref 0.450–4.500)

## 2019-06-26 LAB — INSULIN, RANDOM: INSULIN: 7.6 u[IU]/mL (ref 2.6–24.9)

## 2019-06-26 LAB — HEMOGLOBIN A1C
Est. average glucose Bld gHb Est-mCnc: 117 mg/dL
Hgb A1c MFr Bld: 5.7 % — ABNORMAL HIGH (ref 4.8–5.6)

## 2019-06-26 LAB — T3: T3, Total: 114 ng/dL (ref 71–180)

## 2019-06-26 LAB — VITAMIN B12: Vitamin B-12: 1019 pg/mL (ref 232–1245)

## 2019-06-26 LAB — T4, FREE: Free T4: 1.11 ng/dL (ref 0.82–1.77)

## 2019-06-26 LAB — VITAMIN D 25 HYDROXY (VIT D DEFICIENCY, FRACTURES): Vit D, 25-Hydroxy: 17.5 ng/mL — ABNORMAL LOW (ref 30.0–100.0)

## 2019-06-26 LAB — FOLATE: Folate: 13.9 ng/mL (ref >3.0)

## 2019-06-26 NOTE — Progress Notes (Signed)
Office: [phone]  /  Fax: [phone]   HPI:   Chief Complaint: OBESITY  Brown Brown (MR# [id number]) is a 48 y.o. female who presents on 06/25/2019 for obesity evaluation and treatment. Current BMI is Body mass index is 40.27 kg/m. Brown Brown has struggled with obesity for years and has been unsuccessful in either losing weight or maintaining long term weight loss. Brown Brown states she is currently in the action stage of change and ready to dedicate time achieving and maintaining a healthier weight.   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 She often at times gets overwhelmed with the amount of information on dieting, and often follows trendy eating plans. She exercises almost excessively at times.  Brown Brown states her family eats meals together her desired weight loss is 87 lbs she started gaining weight in 2001 her heaviest weight ever was 265 lbs she is a picky eater and doesn't like to eat healthier foods  she skips meals frequently she is frequently drinking liquids with calories she struggles with emotional eating    Brown Brown feels her energy is lower than it should be. This has worsened with weight gain and has not worsened recently. Brown Brown admits to daytime somnolence and  admits to waking up still tired. Patient is at risk for obstructive sleep apnea. Patent has a history of symptoms of daytime Brown. Patient generally gets 4 hours of sleep per night, and states they generally have difficulty falling asleep. Snoring is present. Apneic episodes are not present. Epworth Sleepiness Score is 6.  Hyperglycemia Brown Brown has a history of some elevated blood glucose readings and A1c. She is not on metformin and denies a history of pre-diabetes. She denies polyphagia.  Hyperlipidemia Brown Brown has hyperlipidemia and she is not on statin. She is attempting to control her  cholesterol levels with intensive lifestyle modification including a low saturated fat diet, exercise and weight loss. She denies any chest pain, claudication or myalgias.  At risk for cardiovascular disease Brown Brown is at a higher than average risk for cardiovascular disease due to obesity and hyperlipidemia. She currently denies any chest pain.  Depression Screen Brown Brown's Food and Mood (modified PHQ-9) score was  Depression screen PHQ 2/9 06/25/2019  Decreased Interest 2  Down, Depressed, Hopeless 2  PHQ - 2 Score 4  Altered sleeping 1  Tired, decreased energy 2  Change in appetite 0  Feeling bad or failure about yourself  1  Trouble concentrating 1  Moving slowly or fidgety/restless 0  Suicidal thoughts 0  PHQ-9 Score 9  Difficult doing work/chores Not difficult at all    ASSESSMENT AND PLAN:  Brown Brown - Plan: B12, CBC w/Diff/Platelet, Folate, T3, T4, free, TSH, Vitamin D (25 hydroxy)  Hyperglycemia - Plan: Comprehensive Metabolic Panel (CMET), HgB A1c, Insulin, random  Eileen hyperlipidemia - Plan: Lipid Panel With LDL/HDL Ratio  Depression screening  At risk for heart disease  Class 3 severe obesity with serious comorbidity and body mass index (BMI) of 40.0 to 44.9 in adult, unspecified obesity type (HCC)  PLAN:  Brown Brown was informed that her Brown may be related to obesity, depression or many Eileen causes. Labs will be ordered, and in the meanwhile Brown Brown has agreed to work on diet, exercise and weight loss to help with Brown. Proper sleep hygiene was discussed including the need for 7-8 hours of quality sleep each night. A sleep study  was not ordered based on symptoms and Epworth score.  Hyperglycemia Fasting labs will be obtained today and results with be discussed with Brown Brown in 2 weeks at her follow up visit. In the meanwhile [name] was started on a lower simple carbohydrate diet prescription and will work on weight loss efforts.  Hyperlipidemia Brown Brown was  informed of the American Heart Association Guidelines emphasizing intensive lifestyle modifications as the first line treatment for hyperlipidemia. We discussed many lifestyle modifications today in depth. Brown Brown will start her diet prescription, and will continue to work on decreasing saturated fats such as fatty red meat, butter and many fried foods. She will also increase vegetables and lean protein in her diet and continue to work on exercise and weight loss efforts. We will cehck labs today.  Cardiovascular risk counseling Brown Brown was given extended (30 minutes) coronary artery disease prevention counseling today. She is 48 y.o. female and has risk factors for heart disease including obesity and hyperlipidemia. We discussed intensive lifestyle modifications today with an emphasis on specific weight loss instructions and strategies. Pt was also informed of the importance of increasing exercise and decreasing saturated fats to help prevent heart disease.  Depression Screen Brown Brown had a mildly positive depression screening. Depression is commonly associated with obesity and often results in emotional eating behaviors. We will monitor this closely and work on CBT to help improve the non-hunger eating patterns. Referral to Psychology may be required if no improvement is seen as she continues in our clinic.  Obesity [name] is currently in the action stage of change and her goal is to continue with weight loss efforts She has agreed to follow the Pescatarian eating plan Brown Brown has been instructed to work up to a goal of 150 minutes of combined cardio and strengthening exercise per week for weight loss and overall health benefits. We discussed the following Behavioral Modification Strategies today: increasing lean protein intake, decreasing simple carbohydrates  and work on meal planning and easy cooking plans  Brown Brown has agreed to follow up with our clinic in 2 weeks. She was informed of the importance of  frequent follow up visits to maximize her success with intensive lifestyle modifications for her multiple health conditions. She was informed we would discuss her lab results at her next visit unless there is a critical issue that needs to be addressed sooner. Brown Brown agreed to keep her next visit at the agreed upon time to discuss these results.  ALLERGIES: Allergies  Allergen Reactions  . Amoxicillin   . Penicillins Eileen (See Comments)    Childhood rxn Has patient had a PCN reaction causing immediate rash, facial/tongue/throat swelling, SOB or lightheadedness with hypotension: No Has patient had a PCN reaction causing severe rash involving mucus membranes or skin necrosis: No Has patient had a PCN reaction that required hospitalization No Has patient had a PCN reaction occurring within the last 10 years: No If all of the above answers are "NO", then may proceed with Cephalosporin use.     MEDICATIONS: Current Outpatient Medications on File Prior to Visit  Medication Sig Dispense Refill  . ASHWAGANDHA PO Take 1 capsule by mouth daily.    Marland Kitchen aspirin EC 81 MG tablet Take 81 mg by mouth daily.    Marland Kitchen b complex vitamins tablet Take 1 tablet by mouth daily.    Jolyne Loa Grape-Goldenseal (BERBERINE COMPLEX PO) Take 1 capsule by mouth daily.    Marland Kitchen BLACK ELDERBERRY,BERRY-FLOWER, PO Take 230 mLs by mouth daily.    . Calcium-Magnesium  333-167 MG TABS Take 1 tablet by mouth daily.    . Cyanocobalamin 1000 MCG CAPS Take 1 capsule by mouth daily.    . Multiple Vitamins-Minerals (AIRBORNE) TBEF Take 1 tablet by mouth every 3 (three) days.    Marland Kitchen zinc gluconate 50 MG tablet Take 50 mg by mouth daily.     No current facility-administered medications on file prior to visit.     PAST MEDICAL HISTORY: Past Medical History:  Diagnosis Date  . ADD (attention deficit disorder)   . Anxiety   . Back pain   . Blood pressure elevated without history of HTN   . Chest pain   . Constipation   .  Depression   . GERD (gastroesophageal reflux disease)   . HLD (hyperlipidemia)   . Iron deficiency anemia due to chronic blood loss 10/15/2015  . Migraine   . Morbid obesity ([place]) 03/20/2017  . Swallowing difficulty   . Uterine leiomyoma 07/06/2015  . Vitamin D deficiency     PAST SURGICAL HISTORY: Past Surgical History:  Procedure Laterality Date  . ABDOMINAL HYSTERECTOMY N/A 07/06/2015   Procedure: HYSTERECTOMY ABDOMINALwith removal of portion of left ovary;  Surgeon: Newton Pigg, MD;  Location: [facility];  Service: Gynecology;  Laterality: N/A;  Needs #1 Novafil Popoffs if Midline incision  . ANKLE FRACTURE SURGERY    . DILATION AND CURETTAGE OF UTERUS  2004  . FRACTURE SURGERY    . HAND SURGERY    . ORIF ANKLE FRACTURE Right 10/03/2013   Procedure: OPEN REDUCTION INTERNAL FIXATION (ORIF) ANKLE FRACTURE;  Surgeon: Newt Minion, MD;  Location: [facility];  Service: Orthopedics;  Laterality: Right;  Open Reduction Internal Fixation Right Ankle  . UTERINE FIBROID SURGERY  2002   Myomectomy     SOCIAL HISTORY: Social History   Tobacco Use  . Smoking status: Never Smoker  . Smokeless tobacco: Never Used  Substance Use Topics  . Alcohol use: No  . Drug use: No    FAMILY HISTORY: Family History  Problem Relation Age of Onset  . Breast cancer Maternal Aunt 30  . Breast cancer Cousin 98  . Cancer Mother   . Diabetes Mother   . Hypertension Mother   . High Cholesterol Mother   . Depression Mother   . Sleep apnea Mother   . Obesity Mother   . Diabetes Father   . Heart disease Father   . High Cholesterol Father   . Hypertension Father   . Hypertension Sister   . Heart attack Maternal Grandmother   . Diabetes Maternal Grandmother     ROS: Review of Systems  Constitutional: Positive for malaise/Brown. Negative for weight loss.       + Trouble sleeping  HENT: Positive for sinus pain.   Eyes:       + Wear glasses or contacts + Floaters  Cardiovascular: Negative for chest  pain and claudication.       + Leg cramping  Gastrointestinal: Positive for constipation, diarrhea and heartburn.  Musculoskeletal: Positive for back pain. Negative for myalgias.       + Muscle stiffness  Skin:       + Dryness  Neurological: Positive for headaches.  Endo/Heme/Allergies: Bruises/bleeds easily.       Negative polyphagia  Psychiatric/Behavioral: Positive for depression. Negative for suicidal ideas.       + Stress    PHYSICAL EXAM: Blood pressure 125/81, pulse 67, temperature 98.1 F (36.7 C), temperature source Oral, height 5\' 5"  (1.651 m),  weight 242 lb (109.8 kg), last menstrual period 06/16/2015, SpO2 100 %. Body mass index is 40.27 kg/m. Physical Exam Vitals signs reviewed.  Constitutional:      Appearance: Normal appearance. She is obese.  HENT:     Head: Normocephalic and atraumatic.     Nose: Nose normal.  Eyes:     General: No scleral icterus.    Extraocular Movements: Extraocular movements intact.  Neck:     Musculoskeletal: Normal range of motion and neck supple.     Comments: No thyromegaly present Cardiovascular:     Rate and Rhythm: Normal rate and regular rhythm.     Pulses: Normal pulses.     Heart sounds: Normal heart sounds.  Pulmonary:     Effort: Pulmonary effort is normal. No respiratory distress.     Breath sounds: Normal breath sounds.  Abdominal:     Palpations: Abdomen is soft.     Tenderness: There is no abdominal tenderness.     Comments: + Obesity  Musculoskeletal: Normal range of motion.     Right lower leg: No edema.     Left lower leg: No edema.  Skin:    General: Skin is warm and dry.  Neurological:     Mental Status: She is alert and oriented to person, place, and time.     Coordination: Coordination normal.  Psychiatric:        Mood and Affect: Mood normal.        Behavior: Behavior normal.     RECENT LABS AND TESTS: BMET    Component Value Date/Time   NA 137 06/25/2019 1057   K 4.7 06/25/2019 1057   CL 98  06/25/2019 1057   CO2 26 06/25/2019 1057   GLUCOSE 89 06/25/2019 1057   GLUCOSE 101 (H) 02/05/2018 0732   BUN 8 06/25/2019 1057   CREATININE 0.82 06/25/2019 1057   CALCIUM 9.1 06/25/2019 1057   GFRNONAA 84 06/25/2019 1057   GFRAA 97 06/25/2019 1057   Lab Results  Component Value Date   HGBA1C 5.7 (H) 06/25/2019   Lab Results  Component Value Date   INSULIN 7.6 06/25/2019   CBC    Component Value Date/Time   WBC 4.2 06/25/2019 1057   WBC 4.9 02/05/2018 0732   RBC 4.24 06/25/2019 1057   RBC 4.13 02/05/2018 0732   HGB 12.4 06/25/2019 1057   HCT 38.0 06/25/2019 1057   PLT 347 06/25/2019 1057   MCV 90 06/25/2019 1057   MCH 29.2 06/25/2019 1057   MCH 28.8 07/07/2015 0515   MCHC 32.6 06/25/2019 1057   MCHC 33.4 02/05/2018 0732   RDW 14.3 06/25/2019 1057   LYMPHSABS 1.3 06/25/2019 1057   MONOABS 0.5 02/05/2018 0732   EOSABS 0.1 06/25/2019 1057   BASOSABS 0.0 06/25/2019 1057   Iron/TIBC/Ferritin/ %Sat No results found for: IRON, TIBC, FERRITIN, IRONPCTSAT Lipid Panel     Component Value Date/Time   CHOL 283 (H) 06/25/2019 1057   TRIG 92 06/25/2019 1057   HDL 40 06/25/2019 1057   CHOLHDL 7 02/05/2018 0732   VLDL 25.2 02/05/2018 0732   LDLCALC 227 (H) 06/25/2019 1057   Hepatic Function Panel     Component Value Date/Time   PROT 7.9 06/25/2019 1057   ALBUMIN 4.2 06/25/2019 1057   AST 17 06/25/2019 1057   ALT 11 06/25/2019 1057   ALKPHOS 81 06/25/2019 1057   BILITOT <0.2 06/25/2019 1057   BILIDIR 0.1 11/09/2006 1233      Component Value Date/Time  TSH 2.930 06/25/2019 1057   Vitamin D No recent labs  ECG  shows NSR with a rate of (unable to obtain) INDIRECT CALORIMETER done today shows a VO2 of 299 and a REE of 2084. Her calculated basal metabolic rate is [id number] thus her basal metabolic rate is better than expected.       OBESITY BEHAVIORAL INTERVENTION VISIT  Today's visit was # 1   Starting weight: 242 lbs Starting date: 06/25/2019 Today's  weight : 242 lbs Today's date: 06/25/2019 Total lbs lost to date: 0    ASK: We discussed the diagnosis of obesity with Brown Brown today and Brown Brown agreed to give [place] permission to discuss obesity behavioral modification therapy today.  ASSESS: Samanthajo has the diagnosis of obesity and her BMI today is 40.27 Brown Brown is in the action stage of change   ADVISE: Brown Brown was educated on the multiple health risks of obesity as well as the benefit of weight loss to improve her health. She was advised of the need for long term treatment and the importance of lifestyle modifications to improve her current health and to decrease her risk of future health problems.  AGREE: Multiple dietary modification options and treatment options were discussed and  Brown Brown agreed to follow the recommendations documented in the above note.  ARRANGE: Brown Brown was educated on the importance of frequent visits to treat obesity as outlined per CMS and USPSTF guidelines and agreed to schedule her next follow up appointment today.   I, Trixie Dredge, am acting as transcriptionist for Dennard Nip, MD  I have reviewed the above documentation for accuracy and completeness, and I agree with the above. -Dennard Nip, MD
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
## 2019-07-09 ENCOUNTER — Ambulatory Visit (INDEPENDENT_AMBULATORY_CARE_PROVIDER_SITE_OTHER): Payer: 59 | Admitting: Family Medicine

## 2019-07-09 ENCOUNTER — Encounter (INDEPENDENT_AMBULATORY_CARE_PROVIDER_SITE_OTHER): Payer: Self-pay | Admitting: Family Medicine

## 2019-07-09 ENCOUNTER — Other Ambulatory Visit: Payer: Self-pay

## 2019-07-09 VITALS — BP 137/80 | HR 68 | Temp 98.9°F | Ht 65.0 in | Wt 243.0 lb

## 2019-07-09 DIAGNOSIS — Z9189 Other specified personal risk factors, not elsewhere classified: Secondary | ICD-10-CM

## 2019-07-09 DIAGNOSIS — Z6841 Body Mass Index (BMI) 40.0 and over, adult: Secondary | ICD-10-CM

## 2019-07-09 DIAGNOSIS — R7303 Prediabetes: Secondary | ICD-10-CM | POA: Diagnosis not present

## 2019-07-09 DIAGNOSIS — E559 Vitamin D deficiency, unspecified: Secondary | ICD-10-CM | POA: Diagnosis not present

## 2019-07-09 DIAGNOSIS — E7800 Pure hypercholesterolemia, unspecified: Secondary | ICD-10-CM

## 2019-07-09 MED ORDER — VITAMIN D (ERGOCALCIFEROL) 1.25 MG (50000 UNIT) PO CAPS: 50000.0000 [IU] | ORAL_CAPSULE | ORAL | 0 refills | Status: DC

## 2019-07-09 MED ORDER — METFORMIN HCL 500 MG PO TABS: 500.0000 mg | ORAL_TABLET | Freq: Every day | ORAL | 0 refills | Status: DC

## 2019-07-09 NOTE — Progress Notes (Signed)
Office: [phone]  /  Fax: [phone]   HPI:   Chief Complaint: OBESITY [name] is here to discuss her progress with her obesity treatment plan. She is on the Pescatarian eating plan and is following her eating plan approximately 50% of the time. She states she is running, strength training, step aerobics, and core (HIIT) 60-90 minutes 6 times per week. [name] struggled to follow her plan. She states she didn't care for much of the food and admits to a very low protein intake. Her weight is 243 lb (110.2 kg) today and has had a weight gain of 1 lb since her last visit. She has lost 0 lbs since starting treatment with [place].  Vitamin D deficiency [name] has a diagnosis of Vitamin D deficiency. Vitamin D results were discussed, level low at 17.5. She denies nausea, vomiting or muscle weakness but admits to fatigue.  Hyperlipidemia, Pure [name] has hyperlipidemia and is not on a statin. She tried Lipitor and felt it gave her full body aches and so she stopped it after 1 day and declines statins for now. She has been trying to improve her cholesterol levels with intensive lifestyle modification including a low saturated fat diet, exercise and weight loss. She denies any chest pain, claudication or myalgias.  Pre-Diabetes [name] has a diagnosis of prediabetes based on her elevated Hgb A1c (5.7 on 06/25/2019) and was informed this puts her at greater risk of developing diabetes. Labs were reviewed. She reports a positive family history of diabetes mellitus. She is not taking metformin currently and continues to work on diet and exercise to decrease risk of diabetes. She denies nausea or hypoglycemia.  At risk for diabetes [name] is at higher than average risk for developing diabetes due to her obesity. She currently denies polyuria or polydipsia.  ASSESSMENT AND PLAN:  Vitamin D deficiency - Plan: Vitamin D, Ergocalciferol, (DRISDOL) 1.25 MG (50000 UT) CAPS capsule  Pure hypercholesterolemia   Prediabetes - Plan: metFORMIN (GLUCOPHAGE) 500 MG tablet  At risk for diabetes mellitus  Class 3 severe obesity with serious comorbidity and body mass index (BMI) of 40.0 to 44.9 in adult, unspecified obesity type ([place])  PLAN:  Vitamin D Deficiency [name] was informed that low Vitamin D levels contributes to fatigue and are associated with obesity, breast, and colon cancer. She agrees to start taking prescription Vit D @ 50,000 IU every week #4 with 0 refills and will follow-up for routine testing of Vitamin D, at least 2-3 times per year. She was informed of the risk of over-replacement of Vitamin D and agrees to not increase her dose unless she discusses this with [place] first. [name] agrees to follow-up with our clinic in 2 weeks.  Hyperlipidemia, Pure [name] was informed of the American Heart Association Guidelines emphasizing intensive lifestyle modifications as the first line treatment for hyperlipidemia. We discussed many lifestyle modifications today in depth, and [name] will continue to work on decreasing saturated fats such as fatty red meat, butter and many fried foods. [name] will continue diet and recheck labs in 3 months. She will also increase vegetables and lean protein in her diet and continue to work on exercise and weight loss efforts.  Pre-Diabetes [name] will continue to work on weight loss, exercise, and decreasing simple carbohydrates in her diet to help decrease the risk of diabetes. We dicussed metformin including benefits and risks. She was informed that eating too many simple carbohydrates or too many calories at one sitting increases the likelihood of GI side effects.  [name] will start metformin 500 mg QAM with food and agrees to follow-up with our clinic in 2 weeks.  Diabetes risk counseling [name] was given extended (30 minutes) diabetes prevention counseling today. She is 48 y.o. female and has risk factors for diabetes including obesity. We discussed intensive lifestyle  modifications today with an emphasis on weight loss as well as increasing exercise and decreasing simple carbohydrates in her diet.  Obesity [name] is currently in the action stage of change. As such, her goal is to continue with weight loss efforts. She has agreed to keep a food journal with 1200-1500 calories and 80+ grams of protein daily. [name] has been instructed to work up to a goal of 150 minutes of combined cardio and strengthening exercise per week for weight loss and overall health benefits. We discussed the following Behavioral Modification Strategies today: increasing lean protein intake, decreasing simple carbohydrates, increasing vegetables, work on meal planning and easy cooking plans.  [name] has agreed to follow-up with our clinic in 2 weeks. She was informed of the importance of frequent follow-up visits to maximize her success with intensive lifestyle modifications for her multiple health conditions.  ALLERGIES: Allergies  Allergen Reactions  . Amoxicillin   . Penicillins Other (See Comments)    Childhood rxn Has patient had a PCN reaction causing immediate rash, facial/tongue/throat swelling, SOB or lightheadedness with hypotension: No Has patient had a PCN reaction causing severe rash involving mucus membranes or skin necrosis: No Has patient had a PCN reaction that required hospitalization No Has patient had a PCN reaction occurring within the last 10 years: No If all of the above answers are "NO", then may proceed with Cephalosporin use.     MEDICATIONS: Current Outpatient Medications on File Prior to Visit  Medication Sig Dispense Refill  . ASHWAGANDHA PO Take 1 capsule by mouth daily.    Marland Kitchen aspirin EC 81 MG tablet Take 81 mg by mouth daily.    Marland Kitchen b complex vitamins tablet Take 1 tablet by mouth daily.    Jolyne Loa Grape-Goldenseal (BERBERINE COMPLEX PO) Take 1 capsule by mouth daily.    Marland Kitchen BLACK ELDERBERRY,BERRY-FLOWER, PO Take 230 mLs by mouth daily.    .  Calcium-Magnesium 333-167 MG TABS Take 1 tablet by mouth daily.    . Cyanocobalamin 1000 MCG CAPS Take 1 capsule by mouth daily.    . Multiple Vitamins-Minerals (AIRBORNE) TBEF Take 1 tablet by mouth every 3 (three) days.    Marland Kitchen zinc gluconate 50 MG tablet Take 50 mg by mouth daily.     No current facility-administered medications on file prior to visit.     PAST MEDICAL HISTORY: Past Medical History:  Diagnosis Date  . ADD (attention deficit disorder)   . Anxiety   . Back pain   . Blood pressure elevated without history of HTN   . Chest pain   . Constipation   . Depression   . GERD (gastroesophageal reflux disease)   . HLD (hyperlipidemia)   . Iron deficiency anemia due to chronic blood loss 10/15/2015  . Migraine   . Morbid obesity ([place]) 03/20/2017  . Swallowing difficulty   . Uterine leiomyoma 07/06/2015  . Vitamin D deficiency     PAST SURGICAL HISTORY: Past Surgical History:  Procedure Laterality Date  . ABDOMINAL HYSTERECTOMY N/A 07/06/2015   Procedure: HYSTERECTOMY ABDOMINALwith removal of portion of left ovary;  Surgeon: Newton Pigg, MD;  Location: [facility];  Service: Gynecology;  Laterality: N/A;  Needs #1 Novafil Popoffs if  Midline incision  . ANKLE FRACTURE SURGERY    . DILATION AND CURETTAGE OF UTERUS  2004  . FRACTURE SURGERY    . HAND SURGERY    . ORIF ANKLE FRACTURE Right 10/03/2013   Procedure: OPEN REDUCTION INTERNAL FIXATION (ORIF) ANKLE FRACTURE;  Surgeon: Newt Minion, MD;  Location: [facility];  Service: Orthopedics;  Laterality: Right;  Open Reduction Internal Fixation Right Ankle  . UTERINE FIBROID SURGERY  2002   Myomectomy     SOCIAL HISTORY: Social History   Tobacco Use  . Smoking status: Never Smoker  . Smokeless tobacco: Never Used  Substance Use Topics  . Alcohol use: No  . Drug use: No    FAMILY HISTORY: Family History  Problem Relation Age of Onset  . Breast cancer Maternal Aunt 30  . Breast cancer Cousin 56  . Cancer Mother   .  Diabetes Mother   . Hypertension Mother   . High Cholesterol Mother   . Depression Mother   . Sleep apnea Mother   . Obesity Mother   . Diabetes Father   . Heart disease Father   . High Cholesterol Father   . Hypertension Father   . Hypertension Sister   . Heart attack Maternal Grandmother   . Diabetes Maternal Grandmother    ROS: Review of Systems  Cardiovascular: Negative for chest pain and claudication.  Gastrointestinal: Negative for nausea and vomiting.  Musculoskeletal: Negative for myalgias.       Negative for muscle weakness.  Endo/Heme/Allergies:       Negative for hypoglycemia.   PHYSICAL EXAM: Blood pressure 137/80, pulse 68, temperature 98.9 F (37.2 C), temperature source Oral, height 5\' 5"  (1.651 m), weight 243 lb (110.2 kg), last menstrual period 06/16/2015, SpO2 95 %. Body mass index is 40.44 kg/m. Physical Exam Vitals signs reviewed.  Constitutional:      Appearance: Normal appearance. She is obese.  Cardiovascular:     Rate and Rhythm: Normal rate.     Pulses: Normal pulses.  Pulmonary:     Effort: Pulmonary effort is normal.     Breath sounds: Normal breath sounds.  Musculoskeletal: Normal range of motion.  Skin:    General: Skin is warm and dry.  Neurological:     Mental Status: She is alert and oriented to person, place, and time.  Psychiatric:        Behavior: Behavior normal.   RECENT LABS AND TESTS: BMET    Component Value Date/Time   NA 137 06/25/2019 1057   K 4.7 06/25/2019 1057   CL 98 06/25/2019 1057   CO2 26 06/25/2019 1057   GLUCOSE 89 06/25/2019 1057   GLUCOSE 101 (H) 02/05/2018 0732   BUN 8 06/25/2019 1057   CREATININE 0.82 06/25/2019 1057   CALCIUM 9.1 06/25/2019 1057   GFRNONAA 84 06/25/2019 1057   GFRAA 97 06/25/2019 1057   Lab Results  Component Value Date   HGBA1C 5.7 (H) 06/25/2019   HGBA1C 5.7 (A) 02/05/2018   HGBA1C 6.1 11/06/2017   HGBA1C 6.3 03/20/2017   Lab Results  Component Value Date   INSULIN 7.6  06/25/2019   CBC    Component Value Date/Time   WBC 4.2 06/25/2019 1057   WBC 4.9 02/05/2018 0732   RBC 4.24 06/25/2019 1057   RBC 4.13 02/05/2018 0732   HGB 12.4 06/25/2019 1057   HCT 38.0 06/25/2019 1057   PLT 347 06/25/2019 1057   MCV 90 06/25/2019 1057   MCH 29.2 06/25/2019 1057  MCH 28.8 07/07/2015 0515   MCHC 32.6 06/25/2019 1057   MCHC 33.4 02/05/2018 0732   RDW 14.3 06/25/2019 1057   LYMPHSABS 1.3 06/25/2019 1057   MONOABS 0.5 02/05/2018 0732   EOSABS 0.1 06/25/2019 1057   BASOSABS 0.0 06/25/2019 1057   Iron/TIBC/Ferritin/ %Sat No results found for: IRON, TIBC, FERRITIN, IRONPCTSAT Lipid Panel     Component Value Date/Time   CHOL 283 (H) 06/25/2019 1057   TRIG 92 06/25/2019 1057   HDL 40 06/25/2019 1057   CHOLHDL 7 02/05/2018 0732   VLDL 25.2 02/05/2018 0732   LDLCALC 227 (H) 06/25/2019 1057   Hepatic Function Panel     Component Value Date/Time   PROT 7.9 06/25/2019 1057   ALBUMIN 4.2 06/25/2019 1057   AST 17 06/25/2019 1057   ALT 11 06/25/2019 1057   ALKPHOS 81 06/25/2019 1057   BILITOT <0.2 06/25/2019 1057   BILIDIR 0.1 11/09/2006 1233      Component Value Date/Time   TSH 2.930 06/25/2019 1057   TSH 3.80 03/20/2017 0845   TSH 2.05 11/09/2006 1233   Results for [name], [name] (MRN [id number]) as of 07/09/2019 16:28  Ref. Range 06/25/2019 10:57  Vitamin D, 25-Hydroxy Latest Ref Range: 30.0 - 100.0 ng/mL 17.5 (L)   OBESITY BEHAVIORAL INTERVENTION VISIT  Today's visit was #2  Starting weight: 242 lbs Starting date: 06/25/2019 Today's weight: 243 lbs  Today's date: 07/09/2019 Total lbs lost to date: 0     07/09/2019  Height 5\' 5"  (1.651 m)  Weight 243 lb (110.2 kg)  BMI (Calculated) 40.44  BLOOD PRESSURE - SYSTOLIC 0000000  BLOOD PRESSURE - DIASTOLIC 80   Body Fat % 123456 %  Total Body Water (lbs) 87.8 lbs   ASK: We discussed the diagnosis of obesity with [name] today and [name] agreed to give [place] permission to discuss obesity  behavioral modification therapy today.  ASSESS: [name] has the diagnosis of obesity and her BMI today is 40.4. [name] is in the action stage of change.   ADVISE: [name] was educated on the multiple health risks of obesity as well as the benefit of weight loss to improve her health. She was advised of the need for long term treatment and the importance of lifestyle modifications to improve her current health and to decrease her risk of future health problems.  AGREE: Multiple dietary modification options and treatment options were discussed and  [name] agreed to follow the recommendations documented in the above note.  ARRANGE: [name] was educated on the importance of frequent visits to treat obesity as outlined per CMS and USPSTF guidelines and agreed to schedule her next follow up appointment today.  I, Michaelene Song, am acting as Location manager for Dennard Nip, MD  I have reviewed the above documentation for accuracy and completeness, and I agree with the above. -Dennard Nip, MD

## 2019-07-15 ENCOUNTER — Other Ambulatory Visit: Payer: Self-pay

## 2019-07-15 ENCOUNTER — Ambulatory Visit (INDEPENDENT_AMBULATORY_CARE_PROVIDER_SITE_OTHER): Payer: 59 | Admitting: Psychology

## 2019-07-15 DIAGNOSIS — F3289 Other specified depressive episodes: Secondary | ICD-10-CM | POA: Diagnosis not present

## 2019-07-20 ENCOUNTER — Encounter (INDEPENDENT_AMBULATORY_CARE_PROVIDER_SITE_OTHER): Payer: Self-pay | Admitting: Family Medicine

## 2019-07-22 NOTE — Telephone Encounter (Signed)
Patient replied she is going to keep her appt on 12/17

## 2019-07-22 NOTE — Telephone Encounter (Signed)
Please advise 

## 2019-07-22 NOTE — Telephone Encounter (Signed)
I have not cancelled the 12/17 appt yet.  Please advise if/when to do that.  Thank you

## 2019-07-25 ENCOUNTER — Encounter (INDEPENDENT_AMBULATORY_CARE_PROVIDER_SITE_OTHER): Payer: Self-pay | Admitting: Family Medicine

## 2019-07-25 ENCOUNTER — Ambulatory Visit (INDEPENDENT_AMBULATORY_CARE_PROVIDER_SITE_OTHER): Payer: 59 | Admitting: Family Medicine

## 2019-07-25 ENCOUNTER — Other Ambulatory Visit: Payer: Self-pay

## 2019-07-25 VITALS — BP 127/79 | HR 73 | Temp 98.6°F | Ht 65.0 in | Wt 241.0 lb

## 2019-07-25 DIAGNOSIS — Z9189 Other specified personal risk factors, not elsewhere classified: Secondary | ICD-10-CM | POA: Diagnosis not present

## 2019-07-25 DIAGNOSIS — E559 Vitamin D deficiency, unspecified: Secondary | ICD-10-CM

## 2019-07-25 DIAGNOSIS — Z6841 Body Mass Index (BMI) 40.0 and over, adult: Secondary | ICD-10-CM

## 2019-07-25 DIAGNOSIS — K5909 Other constipation: Secondary | ICD-10-CM | POA: Diagnosis not present

## 2019-07-25 MED ORDER — VITAMIN D (ERGOCALCIFEROL) 1.25 MG (50000 UNIT) PO CAPS: 50000.0000 [IU] | ORAL_CAPSULE | ORAL | 0 refills | Status: DC

## 2019-07-29 NOTE — Progress Notes (Signed)
Office: [phone]  /  Fax: [phone]   HPI:  Chief Complaint: OBESITY [name] is here to discuss her progress with her obesity treatment plan. She is keeping a food journal with 1200-1500 calories and 80+ grams of protein and states she is following her eating plan approximately 100% of the time. She states she is exercising with weights/walking/running 60-90 minutes 4-5 times per week.  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 She is using protein powder to help her meet her goal.  Today's visit was #3 Starting weight: 242 lbs Starting date: 06/25/2019 Today's weight: 241 lbs  Today's date: 07/25/2019 Total lbs lost to date: 1  Total lbs lost since last in-office visit: 2  Constipation [name] notes decreased BM frequency. She felt this may be due to the metformin and stopped, but didn't notice any difference.  Vitamin D deficiency [name] has a diagnosis of Vitamin D deficiency. She is stable on prescription Vitamin D and requests a prescription. Last Vitamin D was not yet at goal, 17.5 on 06/25/2019.  At risk for osteopenia and osteoporosis [name] is at higher risk of osteopenia and osteoporosis due to Vitamin D deficiency.   ASSESSMENT AND PLAN:  Other constipation  Vitamin D deficiency - Plan: Vitamin D, Ergocalciferol, (DRISDOL) 1.25 MG (50000 UT) CAPS capsule  At risk for osteoporosis  Class 3 severe obesity with serious comorbidity and body mass index (BMI) of 40.0 to 44.9 in adult, unspecified obesity type ([place])  PLAN:  Constipation [name] will increase fiber to 25+ grams a day and increase her water intake to 80 ounces a day. Will follow.  Vitamin D Deficiency [name] was informed that low Vitamin D levels contributes to fatigue and are associated with obesity, breast, and colon cancer. She agrees to continue to take prescription Vit D @ 50,000 IU every week #4 with 0 refills and will follow-up for routine testing of Vitamin D,  at least 2-3 times per year. She was informed of the risk of over-replacement of Vitamin D and agrees to not increase her dose unless she discusses this with [place] first. [name] agrees to follow-up with our clinic in 6 weeks.  At risk for osteopenia and osteoporosis [name] was given extended  (15 minutes) osteoporosis prevention counseling today. [place] is at risk for osteopenia and osteoporosis due to her Vitamin D deficiency. She was encouraged to take her Vitamin D and follow her higher calcium diet and increase strengthening exercise to help strengthen her bones and decrease her risk of osteopenia and osteoporosis.  Obesity [name] is currently in the action stage of change. As such, her goal is to continue with weight loss efforts. She has agreed to keep a food journal with 1200-1500 calories and 80+ grams of protein and 25+ grams of fiber. [name] will work up to a goal of 150 minutes of combined cardio and strengthening exercise per week for weight loss and overall health benefits. We discussed the following Behavioral Modification Strategies today: increase H20 intake and increasing fiber rich foods.   [name] has agreed to follow-up with our clinic in 6 weeks. She was informed of the importance of frequent follow-up visits to maximize her success with intensive lifestyle modifications for her multiple health conditions.  ALLERGIES: Allergies  Allergen Reactions  . Amoxicillin   . Penicillins Other (See Comments)    Childhood rxn Has patient had a PCN reaction causing immediate rash, facial/tongue/throat swelling, SOB or lightheadedness with hypotension: No Has patient had  a PCN reaction causing severe rash involving mucus membranes or skin necrosis: No Has patient had a PCN reaction that required hospitalization No Has patient had a PCN reaction occurring within the last 10 years: No If all of the above answers are "NO", then may proceed with Cephalosporin use.     MEDICATIONS: Current  Outpatient Medications on File Prior to Visit  Medication Sig Dispense Refill  . ASHWAGANDHA PO Take 1 capsule by mouth daily.    Marland Kitchen aspirin EC 81 MG tablet Take 81 mg by mouth daily.    Marland Kitchen b complex vitamins tablet Take 1 tablet by mouth daily.    Jolyne Loa Grape-Goldenseal (BERBERINE COMPLEX PO) Take 1 capsule by mouth daily.    Marland Kitchen BLACK ELDERBERRY,BERRY-FLOWER, PO Take 230 mLs by mouth daily.    . Calcium-Magnesium 333-167 MG TABS Take 1 tablet by mouth daily.    . Cyanocobalamin 1000 MCG CAPS Take 1 capsule by mouth daily.    . Multiple Vitamins-Minerals (AIRBORNE) TBEF Take 1 tablet by mouth every 3 (three) days.    Marland Kitchen zinc gluconate 50 MG tablet Take 50 mg by mouth daily.     No current facility-administered medications on file prior to visit.    PAST MEDICAL HISTORY: Past Medical History:  Diagnosis Date  . ADD (attention deficit disorder)   . Anxiety   . Back pain   . Blood pressure elevated without history of HTN   . Chest pain   . Constipation   . Depression   . GERD (gastroesophageal reflux disease)   . HLD (hyperlipidemia)   . Iron deficiency anemia due to chronic blood loss 10/15/2015  . Migraine   . Morbid obesity ([place]) 03/20/2017  . Swallowing difficulty   . Uterine leiomyoma 07/06/2015  . Vitamin D deficiency     PAST SURGICAL HISTORY: Past Surgical History:  Procedure Laterality Date  . ABDOMINAL HYSTERECTOMY N/A 07/06/2015   Procedure: HYSTERECTOMY ABDOMINALwith removal of portion of left ovary;  Surgeon: Newton Pigg, MD;  Location: [facility];  Service: Gynecology;  Laterality: N/A;  Needs #1 Novafil Popoffs if Midline incision  . ANKLE FRACTURE SURGERY    . DILATION AND CURETTAGE OF UTERUS  2004  . FRACTURE SURGERY    . HAND SURGERY    . ORIF ANKLE FRACTURE Right 10/03/2013   Procedure: OPEN REDUCTION INTERNAL FIXATION (ORIF) ANKLE FRACTURE;  Surgeon: Newt Minion, MD;  Location: [facility];  Service: Orthopedics;  Laterality: Right;  Open Reduction Internal  Fixation Right Ankle  . UTERINE FIBROID SURGERY  2002   Myomectomy     SOCIAL HISTORY: Social History   Tobacco Use  . Smoking status: Never Smoker  . Smokeless tobacco: Never Used  Substance Use Topics  . Alcohol use: No  . Drug use: No    FAMILY HISTORY: Family History  Problem Relation Age of Onset  . Breast cancer Maternal Aunt 30  . Breast cancer Cousin 48  . Cancer Mother   . Diabetes Mother   . Hypertension Mother   . High Cholesterol Mother   . Depression Mother   . Sleep apnea Mother   . Obesity Mother   . Diabetes Father   . Heart disease Father   . High Cholesterol Father   . Hypertension Father   . Hypertension Sister   . Heart attack Maternal Grandmother   . Diabetes Maternal Grandmother    ROS: Review of Systems  Constitutional: Positive for weight loss.  Gastrointestinal: Positive for constipation.  PHYSICAL EXAM: Blood pressure 127/79, pulse 73, temperature 98.6 F (37 C), temperature source Oral, height 5\' 5"  (1.651 m), weight 241 lb (109.3 kg), last menstrual period 06/16/2015, SpO2 100 %. Body mass index is 40.1 kg/m. Physical Exam Vitals reviewed.  Constitutional:      Appearance: Normal appearance. She is obese.  Cardiovascular:     Rate and Rhythm: Normal rate.     Pulses: Normal pulses.  Pulmonary:     Effort: Pulmonary effort is normal.     Breath sounds: Normal breath sounds.  Musculoskeletal:        General: Normal range of motion.  Skin:    General: Skin is warm and dry.  Neurological:     Mental Status: She is alert and oriented to person, place, and time.  Psychiatric:        Behavior: Behavior normal.   RECENT LABS AND TESTS: BMET    Component Value Date/Time   NA 137 06/25/2019 1057   K 4.7 06/25/2019 1057   CL 98 06/25/2019 1057   CO2 26 06/25/2019 1057   GLUCOSE 89 06/25/2019 1057   GLUCOSE 101 (H) 02/05/2018 0732   BUN 8 06/25/2019 1057   CREATININE 0.82 06/25/2019 1057   CALCIUM 9.1 06/25/2019 1057    GFRNONAA 84 06/25/2019 1057   GFRAA 97 06/25/2019 1057   Lab Results  Component Value Date   HGBA1C 5.7 (H) 06/25/2019   HGBA1C 5.7 (A) 02/05/2018   HGBA1C 6.1 11/06/2017   HGBA1C 6.3 03/20/2017   Lab Results  Component Value Date   INSULIN 7.6 06/25/2019   CBC    Component Value Date/Time   WBC 4.2 06/25/2019 1057   WBC 4.9 02/05/2018 0732   RBC 4.24 06/25/2019 1057   RBC 4.13 02/05/2018 0732   HGB 12.4 06/25/2019 1057   HCT 38.0 06/25/2019 1057   PLT 347 06/25/2019 1057   MCV 90 06/25/2019 1057   MCH 29.2 06/25/2019 1057   MCH 28.8 07/07/2015 0515   MCHC 32.6 06/25/2019 1057   MCHC 33.4 02/05/2018 0732   RDW 14.3 06/25/2019 1057   LYMPHSABS 1.3 06/25/2019 1057   MONOABS 0.5 02/05/2018 0732   EOSABS 0.1 06/25/2019 1057   BASOSABS 0.0 06/25/2019 1057   Iron/TIBC/Ferritin/ %Sat No results found for: IRON, TIBC, FERRITIN, IRONPCTSAT Lipid Panel     Component Value Date/Time   CHOL 283 (H) 06/25/2019 1057   TRIG 92 06/25/2019 1057   HDL 40 06/25/2019 1057   CHOLHDL 7 02/05/2018 0732   VLDL 25.2 02/05/2018 0732   LDLCALC 227 (H) 06/25/2019 1057   Hepatic Function Panel     Component Value Date/Time   PROT 7.9 06/25/2019 1057   ALBUMIN 4.2 06/25/2019 1057   AST 17 06/25/2019 1057   ALT 11 06/25/2019 1057   ALKPHOS 81 06/25/2019 1057   BILITOT <0.2 06/25/2019 1057   BILIDIR 0.1 11/09/2006 1233      Component Value Date/Time   TSH 2.930 06/25/2019 1057   TSH 3.80 03/20/2017 0845   TSH 2.05 11/09/2006 1233      I, Michaelene Song, am acting as Location manager for Dennard Nip, MD I have reviewed the above documentation for accuracy and completeness, and I agree with the above. -Dennard Nip, MD

## 2019-08-13 ENCOUNTER — Ambulatory Visit (INDEPENDENT_AMBULATORY_CARE_PROVIDER_SITE_OTHER): Payer: 59 | Admitting: Psychology

## 2019-08-19 NOTE — Progress Notes (Signed)
Office: [phone]  /  Fax: [phone]    Date: September 02, 2019   Appointment Start Time: 7:59am Duration: 32 minutes Provider: Glennie Isle, Psy.D. Type of Session: Individual Therapy  Location of Patient: Home Location of Provider: [facility] Type of Contact: Telepsychological Visit via [employer]  Session Content: [name] is a 49 y.o. female presenting via [place] for a follow-up appointment to address the previously established treatment goal of increasing coping skills. Today's appointment was a telepsychological visit due to COVID-19. [name] provided verbal consent for today's telepsychological appointment and she is aware she is responsible for securing confidentiality on her end of the session. Prior to proceeding with today's appointment, [name]'s physical location at the time of this appointment was obtained as well a phone number she could be reached at in the event of technical difficulties. [name] and this provider participated in today's telepsychological service.   This provider conducted a brief check-in and verbally administered the PHQ-9 and GAD-7. [name] shared, "The program isn't successful for me so far." This was explored further. She discussed following the structured meal plan; however, she does not feel the "results" are what she expected. [name]'s frustration and disappointment was processed. [name] shared she engages in self-talk to help reduce feelings of frustration. Positive reinforcement was provided. She was encouraged to address the aforementioned further with Dr. Leafy Ro during their appointment on September 04, 2019; she agreed. Remainder of session focused on [name] sharing about her typical eating habits. It was reflected [name] may not be consuming enough protein on a daily basis. Thus, she was encouraged to increase protein intake and eat smaller portions throughout the day if needed. She also acknowledged she does not consume enough  calories; therefore, she was encouraged to stay within the recommended calorie range. She agreed. Overall, [name] was receptive to today's appointment as evidenced by openness to sharing and responsiveness to feedback.  Mental Status Examination:  Appearance: well groomed and appropriate hygiene  Behavior: appropriate to circumstances Mood: euthymic Affect: mood congruent Speech: normal in rate, volume, and tone Eye Contact: intermittent  Psychomotor Activity: appropriate Gait: unable to assess Thought Process: linear, logical, and goal directed  Thought Content/Perception: denies suicidal and homicidal ideation, plan, and intent and no hallucinations, delusions, bizarre thinking or behavior reported or observed Orientation: time, person, place and purpose of appointment Memory/Concentration: memory, attention, language, and fund of knowledge intact  Insight/Judgment: good  Structured Assessments Results: The Patient Health Questionnaire-9 (PHQ-9) is a self-report measure that assesses symptoms and severity of depression over the course of the last two weeks. [name] obtained a score of 8 suggesting mild depression. [name] finds the endorsed symptoms to be not difficult at all. [0= Not at all; 1= Several days; 2= More than half the days; 3= Nearly every day] Little interest or pleasure in doing things 0  Feeling down, depressed, or hopeless-  related to weight loss 1  Trouble falling or staying asleep, or sleeping too much- averages 4 hours 3  Feeling tired or having little energy 0  Poor appetite or overeating 3  Feeling bad about yourself --- or that you are a failure or have let yourself or your family down 0  Trouble concentrating on things, such as reading the newspaper or watching television 1  Moving or speaking so slowly that other people could have noticed? Or the opposite --- being so fidgety or restless that you have been moving around a lot more than usual 0  Thoughts that  you  would be better off dead or hurting yourself in some way 0  PHQ-9 Score 8    The Generalized Anxiety Disorder-7 (GAD-7) is a brief self-report measure that assesses symptoms of anxiety over the course of the last two weeks. [name] obtained a score of 2 suggesting minimal anxiety. [name] finds the endorsed symptoms to be not difficult at all. [0= Not at all; 1= Several days; 2= Over half the days; 3= Nearly every day] Feeling nervous, anxious, on edge 0  Not being able to stop or control worrying 0  Worrying too much about different things 0  Trouble relaxing 2  Being so restless that it's hard to sit still 0  Becoming easily annoyed or irritable 0  Feeling afraid as if something awful might happen 0  GAD-7 Score 2   Interventions:  Conducted a brief chart review Verbally administered PHQ-9 and GAD-7 for symptom monitoring Provided empathic reflections and validation Provided positive reinforcement Employed supportive psychotherapy interventions to facilitate reduced distress and to improve coping skills with identified stressors Employed motivational interviewing skills to assess patient's willingness/desire to adhere to recommended medical treatments and assignments  DSM-5 Diagnosis: 311 (F32.8) Other Specified Depressive Disorder, Emotional Eating Behaviors  Treatment Goal & Progress: During the initial appointment with this provider, the following treatment goal was established: increase coping skills. Progress is limited, as [name] has just begun treatment with this provider; however, she is receptive to the interaction and interventions and rapport is being established.   Plan: The next appointment will be scheduled in two weeks, which will be via [employer]. The next session will focus on working towards the established treatment goal.

## 2019-09-02 ENCOUNTER — Other Ambulatory Visit: Payer: Self-pay

## 2019-09-02 ENCOUNTER — Ambulatory Visit (INDEPENDENT_AMBULATORY_CARE_PROVIDER_SITE_OTHER): Payer: 59 | Admitting: Psychology

## 2019-09-02 DIAGNOSIS — F3289 Other specified depressive episodes: Secondary | ICD-10-CM

## 2019-09-02 NOTE — Progress Notes (Unsigned)
Office: [phone]  /  Fax: [phone]    Date: September 16, 2019   Appointment Start Time: *** Duration: *** minutes Provider: Glennie Isle, Psy.D. Type of Session: Individual Therapy  Location of Patient: {gbptloc:23249} Location of Provider: {Location of Service:22491} Type of Contact: Telepsychological Visit via {gbtelepsych:23399}  Session Content: [name] is a 49 y.o. female presenting via {gbtelepsych:23399} for a follow-up appointment to address the previously established treatment goal of increasing coping skills. Today's appointment was a telepsychological visit due to COVID-19. [name] provided verbal consent for today's telepsychological appointment and she is aware she is responsible for securing confidentiality on her end of the session. Prior to proceeding with today's appointment, [name]'s physical location at the time of this appointment was obtained as well a phone number she could be reached at in the event of technical difficulties. [name] and this provider participated in today's telepsychological service.   This provider conducted a brief check-in and verbally administered the PHQ-9 and GAD-7. *** [name] was receptive to today's appointment as evidenced by openness to sharing, responsiveness to feedback, and {gbreceptiveness:23401}.  Mental Status Examination:  Appearance: {Appearance:22431} Behavior: {Behavior:22445} Mood: {gbmood:21757} Affect: {Affect:22436} Speech: {Speech:22432} Eye Contact: {Eye Contact:22433} Psychomotor Activity: {Motor Activity:22434} Gait: {gbgait:23404} Thought Process: {thought process:22448}  Thought Content/Perception: {disturbances:22451} Orientation: {Orientation:22437} Memory/Concentration: {gbcognition:22449} Insight/Judgment: {Insight:22446}  Structured Assessments Results: The Patient Health Questionnaire-9 (PHQ-9) is a self-report measure that assesses symptoms and severity of depression over the course of the last two weeks.  [name] obtained a score of *** suggesting {GBPHQ9SEVERITY:21752}. [name] finds the endorsed symptoms to be {gbphq9difficulty:21754}. [0= Not at all; 1= Several days; 2= More than half the days; 3= Nearly every day] Little interest or pleasure in doing things ***  Feeling down, depressed, or hopeless ***  Trouble falling or staying asleep, or sleeping too much ***  Feeling tired or having little energy ***  Poor appetite or overeating ***  Feeling bad about yourself --- or that you are a failure or have let yourself or your family down ***  Trouble concentrating on things, such as reading the newspaper or watching television ***  Moving or speaking so slowly that other people could have noticed? Or the opposite --- being so fidgety or restless that you have been moving around a lot more than usual ***  Thoughts that you would be better off dead or hurting yourself in some way ***  PHQ-9 Score ***    The Generalized Anxiety Disorder-7 (GAD-7) is a brief self-report measure that assesses symptoms of anxiety over the course of the last two weeks. [name] obtained a score of *** suggesting {gbgad7severity:21753}. [name] finds the endorsed symptoms to be {gbphq9difficulty:21754}. [0= Not at all; 1= Several days; 2= Over half the days; 3= Nearly every day] Feeling nervous, anxious, on edge ***  Not being able to stop or control worrying ***  Worrying too much about different things ***  Trouble relaxing ***  Being so restless that it's hard to sit still ***  Becoming easily annoyed or irritable ***  Feeling afraid as if something awful might happen ***  GAD-7 Score ***   Interventions:  {Interventions for Progress Notes:23405}  DSM-5 Diagnosis: 311 (F32.8) Other Specified Depressive Disorder, Emotional Eating Behaviors  Treatment Goal & Progress: During the initial appointment with this provider, the following treatment goal was established: increase coping skills. [name] has demonstrated progress in  her goal as evidenced by {gbtxprogress:22839}. [name] also {gbtxprogress2:22951}.  Plan: The next appointment will be scheduled in {gbweeks:21758}, which will  be {gbtxmodality:23402}. The next session will focus on {Plan for Next Appointment:23400}.

## 2019-09-04 ENCOUNTER — Other Ambulatory Visit: Payer: Self-pay

## 2019-09-04 ENCOUNTER — Encounter (INDEPENDENT_AMBULATORY_CARE_PROVIDER_SITE_OTHER): Payer: Self-pay | Admitting: Family Medicine

## 2019-09-04 ENCOUNTER — Ambulatory Visit (INDEPENDENT_AMBULATORY_CARE_PROVIDER_SITE_OTHER): Payer: 59 | Admitting: Family Medicine

## 2019-09-04 VITALS — BP 120/76 | HR 78 | Temp 98.6°F | Ht 65.0 in | Wt 238.0 lb

## 2019-09-04 DIAGNOSIS — E559 Vitamin D deficiency, unspecified: Secondary | ICD-10-CM | POA: Diagnosis not present

## 2019-09-04 DIAGNOSIS — E7849 Other hyperlipidemia: Secondary | ICD-10-CM

## 2019-09-04 DIAGNOSIS — R7303 Prediabetes: Secondary | ICD-10-CM

## 2019-09-04 DIAGNOSIS — Z9189 Other specified personal risk factors, not elsewhere classified: Secondary | ICD-10-CM

## 2019-09-04 DIAGNOSIS — Z6839 Body mass index (BMI) 39.0-39.9, adult: Secondary | ICD-10-CM

## 2019-09-04 MED ORDER — VITAMIN D (ERGOCALCIFEROL) 1.25 MG (50000 UNIT) PO CAPS: 50000.0000 [IU] | ORAL_CAPSULE | ORAL | 0 refills | Status: DC

## 2019-09-04 NOTE — Progress Notes (Signed)
Chief Complaint:   OBESITY [name] is here to discuss her progress with her obesity treatment plan along with follow-up of her obesity related diagnoses. [name] is on keeping a food journal and adhering to recommended goals of 1200-1500 calories and 80+ grams of protein daily and states she is following her eating plan approximately 100% of the time. [name] states she is on the treadmill and doing strength training for 60 minutes 4 times per week.  Today's visit was #: 4 Starting weight: 242 lbs Starting date: 06/25/2019 Today's weight: 238 lbs Today's date: 09/04/2019 Total lbs lost to date: 4 Total lbs lost since last in-office visit: 3  Interim History: [name] continues to do well with weight loss. She struggles to eat enough calories and protein, and although she is doing well she is frustrated that her weight loss isn't faster.  Subjective:   1. Vitamin D deficiency [name]'s last Vit D level was low. She is due for labs and denies muscle weakness.  2. Pre-diabetes [name] is working on diet and she is due for labs.  3. Other hyperlipidemia [name] is attempting to diet control. She denies chest pains, and she is due for labs.  4. At risk for heart disease [name] is at a higher than average risk for cardiovascular disease due to obesity. Reviewed: no chest pain on exertion, no dyspnea on exertion, and no swelling of ankles.  Assessment/Plan:   1. Vitamin D deficiency Low Vitamin D level contributes to fatigue and are associated with obesity, breast, and colon cancer. We will recheck labs today and we will refill prescription Vitamin D for 1 month. Esmirna will follow-up for routine testing of Vitamin D, at least 2-3 times per year to avoid over-replacement. We will continue to monitor.  - VITAMIN D 25 Hydroxy (Vit-D Deficiency, Fractures) - Vitamin D, Ergocalciferol, (DRISDOL) 1.25 MG (50000 UNIT) CAPS capsule; Take 1 capsule (50,000 Units total) by mouth every 7 (seven) days.   Dispense: 4 capsule; Refill: 0  2. Pre-diabetes Malin will continue to work on weight loss, diet, exercise, and decreasing simple carbohydrates to help decrease the risk of diabetes. We will check labs today, and will follow up.  - Hemoglobin A1c - Insulin, random  3. Other hyperlipidemia Cardiovascular risk and specific lipid/LDL goals reviewed. We discussed several lifestyle modifications today and Tyeesha will continue to work on diet, exercise and weight loss efforts. We will check labs today. Orders and follow up as documented in patient record.   Counseling Intensive lifestyle modifications are the first line treatment for this issue. . Dietary changes: Increase soluble fiber. Decrease simple carbohydrates. . Exercise changes: Moderate to vigorous-intensity aerobic activity 150 minutes per week if tolerated. . Lipid-lowering medications: see documented in medical record.  - Comprehensive metabolic panel - Lipid Panel With LDL/HDL Ratio  4. At risk for heart disease [name] was given approximately 15 minutes of coronary artery disease prevention counseling today. She is 49 y.o. female and has risk factors for heart disease including obesity. We discussed intensive lifestyle modifications today with an emphasis on specific weight loss instructions and strategies.   Repetitive spaced learning was employed today to elicit superior memory formation and behavioral change.  5. Class 2 severe obesity with serious comorbidity and body mass index (BMI) of 39.0 to 39.9 in adult, unspecified obesity type [facility]) [name] is currently in the action stage of change. As such, her goal is to continue with weight loss efforts. She has agreed to keeping a food  journal and adhering to recommended goals of 1200-1500 calories and 80+ grams of protein daily.   Exercise goals: [name] is to continue her current exercise regimen as is.  Behavioral modification strategies: decreasing simple carbohydrates and no  skipping meals.  [name] has agreed to follow-up with our clinic in 2 to 3 weeks. She was informed of the importance of frequent follow-up visits to maximize her success with intensive lifestyle modifications for her multiple health conditions.   [name] was informed we would discuss her lab results at her next visit unless there is a critical issue that needs to be addressed sooner. [name] agreed to keep her next visit at the agreed upon time to discuss these results.  Objective:   Blood pressure 120/76, pulse 78, temperature 98.6 F (37 C), temperature source Oral, height 5\' 5"  (1.651 m), weight 238 lb (108 kg), last menstrual period 06/16/2015, SpO2 98 %. Body mass index is 39.61 kg/m.  General: Cooperative, alert, well developed, in no acute distress. HEENT: Conjunctivae and lids unremarkable. Cardiovascular: Regular rhythm.  Lungs: Normal work of breathing. Neurologic: No focal deficits.   Lab Results  Component Value Date   CREATININE 0.82 06/25/2019   BUN 8 06/25/2019   NA 137 06/25/2019   K 4.7 06/25/2019   CL 98 06/25/2019   CO2 26 06/25/2019   Lab Results  Component Value Date   ALT 11 06/25/2019   AST 17 06/25/2019   ALKPHOS 81 06/25/2019   BILITOT <0.2 06/25/2019   Lab Results  Component Value Date   HGBA1C 5.7 (H) 06/25/2019   HGBA1C 5.7 (A) 02/05/2018   HGBA1C 6.1 11/06/2017   HGBA1C 6.3 03/20/2017   Lab Results  Component Value Date   INSULIN 7.6 06/25/2019   Lab Results  Component Value Date   TSH 2.930 06/25/2019   Lab Results  Component Value Date   CHOL 283 (H) 06/25/2019   HDL 40 06/25/2019   LDLCALC 227 (H) 06/25/2019   TRIG 92 06/25/2019   CHOLHDL 7 02/05/2018   Lab Results  Component Value Date   WBC 4.2 06/25/2019   HGB 12.4 06/25/2019   HCT 38.0 06/25/2019   MCV 90 06/25/2019   PLT 347 06/25/2019   No results found for: IRON, TIBC, FERRITIN  Attestation Statements:   Reviewed by clinician on day of visit: allergies,  medications, problem list, medical history, surgical history, family history, social history, and previous encounter notes.   I, Trixie Dredge, am acting as transcriptionist for Dennard Nip, MD.  I have reviewed the above documentation for accuracy and completeness, and I agree with the above. -  Dennard Nip, MD

## 2019-09-05 ENCOUNTER — Encounter (INDEPENDENT_AMBULATORY_CARE_PROVIDER_SITE_OTHER): Payer: Self-pay | Admitting: Family Medicine

## 2019-09-05 LAB — COMPREHENSIVE METABOLIC PANEL
ALT: 15 IU/L (ref 0–32)
AST: 17 IU/L (ref 0–40)
Albumin/Globulin Ratio: 1 — ABNORMAL LOW (ref 1.2–2.2)
Albumin: 3.8 g/dL (ref 3.8–4.8)
Alkaline Phosphatase: 82 IU/L (ref 39–117)
BUN/Creatinine Ratio: 12 (ref 9–23)
BUN: 11 mg/dL (ref 6–24)
Bilirubin Total: 0.2 mg/dL (ref 0.0–1.2)
CO2: 30 mmol/L — ABNORMAL HIGH (ref 20–29)
Calcium: 9.6 mg/dL (ref 8.7–10.2)
Chloride: 102 mmol/L (ref 96–106)
Creatinine, Ser: 0.94 mg/dL (ref 0.57–1.00)
GFR calc Af Amer: 82 mL/min/{1.73_m2} (ref >59)
GFR calc non Af Amer: 71 mL/min/{1.73_m2} (ref >59)
Globulin, Total: 3.9 g/dL (ref 1.5–4.5)
Glucose: 91 mg/dL (ref 65–99)
Potassium: 4.8 mmol/L (ref 3.5–5.2)
Sodium: 143 mmol/L (ref 134–144)
Total Protein: 7.7 g/dL (ref 6.0–8.5)

## 2019-09-05 LAB — LIPID PANEL WITH LDL/HDL RATIO
Cholesterol, Total: 312 mg/dL — ABNORMAL HIGH (ref 100–199)
HDL: 45 mg/dL (ref >39)
LDL Chol Calc (NIH): 245 mg/dL — ABNORMAL HIGH (ref 0–99)
LDL/HDL Ratio: 5.4 ratio — ABNORMAL HIGH (ref 0.0–3.2)
Triglycerides: 120 mg/dL (ref 0–149)
VLDL Cholesterol Cal: 22 mg/dL (ref 5–40)

## 2019-09-05 LAB — INSULIN, RANDOM: INSULIN: 20.3 u[IU]/mL (ref 2.6–24.9)

## 2019-09-05 LAB — HEMOGLOBIN A1C
Est. average glucose Bld gHb Est-mCnc: 131 mg/dL
Hgb A1c MFr Bld: 6.2 % — ABNORMAL HIGH (ref 4.8–5.6)

## 2019-09-05 LAB — VITAMIN D 25 HYDROXY (VIT D DEFICIENCY, FRACTURES): Vit D, 25-Hydroxy: 28.9 ng/mL — ABNORMAL LOW (ref 30.0–100.0)

## 2019-09-05 NOTE — Telephone Encounter (Signed)
Please advise 

## 2019-09-09 NOTE — Telephone Encounter (Signed)
fyi

## 2019-09-16 ENCOUNTER — Ambulatory Visit (INDEPENDENT_AMBULATORY_CARE_PROVIDER_SITE_OTHER): Payer: 59 | Admitting: Psychology

## 2019-10-02 ENCOUNTER — Encounter (INDEPENDENT_AMBULATORY_CARE_PROVIDER_SITE_OTHER): Payer: Self-pay | Admitting: Family Medicine

## 2019-10-02 ENCOUNTER — Ambulatory Visit (INDEPENDENT_AMBULATORY_CARE_PROVIDER_SITE_OTHER): Payer: 59 | Admitting: Family Medicine

## 2019-10-02 ENCOUNTER — Other Ambulatory Visit: Payer: Self-pay

## 2019-10-02 VITALS — BP 129/81 | HR 87 | Temp 98.1°F | Ht 65.0 in | Wt 244.0 lb

## 2019-10-02 DIAGNOSIS — E7849 Other hyperlipidemia: Secondary | ICD-10-CM

## 2019-10-02 DIAGNOSIS — G43909 Migraine, unspecified, not intractable, without status migrainosus: Secondary | ICD-10-CM | POA: Diagnosis not present

## 2019-10-02 DIAGNOSIS — E559 Vitamin D deficiency, unspecified: Secondary | ICD-10-CM | POA: Diagnosis not present

## 2019-10-02 DIAGNOSIS — Z6841 Body Mass Index (BMI) 40.0 and over, adult: Secondary | ICD-10-CM

## 2019-10-02 DIAGNOSIS — R7303 Prediabetes: Secondary | ICD-10-CM

## 2019-10-02 DIAGNOSIS — F3289 Other specified depressive episodes: Secondary | ICD-10-CM

## 2019-10-02 NOTE — Progress Notes (Signed)
Chief Complaint:   OBESITY [name] is here to discuss her progress with her obesity treatment plan along with follow-up of her obesity related diagnoses. [name] is on keeping a food journal and adhering to recommended goals of 1200-1500 calories and 80+ grams of protein and states she is following her eating plan approximately 20% of the time. [name] states she is exercising for 0 minutes 0 times per week.  Today's visit was #: 5 Starting weight: 242 lbs Starting date: 06/25/2019 Today's weight: 244 lbs Today's date: 10/02/2019 Total lbs lost to date: 0 Total lbs lost since last in-office visit: 0  Interim History: [name] says she would like to pursue bariatric surgery.  Subjective:   1. Vitamin D deficiency [name]'s Vitamin D level was 28.9 on 09/04/2019. She is currently taking vit D. She denies nausea, vomiting or muscle weakness.  2. Prediabetes [name] has a diagnosis of prediabetes based on her elevated HgA1c and was informed this puts her at greater risk of developing diabetes. She continues to work on diet and exercise to decrease her risk of diabetes. She denies nausea or hypoglycemia.  Lab Results  Component Value Date   HGBA1C 6.2 (H) 09/04/2019   Lab Results  Component Value Date   INSULIN 20.3 09/04/2019   INSULIN 7.6 06/25/2019   3. Other hyperlipidemia [name] has hyperlipidemia and has been trying to improve her cholesterol levels with intensive lifestyle modification including a low saturated fat diet, exercise and weight loss. She denies any chest pain, claudication or myalgias.  Lab Results  Component Value Date   ALT 15 09/04/2019   AST 17 09/04/2019   ALKPHOS 82 09/04/2019   BILITOT 0.2 09/04/2019   Lab Results  Component Value Date   CHOL 312 (H) 09/04/2019   HDL 45 09/04/2019   LDLCALC 245 (H) 09/04/2019   TRIG 120 09/04/2019   CHOLHDL 7 02/05/2018   4. Migraine without status migrainosus, not intractable, unspecified migraine type [name] has  occasional migraine headaches.  5. Other depression, with emotional eating [name] is struggling with emotional eating and using food for comfort to the extent that it is negatively impacting her health. She has been working on behavior modification techniques to help reduce her emotional eating and has been unsuccessful. She shows no sign of suicidal or homicidal ideations.  Assessment/Plan:   1. Vitamin D deficiency Low Vitamin D level contributes to fatigue and are associated with obesity, breast, and colon cancer. She agrees to continue to take prescription Vitamin D @50 ,000 IU every week and will follow-up for routine testing of Vitamin D, at least 2-3 times per year to avoid over-replacement.  2. Prediabetes [name] will continue to work on weight loss, exercise, and decreasing simple carbohydrates to help decrease the risk of diabetes.   3. Other hyperlipidemia Cardiovascular risk and specific lipid/LDL goals reviewed.  We discussed several lifestyle modifications today and [name] will continue to work on diet, exercise and weight loss efforts. Orders and follow up as documented in patient record.   Counseling Intensive lifestyle modifications are the first line treatment for this issue. . Dietary changes: Increase soluble fiber. Decrease simple carbohydrates. . Exercise changes: Moderate to vigorous-intensity aerobic activity 150 minutes per week if tolerated. . Lipid-lowering medications: see documented in medical record.  4. Migraine without status migrainosus, not intractable, unspecified migraine type Counseling: Intensive lifestyle modifications are the first line treatment for this issue. We discussed several lifestyle modifications today and she will continue to work on diet, exercise  and weight loss efforts. We will continue to monitor.  5. Other depression, with emotional eating Behavior modification techniques were discussed today to help [name] deal with her  emotional/non-hunger eating behaviors.  Orders and follow up as documented in patient record.   6. Class 3 severe obesity with serious comorbidity and body mass index (BMI) of 40.0 to 44.9 in adult, unspecified obesity type [facility]) [name] is currently in the action stage of change. As such, her goal is to continue with weight loss efforts. She has agreed to keeping a food journal and adhering to recommended goals of 1200-1500 calories and 80+ grams of protein.   Exercise goals: For substantial health benefits, adults should do at least 150 minutes (2 hours and 30 minutes) a week of moderate-intensity, or 75 minutes (1 hour and 15 minutes) a week of vigorous-intensity aerobic physical activity, or an equivalent combination of moderate- and vigorous-intensity aerobic activity. Aerobic activity should be performed in episodes of at least 10 minutes, and preferably, it should be spread throughout the week.  Behavioral modification strategies: increasing lean protein intake and increasing water intake.  [name] has been referred to [facility] for bariatric surgery evaluation.  [name] has agreed to follow-up with our clinic as needed. She was informed of the importance of frequent follow-up visits to maximize her success with intensive lifestyle modifications for her multiple health conditions.   Objective:   Blood pressure 129/81, pulse 87, temperature 98.1 F (36.7 C), temperature source Oral, height 5\' 5"  (1.651 m), weight 244 lb (110.7 kg), last menstrual period 06/16/2015, SpO2 98 %. Body mass index is 40.6 kg/m.  General: Cooperative, alert, well developed, in no acute distress. HEENT: Conjunctivae and lids unremarkable. Cardiovascular: Regular rhythm.  Lungs: Normal work of breathing. Neurologic: No focal deficits.   Lab Results  Component Value Date   CREATININE 0.94 09/04/2019   BUN 11 09/04/2019   NA 143 09/04/2019   K 4.8 09/04/2019   CL 102 09/04/2019   CO2 30 (H)  09/04/2019   Lab Results  Component Value Date   ALT 15 09/04/2019   AST 17 09/04/2019   ALKPHOS 82 09/04/2019   BILITOT 0.2 09/04/2019   Lab Results  Component Value Date   HGBA1C 6.2 (H) 09/04/2019   HGBA1C 5.7 (H) 06/25/2019   HGBA1C 5.7 (A) 02/05/2018   HGBA1C 6.1 11/06/2017   HGBA1C 6.3 03/20/2017   Lab Results  Component Value Date   INSULIN 20.3 09/04/2019   INSULIN 7.6 06/25/2019   Lab Results  Component Value Date   TSH 2.930 06/25/2019   Lab Results  Component Value Date   CHOL 312 (H) 09/04/2019   HDL 45 09/04/2019   LDLCALC 245 (H) 09/04/2019   TRIG 120 09/04/2019   CHOLHDL 7 02/05/2018   Lab Results  Component Value Date   WBC 4.2 06/25/2019   HGB 12.4 06/25/2019   HCT 38.0 06/25/2019   MCV 90 06/25/2019   PLT 347 06/25/2019   Attestation Statements:   Reviewed by clinician on day of visit: allergies, medications, problem list, medical history, surgical history, family history, social history, and previous encounter notes.  I, Water quality scientist, CMA, am acting as Location manager for [employer], DO.  I have reviewed the above documentation for accuracy and completeness, and I agree with the above. Briscoe Deutscher, DO

## 2019-10-25 ENCOUNTER — Ambulatory Visit: Payer: 59 | Attending: Internal Medicine

## 2019-10-25 DIAGNOSIS — Z23 Encounter for immunization: Secondary | ICD-10-CM

## 2019-10-25 NOTE — Progress Notes (Signed)
   Covid-19 Vaccination Clinic  Name:  [name]    MRN: [id number] DOB: 06-Mar-1971  10/25/2019  [name] was observed post Covid-19 immunization for 15 minutes without incident. She was provided with Vaccine Information Sheet and instruction to access the V-Safe system.   [name] was instructed to call 911 with any severe reactions post vaccine: Marland Kitchen Difficulty breathing  . Swelling of face and throat  . A fast heartbeat  . A bad rash all over body  . Dizziness and weakness   Immunizations Administered    Name Date Dose VIS Date Route   Pfizer COVID-19 Vaccine 10/25/2019  9:17 AM 0.3 mL 07/19/2019 Intramuscular   Manufacturer: [place]   Lot: [id number]   [place]: [id number]

## 2019-10-30 ENCOUNTER — Other Ambulatory Visit: Payer: Self-pay

## 2019-10-30 ENCOUNTER — Ambulatory Visit (INDEPENDENT_AMBULATORY_CARE_PROVIDER_SITE_OTHER): Payer: 59 | Admitting: Family Medicine

## 2019-10-30 ENCOUNTER — Encounter (INDEPENDENT_AMBULATORY_CARE_PROVIDER_SITE_OTHER): Payer: Self-pay | Admitting: Family Medicine

## 2019-10-30 VITALS — BP 123/77 | HR 75 | Temp 98.1°F | Ht 65.0 in | Wt 247.0 lb

## 2019-10-30 DIAGNOSIS — F419 Anxiety disorder, unspecified: Secondary | ICD-10-CM | POA: Diagnosis not present

## 2019-10-30 DIAGNOSIS — E559 Vitamin D deficiency, unspecified: Secondary | ICD-10-CM

## 2019-10-30 DIAGNOSIS — E7849 Other hyperlipidemia: Secondary | ICD-10-CM

## 2019-10-30 DIAGNOSIS — Z9189 Other specified personal risk factors, not elsewhere classified: Secondary | ICD-10-CM | POA: Diagnosis not present

## 2019-10-30 DIAGNOSIS — R7303 Prediabetes: Secondary | ICD-10-CM

## 2019-10-30 DIAGNOSIS — Z6841 Body Mass Index (BMI) 40.0 and over, adult: Secondary | ICD-10-CM

## 2019-10-30 MED ORDER — VITAMIN D (ERGOCALCIFEROL) 1.25 MG (50000 UNIT) PO CAPS: 50000.0000 [IU] | ORAL_CAPSULE | ORAL | 0 refills | Status: DC

## 2019-10-30 NOTE — Progress Notes (Signed)
Chief Complaint:   OBESITY [name] is here to discuss her progress with her obesity treatment plan along with follow-up of her obesity related diagnoses. [name] is on the Category 2 Plan and states she is following her eating plan approximately 30% of the time. [name] states she is exercising for 0 minutes 0 times per week.  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  Subjective:   1. Vitamin D deficiency [name]'s Vitamin D level was 28.9 on 09/04/2019. She is currently taking vit D. She denies nausea, vomiting or muscle weakness.  2. Prediabetes [name] has a diagnosis of prediabetes based on her elevated HgA1c and was informed this puts her at greater risk of developing diabetes. She continues to work on diet and exercise to decrease her risk of diabetes. She denies nausea or hypoglycemia.  Lab Results  Component Value Date   HGBA1C 6.2 (H) 09/04/2019   Lab Results  Component Value Date   INSULIN 20.3 09/04/2019   INSULIN 7.6 06/25/2019   3. Other hyperlipidemia [name] has hyperlipidemia and has been trying to improve her cholesterol levels with intensive lifestyle modification including a low saturated fat diet, exercise and weight loss. She denies any chest pain, claudication or myalgias.  Lab Results  Component Value Date   ALT 15 09/04/2019   AST 17 09/04/2019   ALKPHOS 82 09/04/2019   BILITOT 0.2 09/04/2019   Lab Results  Component Value Date   CHOL 312 (H) 09/04/2019   HDL 45 09/04/2019   LDLCALC 245 (H) 09/04/2019   TRIG 120 09/04/2019   CHOLHDL 7 02/05/2018   4. Anxiety [name] has difficulty with anxiety.  Assessment/Plan:   1. Vitamin D deficiency Low Vitamin D level contributes to fatigue and  are associated with obesity, breast, and colon cancer. She agrees to continue to take prescription Vitamin D @50 ,000 IU every week and will follow-up for routine testing of Vitamin D, at least 2-3 times per year to avoid over-replacement.  Orders - Vitamin D, Ergocalciferol, (DRISDOL) 1.25 MG (50000 UNIT) CAPS capsule; Take 1 capsule (50,000 Units total) by mouth every 7 (seven) days.  Dispense: 4 capsule; Refill: 0  2. Prediabetes Low Vitamin D level contributes to fatigue and are associated with obesity, breast, and colon cancer. She agrees to continue to take prescription Vitamin D @50 ,000 IU every week and will follow-up for routine testing of Vitamin D, at least 2-3 times per year to avoid over-replacement.  3. Other hyperlipidemia Cardiovascular risk and specific lipid/LDL goals reviewed.  We discussed several lifestyle modifications today and [name] will continue to work on diet, exercise and weight loss efforts. Orders and follow up as documented in patient record.   Counseling Intensive lifestyle modifications are the first line treatment for this issue. . Dietary changes: Increase soluble fiber. Decrease simple carbohydrates. . Exercise changes: Moderate to vigorous-intensity aerobic activity 150 minutes per week if tolerated. . Lipid-lowering medications: see documented in medical record.  4. Anxiety Behavior modification techniques were discussed today to help [name] deal with her anxiety.  Orders and follow up as documented in patient record.  Recommended [employer] [facility].  5. At risk for deficient intake of food [name] was given approximately 15 minutes of deficit intake of food  prevention counseling today. [name] is at risk for eating too few calories based on current food recall. She was encouraged to focus on meeting caloric and protein goals according to her recommended meal plan.   6. Class 3 severe obesity with serious comorbidity and body mass index (BMI) of 40.0 to 44.9  in adult, unspecified obesity type [facility]) [name] is currently in the action stage of change. As such, her goal is to continue with weight loss efforts. She has agreed to the Category 2 Plan and the [place].   Exercise goals: For substantial health benefits, adults should do at least 150 minutes (2 hours and 30 minutes) a week of moderate-intensity, or 75 minutes (1 hour and 15 minutes) a week of vigorous-intensity aerobic physical activity, or an equivalent combination of moderate- and vigorous-intensity aerobic activity. Aerobic activity should be performed in episodes of at least 10 minutes, and preferably, it should be spread throughout the week.  Behavioral modification strategies: increasing lean protein intake and increasing water intake.  [name] has agreed to follow-up with our clinic in 2 weeks. She was informed of the importance of frequent follow-up visits to maximize her success with intensive lifestyle modifications for her multiple health conditions.   Objective:   Blood pressure 123/77, pulse 75, temperature 98.1 F (36.7 C), temperature source Oral, height 5\' 5"  (1.651 m), weight 247 lb (112 kg), last menstrual period 06/16/2015, SpO2 98 %. Body mass index is 41.1 kg/m.  General: Cooperative, alert, well developed, in no acute distress. HEENT: Conjunctivae and lids unremarkable. Cardiovascular: Regular rhythm.  Lungs: Normal work of breathing. Neurologic: No focal deficits.   Lab Results  Component Value Date   CREATININE 0.94 09/04/2019   BUN 11 09/04/2019   NA 143 09/04/2019   K 4.8 09/04/2019   CL 102 09/04/2019   CO2 30 (H) 09/04/2019   Lab Results  Component Value Date   ALT 15 09/04/2019   AST 17 09/04/2019   ALKPHOS 82 09/04/2019   BILITOT 0.2 09/04/2019   Lab Results  Component Value Date   HGBA1C 6.2 (H) 09/04/2019   HGBA1C 5.7 (H) 06/25/2019   HGBA1C 5.7 (A) 02/05/2018   HGBA1C 6.1 11/06/2017   HGBA1C 6.3 03/20/2017   Lab Results    Component Value Date   INSULIN 20.3 09/04/2019   INSULIN 7.6 06/25/2019   Lab Results  Component Value Date   TSH 2.930 06/25/2019   Lab Results  Component Value Date   CHOL 312 (H) 09/04/2019   HDL 45 09/04/2019   LDLCALC 245 (H) 09/04/2019   TRIG 120 09/04/2019   CHOLHDL 7 02/05/2018   Lab Results  Component Value Date   WBC 4.2 06/25/2019   HGB 12.4 06/25/2019   HCT 38.0 06/25/2019   MCV 90 06/25/2019   PLT 347 06/25/2019   Attestation Statements:   Reviewed by clinician on day of visit: allergies, medications, problem list, medical history, surgical history, family history, social history, and previous encounter notes.  I, Water quality scientist, CMA, am acting as Location manager for [employer], DO.  I have reviewed the above documentation for accuracy and completeness, and I agree with the above. Briscoe Deutscher, DO

## 2019-11-18 ENCOUNTER — Encounter (INDEPENDENT_AMBULATORY_CARE_PROVIDER_SITE_OTHER): Payer: Self-pay | Admitting: Family Medicine

## 2019-11-18 ENCOUNTER — Ambulatory Visit (INDEPENDENT_AMBULATORY_CARE_PROVIDER_SITE_OTHER): Payer: 59 | Admitting: Family Medicine

## 2019-11-18 ENCOUNTER — Other Ambulatory Visit: Payer: Self-pay

## 2019-11-18 VITALS — BP 114/67 | HR 64 | Temp 98.3°F | Ht 65.0 in | Wt 242.0 lb

## 2019-11-18 DIAGNOSIS — Z6841 Body Mass Index (BMI) 40.0 and over, adult: Secondary | ICD-10-CM

## 2019-11-18 DIAGNOSIS — R7303 Prediabetes: Secondary | ICD-10-CM

## 2019-11-18 DIAGNOSIS — Z9189 Other specified personal risk factors, not elsewhere classified: Secondary | ICD-10-CM

## 2019-11-18 DIAGNOSIS — E559 Vitamin D deficiency, unspecified: Secondary | ICD-10-CM

## 2019-11-18 DIAGNOSIS — E7849 Other hyperlipidemia: Secondary | ICD-10-CM

## 2019-11-18 DIAGNOSIS — F419 Anxiety disorder, unspecified: Secondary | ICD-10-CM

## 2019-11-18 NOTE — Progress Notes (Signed)
Chief Complaint:   OBESITY [name] is here to discuss her progress with her obesity treatment plan along with follow-up of her obesity related diagnoses. [name] is on the [employer] and states she is following her eating plan approximately 5% of the time. Teshawn states she is working in the yard for 3 hours a couple of times per week.  Today's visit was #: 7 Starting weight: 242 lbs Starting date: 06/25/2019 Today's weight: 242 lbs Today's date: 11/18/2019 Total lbs lost to date: 0 Total lbs lost since last in-office visit: 0  Interim History: [name] has been able to get back to working at home.  She went to [employer].  She reports that she wants to wait.  She has also looked at the [employer] website.  She says she doesn't eat when she is stressed.  She says that it has been helpful to her if she understands how much protein she needs.    She would like to focus on: 1.  21 day challenge with trainers. 2.  1200/60 grams of protein.  Watching carbs. 3.  Journaling thoughts.  Subjective:   1. Prediabetes Aubriana has a diagnosis of prediabetes based on her elevated HgA1c and was informed this puts her at greater risk of developing diabetes. She continues to work on diet and exercise to decrease her risk of diabetes. She denies nausea or hypoglycemia.  Lab Results  Component Value Date   HGBA1C 6.2 (H) 09/04/2019   Lab Results  Component Value Date   INSULIN 20.3 09/04/2019   INSULIN 7.6 06/25/2019   2. Vitamin D deficiency Zimal's Vitamin D level was 28.9 on 09/04/2019. She is currently taking prescription vitamin D 50,000 IU each week. She denies nausea, vomiting or muscle weakness.  3. Other hyperlipidemia Heavenlee has hyperlipidemia and has been trying to improve her cholesterol levels with intensive lifestyle modification including a low saturated fat diet, exercise and weight loss. She denies any chest pain, claudication or  myalgias.  Lab Results  Component Value Date   ALT 15 09/04/2019   AST 17 09/04/2019   ALKPHOS 82 09/04/2019   BILITOT 0.2 09/04/2019   Lab Results  Component Value Date   CHOL 312 (H) 09/04/2019   HDL 45 09/04/2019   LDLCALC 245 (H) 09/04/2019   TRIG 120 09/04/2019   CHOLHDL 7 02/05/2018   4. Anxiety Marsheila has some anxiety about COVID. She has not yet bene vaccinated for COVID.  Assessment/Plan:   1. Prediabetes [name] will continue to work on weight loss, exercise, and decreasing simple carbohydrates to help decrease the risk of diabetes.   2. Vitamin D deficiency Low Vitamin D level contributes to fatigue and are associated with obesity, breast, and colon cancer. She agrees to continue to take prescription Vitamin D @50 ,000 IU every week and will follow-up for routine testing of Vitamin D, at least 2-3 times per year to avoid over-replacement.  3. Other hyperlipidemia Cardiovascular risk and specific lipid/LDL goals reviewed.  We discussed several lifestyle modifications today and [name] will continue to work on diet, exercise and weight loss efforts. Orders and follow up as documented in patient record.   Counseling Intensive lifestyle modifications are the first line treatment for this issue. . Dietary changes: Increase soluble fiber. Decrease simple carbohydrates. . Exercise changes: Moderate to vigorous-intensity aerobic activity 150 minutes per week if tolerated. . Lipid-lowering medications: see documented in medical record.  4. Anxiety Behavior modification techniques were discussed today to  help [name] deal with her anxiety.  Orders and follow up as documented in patient record.   5. At risk for deficient intake of food [name] was given approximately 15 minutes of deficit intake of food prevention counseling today. [name] is at risk for eating too few calories based on current food recall. She was encouraged to focus on meeting caloric and protein goals according to her  recommended meal plan.   6. Class 3 severe obesity with serious comorbidity and body mass index (BMI) of 40.0 to 44.9 in adult, unspecified obesity type [facility])  [name] is currently in the action stage of change. As such, her goal is to continue with weight loss efforts. She has agreed to keeping a food journal and adhering to recommended goals of 1200 calories and 100 grams of protein.   Exercise goals: For substantial health benefits, adults should do at least 150 minutes (2 hours and 30 minutes) a week of moderate-intensity, or 75 minutes (1 hour and 15 minutes) a week of vigorous-intensity aerobic physical activity, or an equivalent combination of moderate- and vigorous-intensity aerobic activity. Aerobic activity should be performed in episodes of at least 10 minutes, and preferably, it should be spread throughout the week.  Behavioral modification strategies: increasing lean protein intake and keeping a strict food journal.  [name] has agreed to follow-up with our clinic in 2 weeks. She was informed of the importance of frequent follow-up visits to maximize her success with intensive lifestyle modifications for her multiple health conditions.   Objective:   Blood pressure 114/67, pulse 64, temperature 98.3 F (36.8 C), temperature source Oral, height 5\' 5"  (1.651 m), weight 242 lb (109.8 kg), last menstrual period 06/16/2015, SpO2 97 %. Body mass index is 40.27 kg/m.  General: Cooperative, alert, well developed, in no acute distress. HEENT: Conjunctivae and lids unremarkable. Cardiovascular: Regular rhythm.  Lungs: Normal work of breathing. Neurologic: No focal deficits.   Lab Results  Component Value Date   CREATININE 0.94 09/04/2019   BUN 11 09/04/2019   NA 143 09/04/2019   K 4.8 09/04/2019   CL 102 09/04/2019   CO2 30 (H) 09/04/2019   Lab Results  Component Value Date   ALT 15 09/04/2019   AST 17 09/04/2019   ALKPHOS 82 09/04/2019   BILITOT 0.2 09/04/2019   Lab Results   Component Value Date   HGBA1C 6.2 (H) 09/04/2019   HGBA1C 5.7 (H) 06/25/2019   HGBA1C 5.7 (A) 02/05/2018   HGBA1C 6.1 11/06/2017   HGBA1C 6.3 03/20/2017   Lab Results  Component Value Date   INSULIN 20.3 09/04/2019   INSULIN 7.6 06/25/2019   Lab Results  Component Value Date   TSH 2.930 06/25/2019   Lab Results  Component Value Date   CHOL 312 (H) 09/04/2019   HDL 45 09/04/2019   LDLCALC 245 (H) 09/04/2019   TRIG 120 09/04/2019   CHOLHDL 7 02/05/2018   Lab Results  Component Value Date   WBC 4.2 06/25/2019   HGB 12.4 06/25/2019   HCT 38.0 06/25/2019   MCV 90 06/25/2019   PLT 347 06/25/2019   Attestation Statements:   Reviewed by clinician on day of visit: allergies, medications, problem list, medical history, surgical history, family history, social history, and previous encounter notes.  I, Water quality scientist, CMA, am acting as Location manager for [employer], DO.  I have reviewed the above documentation for accuracy and completeness, and I agree with the above. Briscoe Deutscher, DO

## 2019-11-19 ENCOUNTER — Ambulatory Visit: Payer: 59 | Attending: Internal Medicine

## 2019-11-19 DIAGNOSIS — Z23 Encounter for immunization: Secondary | ICD-10-CM

## 2019-11-19 NOTE — Progress Notes (Signed)
   Covid-19 Vaccination Clinic  Name:  [name]    MRN: [id number] DOB: [birth year]  11/19/2019  [name] was observed post Covid-19 immunization for 15 minutes without incident. She was provided with Vaccine Information Sheet and instruction to access the V-Safe system.   [name] was instructed to call 911 with any severe reactions post vaccine: Marland Kitchen Difficulty breathing  . Swelling of face and throat  . A fast heartbeat  . A bad rash all over body  . Dizziness and weakness   Immunizations Administered    Name Date Dose VIS Date Route   Pfizer COVID-19 Vaccine 11/19/2019  4:59 PM 0.3 mL 07/19/2019 Intramuscular   Manufacturer: [place]   Lot: [id number]   [place]: [id number]

## 2019-11-28 ENCOUNTER — Telehealth (INDEPENDENT_AMBULATORY_CARE_PROVIDER_SITE_OTHER): Payer: 59 | Admitting: Family Medicine

## 2019-11-28 DIAGNOSIS — R21 Rash and other nonspecific skin eruption: Secondary | ICD-10-CM | POA: Diagnosis not present

## 2019-11-28 NOTE — Progress Notes (Signed)
   [name] is a 49 y.o. female who presents today for a virtual office visit.  Assessment/Plan:  New/Acute Problems: Rash Patient without any vesicular lesions -doubt that her rash was due to herpes outbreak.  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  Given she has not had any vesicular lesions, we will stop acyclovir.  Rash likely contact dermatitis related to several harsh products that she had used.  Advised her to avoid all harsh products.  She can continue using Carmex or Vaseline as needed.  Discussed reasons to return to care.    Subjective:  HPI:  Patient received covid vaccine 9 days ago. About 2 days afterwards she started having a "funny feeling" on her lips. Shortly afterwards started developing a rash on her face.  Rash is described as very fine bumps.  She used several over-the-counter products including facial wash, tea tree oil, Dial soap, and Benadryl.  Symptoms continue to worsen and she was seen in urgent care.  Urgent care provider told her that she had a cold sore.  She was started on acyclovir.  She has been on that.  Symptoms do not improve over a couple of days and she was seen at urgent care again at that time had blood work done.  She was noted to have low WBCs and a positive HSV 1 antibody.  Symptoms are improving modestly.  She is no longer using any harsh facial products.  She has been using Carmex ointment.       Objective/Observations  Physical Exam: Gen: NAD, resting comfortably Pulm: Normal work of breathing Neuro: Grossly normal, moves all extremities Psych: Normal affect and thought content  Virtual Visit via Video   I connected with Helayne Seminole on 11/28/19 at  4:00 PM EDT by a video enabled telemedicine application and verified that I am speaking with the correct person using two identifiers. The limitations of evaluation and management by telemedicine  and the availability of in person appointments were discussed. The patient expressed understanding and agreed to proceed.   Patient location: Home Provider location: [place] [facility] Persons participating in the virtual visit: Myself and Patient  Time Spent: 25 minutes of total time was spent on the date of the encounter performing the following actions: chart review prior to seeing the patient, obtaining history, performing a medically necessary exam, counseling on the treatment plan, placing orders, and documenting in our EHR.       Algis Greenhouse. Jerline Pain, MD 11/28/2019 11:23 AM

## 2019-12-13 ENCOUNTER — Encounter: Payer: 59 | Admitting: Family Medicine

## 2019-12-16 ENCOUNTER — Ambulatory Visit (INDEPENDENT_AMBULATORY_CARE_PROVIDER_SITE_OTHER): Payer: 59 | Admitting: Family Medicine

## 2020-01-27 ENCOUNTER — Ambulatory Visit (INDEPENDENT_AMBULATORY_CARE_PROVIDER_SITE_OTHER): Payer: 59 | Admitting: Family Medicine

## 2020-01-30 ENCOUNTER — Ambulatory Visit (INDEPENDENT_AMBULATORY_CARE_PROVIDER_SITE_OTHER): Payer: 59 | Admitting: Family Medicine

## 2020-02-03 ENCOUNTER — Encounter (INDEPENDENT_AMBULATORY_CARE_PROVIDER_SITE_OTHER): Payer: Self-pay | Admitting: Family Medicine

## 2020-02-03 ENCOUNTER — Ambulatory Visit (INDEPENDENT_AMBULATORY_CARE_PROVIDER_SITE_OTHER): Payer: 59 | Admitting: Family Medicine

## 2020-02-03 ENCOUNTER — Encounter: Payer: Self-pay | Admitting: Gastroenterology

## 2020-02-03 ENCOUNTER — Other Ambulatory Visit: Payer: Self-pay

## 2020-02-03 VITALS — BP 130/74 | HR 70 | Temp 98.5°F | Ht 65.0 in | Wt 247.0 lb

## 2020-02-03 DIAGNOSIS — G8929 Other chronic pain: Secondary | ICD-10-CM

## 2020-02-03 DIAGNOSIS — E7800 Pure hypercholesterolemia, unspecified: Secondary | ICD-10-CM

## 2020-02-03 DIAGNOSIS — E66813 Obesity, class 3: Secondary | ICD-10-CM

## 2020-02-03 DIAGNOSIS — E559 Vitamin D deficiency, unspecified: Secondary | ICD-10-CM

## 2020-02-03 DIAGNOSIS — R1310 Dysphagia, unspecified: Secondary | ICD-10-CM

## 2020-02-03 DIAGNOSIS — R7303 Prediabetes: Secondary | ICD-10-CM | POA: Diagnosis not present

## 2020-02-03 DIAGNOSIS — R002 Palpitations: Secondary | ICD-10-CM

## 2020-02-03 DIAGNOSIS — M25512 Pain in left shoulder: Secondary | ICD-10-CM | POA: Diagnosis not present

## 2020-02-03 DIAGNOSIS — Z6841 Body Mass Index (BMI) 40.0 and over, adult: Secondary | ICD-10-CM

## 2020-02-03 MED ORDER — VITAMIN D (ERGOCALCIFEROL) 1.25 MG (50000 UNIT) PO CAPS: 50000.0000 [IU] | ORAL_CAPSULE | ORAL | 0 refills | Status: DC

## 2020-02-03 NOTE — Progress Notes (Signed)
Chief Complaint:   OBESITY [name] is here to discuss her progress with her obesity treatment plan along with follow-up of her obesity related diagnoses. [name] is on keeping a food journal and adhering to recommended goals of 1200 calories and 100 grams of protein and states she is following her eating plan approximately 0% of the time. [name] states she is exercising for 0 minutes 0 times per week.  Today's visit was #: 8 Starting weight: 242 lbs Starting date: 06/25/2019 Today's weight: 247 lbs Today's date: 02/03/2020 Total lbs lost to date: 0 Total lbs lost since last in-office visit: 0  Interim History: [name] says she has felt bad since getting the COVID vaccine.  She has had lethargy and palpitations.  She is taking ibuprofen in case of myocarditis.  Her resting heart rate has ranged from 68-75; max 80s.  Subjective:   1. Vitamin D deficiency [name]'s Vitamin D level was 28.9 on 09/04/2019. She is currently taking prescription vitamin D 50,000 IU each week. She denies nausea, vomiting or muscle weakness.  2. Prediabetes [name] has a diagnosis of prediabetes based on her elevated HgA1c and was informed this puts her at greater risk of developing diabetes. She continues to work on diet and exercise to decrease her risk of diabetes. She denies nausea or hypoglycemia.  Lab Results  Component Value Date   HGBA1C 6.2 (H) 09/04/2019   Lab Results  Component Value Date   INSULIN 20.3 09/04/2019   INSULIN 7.6 06/25/2019   3. Pure hypercholesterolemia [name] has hyperlipidemia and has been trying to improve her cholesterol levels with intensive lifestyle modification including a low saturated fat diet, exercise and weight loss. She denies any chest pain, claudication or myalgias.  Lab Results  Component Value Date   ALT 15 09/04/2019   AST 17 09/04/2019   ALKPHOS 82 09/04/2019   BILITOT 0.2 09/04/2019   Lab Results  Component Value Date   CHOL 312 (H) 09/04/2019   HDL 45  09/04/2019   LDLCALC 245 (H) 09/04/2019   TRIG 120 09/04/2019   CHOLHDL 7 02/05/2018   4. Chronic periscapular pain on left side [name] has chronic periscapular pain on the left side.  5. Odynophagia [name] has odynophagia and is taking famotidine.  6. Palpitations She has been having some recent palpitations.  She says they have been occurring since she received the COVID vaccine (last on 11/19/2019).  Assessment/Plan:   1. Vitamin D deficiency Low Vitamin D level contributes to fatigue and are associated with obesity, breast, and colon cancer. She agrees to continue to take prescription Vitamin D @50 ,000 IU every week and will follow-up for routine testing of Vitamin D, at least 2-3 times per year to avoid over-replacement.  Orders - Vitamin D, Ergocalciferol, (DRISDOL) 1.25 MG (50000 UNIT) CAPS capsule; Take 1 capsule (50,000 Units total) by mouth every 7 (seven) days.  Dispense: 4 capsule; Refill: 0  2. Prediabetes [name] will continue to work on weight loss, exercise, and decreasing simple carbohydrates to help decrease the risk of diabetes.   Orders - Hemoglobin A1c - Lipid Panel With LDL/HDL Ratio - TSH - T4, free - T3  3. Pure hypercholesterolemia Cardiovascular risk and specific lipid/LDL goals reviewed.  We discussed several lifestyle modifications today and [name] will continue to work on diet, exercise and weight loss efforts. Orders and follow up as documented in patient record.   Counseling Intensive lifestyle modifications are the first line treatment for this issue. . Dietary changes: Increase  soluble fiber. Decrease simple carbohydrates. . Exercise changes: Moderate to vigorous-intensity aerobic activity 150 minutes per week if tolerated. . Lipid-lowering medications: see documented in medical record. - CBC with Differential/Platelet - Comprehensive metabolic panel - Lipid Panel With LDL/HDL Ratio - TSH - T4, free - T3  4. Chronic periscapular pain on left  side We will continue to monitor.  5. Odynophagia Referral has been placed for [facility] to see GI.  Orders - Ambulatory referral to Gastroenterology - Anemia panel - CBC with Differential/Platelet - Comprehensive metabolic panel - VITAMIN D 25 Hydroxy (Vit-D Deficiency, Fractures)  6. Palpitations Advise [name] to stop taking Ashwaganda.  7. Class 3 severe obesity with serious comorbidity and body mass index (BMI) of 40.0 to 44.9 in adult, unspecified obesity type [facility]) [name] is currently in the action stage of change. As such, her goal is to continue with weight loss efforts. She has agreed to practicing portion control and making smarter food choices, such as increasing vegetables and decreasing simple carbohydrates.   Exercise goals: For substantial health benefits, adults should do at least 150 minutes (2 hours and 30 minutes) a week of moderate-intensity, or 75 minutes (1 hour and 15 minutes) a week of vigorous-intensity aerobic physical activity, or an equivalent combination of moderate- and vigorous-intensity aerobic activity. Aerobic activity should be performed in episodes of at least 10 minutes, and preferably, it should be spread throughout the week.  Behavioral modification strategies: increasing lean protein intake.  [name] has agreed to follow-up with our clinic in 4 weeks. She was informed of the importance of frequent follow-up visits to maximize her success with intensive lifestyle modifications for her multiple health conditions.   [name] was informed we would discuss her lab results at her next visit unless there is a critical issue that needs to be addressed sooner. [name] agreed to keep her next visit at the agreed upon time to discuss these results.  Objective:   Blood pressure 130/74, pulse 70, temperature 98.5 F (36.9 C), temperature source Oral, height 5\' 5"  (1.651 m), weight 247 lb (112 kg), last menstrual period 06/16/2015, SpO2 99 %. Body mass index is 41.1  kg/m.  General: Cooperative, alert, well developed, in no acute distress. HEENT: Conjunctivae and lids unremarkable. Cardiovascular: Regular rhythm.  Lungs: Normal work of breathing. Neurologic: No focal deficits.   Lab Results  Component Value Date   CREATININE 0.94 09/04/2019   BUN 11 09/04/2019   NA 143 09/04/2019   K 4.8 09/04/2019   CL 102 09/04/2019   CO2 30 (H) 09/04/2019   Lab Results  Component Value Date   ALT 15 09/04/2019   AST 17 09/04/2019   ALKPHOS 82 09/04/2019   BILITOT 0.2 09/04/2019   Lab Results  Component Value Date   HGBA1C 6.2 (H) 09/04/2019   HGBA1C 5.7 (H) 06/25/2019   HGBA1C 5.7 (A) 02/05/2018   HGBA1C 6.1 11/06/2017   HGBA1C 6.3 03/20/2017   Lab Results  Component Value Date   INSULIN 20.3 09/04/2019   INSULIN 7.6 06/25/2019   Lab Results  Component Value Date   TSH 2.930 06/25/2019   Lab Results  Component Value Date   CHOL 312 (H) 09/04/2019   HDL 45 09/04/2019   LDLCALC 245 (H) 09/04/2019   TRIG 120 09/04/2019   CHOLHDL 7 02/05/2018   Lab Results  Component Value Date   WBC 4.2 06/25/2019   HGB 12.4 06/25/2019   HCT 38.0 06/25/2019   MCV 90 06/25/2019   PLT 347 06/25/2019  Attestation Statements:   Reviewed by clinician on day of visit: allergies, medications, problem list, medical history, surgical history, family history, social history, and previous encounter notes.  I, Water quality scientist, CMA, am acting as transcriptionist for Briscoe Deutscher, DO  I have reviewed the above documentation for accuracy and completeness, and I agree with the above. Briscoe Deutscher, DO

## 2020-02-04 LAB — COMPREHENSIVE METABOLIC PANEL
ALT: 14 IU/L (ref 0–32)
AST: 19 IU/L (ref 0–40)
Albumin/Globulin Ratio: 1.1 — ABNORMAL LOW (ref 1.2–2.2)
Albumin: 3.9 g/dL (ref 3.8–4.8)
Alkaline Phosphatase: 77 IU/L (ref 48–121)
BUN/Creatinine Ratio: 10 (ref 9–23)
BUN: 9 mg/dL (ref 6–24)
Bilirubin Total: 0.3 mg/dL (ref 0.0–1.2)
CO2: 26 mmol/L (ref 20–29)
Calcium: 8.9 mg/dL (ref 8.7–10.2)
Chloride: 100 mmol/L (ref 96–106)
Creatinine, Ser: 0.89 mg/dL (ref 0.57–1.00)
GFR calc Af Amer: 88 mL/min/{1.73_m2} (ref >59)
GFR calc non Af Amer: 76 mL/min/{1.73_m2} (ref >59)
Globulin, Total: 3.5 g/dL (ref 1.5–4.5)
Glucose: 84 mg/dL (ref 65–99)
Potassium: 4.7 mmol/L (ref 3.5–5.2)
Sodium: 135 mmol/L (ref 134–144)
Total Protein: 7.4 g/dL (ref 6.0–8.5)

## 2020-02-04 LAB — CBC WITH DIFFERENTIAL/PLATELET
Basophils Absolute: 0 10*3/uL (ref 0.0–0.2)
Basos: 1 %
EOS (ABSOLUTE): 0.1 10*3/uL (ref 0.0–0.4)
Eos: 3 %
Hemoglobin: 12.4 g/dL (ref 11.1–15.9)
Immature Grans (Abs): 0 10*3/uL (ref 0.0–0.1)
Immature Granulocytes: 0 %
Lymphocytes Absolute: 1.6 10*3/uL (ref 0.7–3.1)
Lymphs: 37 %
MCH: 29.1 pg (ref 26.6–33.0)
MCHC: 32.3 g/dL (ref 31.5–35.7)
MCV: 90 fL (ref 79–97)
Monocytes Absolute: 0.6 10*3/uL (ref 0.1–0.9)
Monocytes: 13 %
Neutrophils Absolute: 2 10*3/uL (ref 1.4–7.0)
Neutrophils: 46 %
Platelets: 345 10*3/uL (ref 150–450)
RBC: 4.26 x10E6/uL (ref 3.77–5.28)
RDW: 13.9 % (ref 11.7–15.4)
WBC: 4.3 10*3/uL (ref 3.4–10.8)

## 2020-02-04 LAB — LIPID PANEL WITH LDL/HDL RATIO
Cholesterol, Total: 280 mg/dL — ABNORMAL HIGH (ref 100–199)
HDL: 44 mg/dL (ref >39)
LDL Chol Calc (NIH): 219 mg/dL — ABNORMAL HIGH (ref 0–99)
LDL/HDL Ratio: 5 ratio — ABNORMAL HIGH (ref 0.0–3.2)
Triglycerides: 96 mg/dL (ref 0–149)
VLDL Cholesterol Cal: 17 mg/dL (ref 5–40)

## 2020-02-04 LAB — HEMOGLOBIN A1C
Est. average glucose Bld gHb Est-mCnc: 128 mg/dL
Hgb A1c MFr Bld: 6.1 % — ABNORMAL HIGH (ref 4.8–5.6)

## 2020-02-04 LAB — ANEMIA PANEL
Ferritin: 96 ng/mL (ref 15–150)
Folate, Hemolysate: 356 ng/mL
Folate, RBC: 927 ng/mL (ref >498)
Hematocrit: 38.4 % (ref 34.0–46.6)
Iron Saturation: 33 % (ref 15–55)
Iron: 111 ug/dL (ref 27–159)
Retic Ct Pct: 0.9 % (ref 0.6–2.6)
Total Iron Binding Capacity: 333 ug/dL (ref 250–450)
UIBC: 222 ug/dL (ref 131–425)
Vitamin B-12: 1619 pg/mL — ABNORMAL HIGH (ref 232–1245)

## 2020-02-04 LAB — VITAMIN D 25 HYDROXY (VIT D DEFICIENCY, FRACTURES): Vit D, 25-Hydroxy: 25.8 ng/mL — ABNORMAL LOW (ref 30.0–100.0)

## 2020-02-04 LAB — TSH: TSH: 3.2 u[IU]/mL (ref 0.450–4.500)

## 2020-02-04 LAB — T3: T3, Total: 111 ng/dL (ref 71–180)

## 2020-02-04 LAB — T4, FREE: Free T4: 1.01 ng/dL (ref 0.82–1.77)

## 2020-02-18 ENCOUNTER — Encounter: Payer: 59 | Admitting: Family Medicine

## 2020-02-18 ENCOUNTER — Ambulatory Visit (INDEPENDENT_AMBULATORY_CARE_PROVIDER_SITE_OTHER): Payer: 59 | Admitting: Family Medicine

## 2020-02-18 ENCOUNTER — Encounter: Payer: Self-pay | Admitting: Family Medicine

## 2020-02-18 ENCOUNTER — Other Ambulatory Visit: Payer: Self-pay

## 2020-02-18 VITALS — BP 129/79 | HR 69 | Temp 98.3°F | Ht 65.0 in | Wt 253.2 lb

## 2020-02-18 DIAGNOSIS — R7303 Prediabetes: Secondary | ICD-10-CM

## 2020-02-18 DIAGNOSIS — R7989 Other specified abnormal findings of blood chemistry: Secondary | ICD-10-CM

## 2020-02-18 DIAGNOSIS — E7800 Pure hypercholesterolemia, unspecified: Secondary | ICD-10-CM

## 2020-02-18 DIAGNOSIS — K2100 Gastro-esophageal reflux disease with esophagitis, without bleeding: Secondary | ICD-10-CM

## 2020-02-18 DIAGNOSIS — G8929 Other chronic pain: Secondary | ICD-10-CM

## 2020-02-18 DIAGNOSIS — Z0000 Encounter for general adult medical examination without abnormal findings: Secondary | ICD-10-CM | POA: Insufficient documentation

## 2020-02-18 DIAGNOSIS — E559 Vitamin D deficiency, unspecified: Secondary | ICD-10-CM

## 2020-02-18 DIAGNOSIS — G4730 Sleep apnea, unspecified: Secondary | ICD-10-CM

## 2020-02-18 DIAGNOSIS — Z1211 Encounter for screening for malignant neoplasm of colon: Secondary | ICD-10-CM

## 2020-02-18 DIAGNOSIS — M25512 Pain in left shoulder: Secondary | ICD-10-CM

## 2020-02-18 DIAGNOSIS — Z0001 Encounter for general adult medical examination with abnormal findings: Secondary | ICD-10-CM | POA: Diagnosis not present

## 2020-02-18 DIAGNOSIS — K219 Gastro-esophageal reflux disease without esophagitis: Secondary | ICD-10-CM | POA: Insufficient documentation

## 2020-02-18 MED ORDER — VITAMIN D (ERGOCALCIFEROL) 1.25 MG (50000 UNIT) PO CAPS: 50000.0000 [IU] | ORAL_CAPSULE | ORAL | 3 refills | Status: DC

## 2020-02-18 NOTE — Assessment & Plan Note (Signed)
Restart vitamin D 50,000 international units once weekly.  Recheck vitamin D level in 3 to 6 months.

## 2020-02-18 NOTE — Assessment & Plan Note (Signed)
Continue famotidine as needed.  She will be following up with GI soon.  She has been having a small amount of dysphagia.  Her appointment with GI is already been scheduled.

## 2020-02-18 NOTE — Progress Notes (Signed)
Chief Complaint:  [name] is a 49 y.o. female who presents today for her annual comprehensive physical exam.    Assessment/Plan:  Chronic Problems Addressed Today: Prediabetes Working on weight management.   Vitamin D deficiency Restart vitamin D 50,000 international units once weekly.  Recheck vitamin D level in 3 to 6 months.  Chronic left shoulder pain Possibly rhomboid strain.  Will place referral to physical therapy given the symptoms today going on for 4 years.  Sleep-disordered breathing Patient deferred referral for sleep study at this time.  She is currently managing well.  No excessive daytime somnolence.  GERD (gastroesophageal reflux disease) Continue famotidine as needed.  She will be following up with GI soon.  She has been having a small amount of dysphagia.  Her appointment with GI is already been scheduled.  Pure hypercholesterolemia She is working on weight loss.  Morbid obesity ([place]) Continue management per weight management.   Preventative Healthcare: Does not need Pap.  Recently had labs couple weeks ago.  She will be referred for colonoscopy.  She will schedule mammogram soon.  Patient Counseling(The following topics were reviewed and/or handout was given):  -Nutrition: Stressed importance of moderation in sodium/caffeine intake, saturated fat and cholesterol, caloric balance, sufficient intake of fresh fruits, vegetables, and fiber.  -Stressed the importance of regular exercise.   -Substance Abuse: Discussed cessation/primary prevention of tobacco, alcohol, or other drug use; driving or other dangerous activities under the influence; availability of treatment for abuse.   -Injury prevention: Discussed safety belts, safety helmets, smoke detector, smoking near bedding or upholstery.   -Sexuality: Discussed sexually transmitted diseases, partner selection, use of condoms, avoidance of unintended pregnancy and contraceptive alternatives.   -Dental  health: Discussed importance of regular tooth brushing, flossing, and dental visits.  -Health maintenance and immunizations reviewed. Please refer to Health maintenance section.  Return to care in 1 year for next preventative visit.     Subjective:  HPI:  She has no acute complaints today.   See A/p for status of chronic conditions.   Lifestyle Diet: Working with medical weight management.  Exercise: Stays very active.   Depression screen PHQ 2/9 06/25/2019  Decreased Interest 2  Down, Depressed, Hopeless 2  PHQ - 2 Score 4  Altered sleeping 1  Tired, decreased energy 2  Change in appetite 0  Feeling bad or failure about yourself  1  Trouble concentrating 1  Moving slowly or fidgety/restless 0  Suicidal thoughts 0  PHQ-9 Score 9  Difficult doing work/chores Not difficult at all    Health Maintenance Due  Topic Date Due  . Hepatitis C Screening  Never done  . MAMMOGRAM  05/23/2019     ROS: Per HPI, otherwise a complete review of systems was negative.   PMH:  The following were reviewed and entered/updated in epic: Past Medical History:  Diagnosis Date  . ADD (attention deficit disorder)   . Anxiety   . Back pain   . Blood pressure elevated without history of HTN   . Chest pain   . Constipation   . Depression   . GERD (gastroesophageal reflux disease)   . HLD (hyperlipidemia)   . Iron deficiency anemia due to chronic blood loss 10/15/2015  . Migraine   . Morbid obesity ([place]) 03/20/2017  . Swallowing difficulty   . Uterine leiomyoma 07/06/2015  . Vitamin D deficiency    Patient Active Problem List   Diagnosis Date Noted  . GERD (gastroesophageal reflux disease) 02/18/2020  .  Sleep-disordered breathing 02/18/2020  . Chronic left shoulder pain 02/18/2020  . Vitamin D deficiency 02/18/2020  . Prediabetes 02/18/2020  . 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 Pure hypercholesterolemia 04/02/2017  . Insulin  resistance 04/02/2017  . Morbid obesity ([place]) 03/20/2017  . Hematuria 03/20/2017  . Migraine with aura and without status migrainosus, not intractable 03/20/2017  . Iron deficiency anemia due to chronic blood loss 10/15/2015  . Moderate episode of recurrent major depressive disorder ([place]) 10/15/2015   Past Surgical History:  Procedure Laterality Date  . ABDOMINAL HYSTERECTOMY N/A 07/06/2015   Procedure: HYSTERECTOMY ABDOMINALwith removal of portion of left ovary;  Surgeon: Newton Pigg, MD;  Location: [facility];  Service: Gynecology;  Laterality: N/A;  Needs #1 Novafil Popoffs if Midline incision  . ANKLE FRACTURE SURGERY    . DILATION AND CURETTAGE OF UTERUS  2004  . FRACTURE SURGERY    . HAND SURGERY    . ORIF ANKLE FRACTURE Right 10/03/2013   Procedure: OPEN REDUCTION INTERNAL FIXATION (ORIF) ANKLE FRACTURE;  Surgeon: Newt Minion, MD;  Location: [facility];  Service: Orthopedics;  Laterality: Right;  Open Reduction Internal Fixation Right Ankle  . UTERINE FIBROID SURGERY  2002   Myomectomy     Family History  Problem Relation Age of Onset  . Breast cancer Maternal Aunt 30  . Breast cancer Cousin 35  . Cancer Mother   . Diabetes Mother   . Hypertension Mother   . High Cholesterol Mother   . Depression Mother   . Sleep apnea Mother   . Obesity Mother   . Diabetes Father   . Heart disease Father   . High Cholesterol Father   . Hypertension Father   . Hypertension Sister   . Heart attack Maternal Grandmother   . Diabetes Maternal Grandmother     Medications- reviewed and updated Current Outpatient Medications  Medication Sig Dispense Refill  . ASHWAGANDHA PO Take 1 capsule by mouth daily.    Marland Kitchen aspirin EC 81 MG tablet Take 81 mg by mouth daily.    Marland Kitchen b complex vitamins tablet Take 1 tablet by mouth daily.    Marland Kitchen BLACK ELDERBERRY,BERRY-FLOWER, PO Take 230 mLs by mouth daily.    . Calcium-Magnesium 333-167 MG TABS Take 1 tablet by mouth daily.    . Cyanocobalamin 1000 MCG CAPS  Take 1 capsule by mouth daily.    . Multiple Vitamins-Minerals (AIRBORNE) TBEF Take 1 tablet by mouth every 3 (three) days.    . Vitamin D, Ergocalciferol, (DRISDOL) 1.25 MG (50000 UNIT) CAPS capsule Take 1 capsule (50,000 Units total) by mouth every 7 (seven) days. 12 capsule 3  . zinc gluconate 50 MG tablet Take 50 mg by mouth daily.     No current facility-administered medications for this visit.    Allergies-reviewed and updated Allergies  Allergen Reactions  . Amoxicillin   . Penicillins Other (See Comments)    Childhood rxn Has patient had a PCN reaction causing immediate rash, facial/tongue/throat swelling, SOB or lightheadedness with hypotension: No Has patient had a PCN reaction causing severe rash involving mucus membranes or skin necrosis: No Has patient had a PCN reaction that required hospitalization No Has patient had a PCN reaction occurring within the last 10 years: No If all of the above answers are "NO", then may proceed with Cephalosporin use.     Social History   Socioeconomic History  . Marital status: Single    Spouse name: Not  on file  . Number of children: Not on file  . Years of education: Not on file  . Highest education level: Not on file  Occupational History  . Occupation: Nurse, learning disability  Tobacco Use  . Smoking status: Never Smoker  . Smokeless tobacco: Never Used  Substance and Sexual Activity  . Alcohol use: No  . Drug use: No  . Sexual activity: Yes    Partners: Male  Other Topics Concern  . Not on file  Social History Narrative   Lives in the [place] area. Single. No children. Wanted to have children, but suffered from fibroids and needed partial hysterectomy.        Social Determinants of Health   Financial Resource Strain:   . Difficulty of Paying Living Expenses:   Food Insecurity:   . Worried About Charity fundraiser in the Last Year:   . Arboriculturist in the Last Year:   Transportation Needs:   . Lexicographer (Medical):   Marland Kitchen Lack of Transportation (Non-Medical):   Physical Activity:   . Days of Exercise per Week:   . Minutes of Exercise per Session:   Stress:   . Feeling of Stress :   Social Connections:   . Frequency of Communication with Friends and Family:   . Frequency of Social Gatherings with Friends and Family:   . Attends Religious Services:   . Active Member of Clubs or Organizations:   . Attends Archivist Meetings:   Marland Kitchen Marital Status:         Objective:  Physical Exam: BP 129/79   Pulse 69   Temp 98.3 F (36.8 C)   Ht 5\' 5"  (1.651 m)   Wt 253 lb 3.2 oz (114.9 kg)   LMP 06/16/2015 (Exact Date)   SpO2 98%   BMI 42.13 kg/m   Body mass index is 42.13 kg/m. Wt Readings from Last 3 Encounters:  02/18/20 253 lb 3.2 oz (114.9 kg)  02/03/20 247 lb (112 kg)  11/18/19 242 lb (109.8 kg)   Gen: NAD, resting comfortably HEENT: TMs normal bilaterally. OP clear. No thyromegaly noted.  CV: RRR with no murmurs appreciated Pulm: NWOB, CTAB with no crackles, wheezes, or rhonchi GI: Normal bowel sounds present. Soft, Nontender, Nondistended. MSK: no edema, cyanosis, or clubbing noted Skin: warm, dry Neuro: CN2-12 grossly intact. Strength 5/5 in upper and lower extremities. Reflexes symmetric and intact bilaterally.  Psych: Normal affect and thought content     Aliz Meritt M. Jerline Pain, MD 02/18/2020 4:02 PM

## 2020-02-18 NOTE — Assessment & Plan Note (Signed)
She is working on weight loss

## 2020-02-18 NOTE — Assessment & Plan Note (Signed)
Possibly rhomboid strain.  Will place referral to physical therapy given the symptoms today going on for 4 years.

## 2020-02-18 NOTE — Assessment & Plan Note (Signed)
Working on [employer].

## 2020-02-18 NOTE — Assessment & Plan Note (Signed)
Patient deferred referral for sleep study at this time.  She is currently managing well.  No excessive daytime somnolence.

## 2020-02-18 NOTE — Patient Instructions (Signed)
It was very nice to see you today!  I will place a referral for you to see physical therapist and for the GI doctor.  Please work on the Kegel exercises.  We will start your vitamin D again.  We should recheck again in about 3 to 6 months.  I will see you back in 1 year for your annual physical.  Please come back to see me sooner if needed.  Take care, Dr Jerline Pain  Please try these tips to maintain a healthy lifestyle:   Eat at least 3 REAL meals and 1-2 snacks per day.  Aim for no more than 5 hours between eating.  If you eat breakfast, please do so within one hour of getting up.    Each meal should contain half fruits/vegetables, one quarter protein, and one quarter carbs (no bigger than a computer mouse)   Cut down on sweet beverages. This includes juice, soda, and sweet tea.     Drink at least 1 glass of water with each meal and aim for at least 8 glasses per day   Exercise at least 150 minutes every week.    Kegel Exercises  Kegel exercises can help strengthen your pelvic floor muscles. The pelvic floor is a group of muscles that support your rectum, small intestine, and bladder. In females, pelvic floor muscles also help support the womb (uterus). These muscles help you control the flow of urine and stool. Kegel exercises are painless and simple, and they do not require any equipment. Your provider may suggest Kegel exercises to:  Improve bladder and bowel control.  Improve sexual response.  Improve weak pelvic floor muscles after surgery to remove the uterus (hysterectomy) or pregnancy (females).  Improve weak pelvic floor muscles after prostate gland removal or surgery (males). Kegel exercises involve squeezing your pelvic floor muscles, which are the same muscles you squeeze when you try to stop the flow of urine or keep from passing gas. The exercises can be done while sitting, standing, or lying down, but it is best to vary your position. Exercises How to do  Kegel exercises: 1. Squeeze your pelvic floor muscles tight. You should feel a tight lift in your rectal area. If you are a female, you should also feel a tightness in your vaginal area. Keep your stomach, buttocks, and legs relaxed. 2. Hold the muscles tight for up to 10 seconds. 3. Breathe normally. 4. Relax your muscles. 5. Repeat as told by your health care provider. Repeat this exercise daily as told by your health care provider. Continue to do this exercise for at least 4-6 weeks, or for as long as told by your health care provider. You may be referred to a physical therapist who can help you learn more about how to do Kegel exercises. Depending on your condition, your health care provider may recommend:  Varying how long you squeeze your muscles.  Doing several sets of exercises every day.  Doing exercises for several weeks.  Making Kegel exercises a part of your regular exercise routine. This information is not intended to replace advice given to you by your health care provider. Make sure you discuss any questions you have with your health care provider. Document Revised: 03/14/2018 Document Reviewed: 03/14/2018 Elsevier Patient Education  2020 Elsevier Inc.   Preventive Care 50-28 Years Old, Female Preventive care refers to visits with your health care provider and lifestyle choices that can promote health and wellness. This includes:  A yearly physical exam. This may also  be called an annual well check.  Regular dental visits and eye exams.  Immunizations.  Screening for certain conditions.  Healthy lifestyle choices, such as eating a healthy diet, getting regular exercise, not using drugs or products that contain nicotine and tobacco, and limiting alcohol use. What can I expect for my preventive care visit? Physical exam Your health care provider will check your:  Height and weight. This may be used to calculate body mass index (BMI), which tells if you are at a  healthy weight.  Heart rate and blood pressure.  Skin for abnormal spots. Counseling Your health care provider may ask you questions about your:  Alcohol, tobacco, and drug use.  Emotional well-being.  Home and relationship well-being.  Sexual activity.  Eating habits.  Work and work Statistician.  Method of birth control.  Menstrual cycle.  Pregnancy history. What immunizations do I need?  Influenza (flu) vaccine  This is recommended every year. Tetanus, diphtheria, and pertussis (Tdap) vaccine  You may need a Td booster every 10 years. Varicella (chickenpox) vaccine  You may need this if you have not been vaccinated. Zoster (shingles) vaccine  You may need this after age 66. Measles, mumps, and rubella (MMR) vaccine  You may need at least one dose of MMR if you were born in 1957 or later. You may also need a second dose. Pneumococcal conjugate (PCV13) vaccine  You may need this if you have certain conditions and were not previously vaccinated. Pneumococcal polysaccharide (PPSV23) vaccine  You may need one or two doses if you smoke cigarettes or if you have certain conditions. Meningococcal conjugate (MenACWY) vaccine  You may need this if you have certain conditions. Hepatitis A vaccine  You may need this if you have certain conditions or if you travel or work in places where you may be exposed to hepatitis A. Hepatitis B vaccine  You may need this if you have certain conditions or if you travel or work in places where you may be exposed to hepatitis B. Haemophilus influenzae type b (Hib) vaccine  You may need this if you have certain conditions. Human papillomavirus (HPV) vaccine  If recommended by your health care provider, you may need three doses over 6 months. You may receive vaccines as individual doses or as more than one vaccine together in one shot (combination vaccines). Talk with your health care provider about the risks and benefits of  combination vaccines. What tests do I need? Blood tests  Lipid and cholesterol levels. These may be checked every 5 years, or more frequently if you are over 35 years old.  Hepatitis C test.  Hepatitis B test. Screening  Lung cancer screening. You may have this screening every year starting at age 81 if you have a 30-pack-year history of smoking and currently smoke or have quit within the past 15 years.  Colorectal cancer screening. All adults should have this screening starting at age 17 and continuing until age 90. Your health care provider may recommend screening at age 80 if you are at increased risk. You will have tests every 1-10 years, depending on your results and the type of screening test.  Diabetes screening. This is done by checking your blood sugar (glucose) after you have not eaten for a while (fasting). You may have this done every 1-3 years.  Mammogram. This may be done every 1-2 years. Talk with your health care provider about when you should start having regular mammograms. This may depend on whether you have  a family history of breast cancer.  BRCA-related cancer screening. This may be done if you have a family history of breast, ovarian, tubal, or peritoneal cancers.  Pelvic exam and Pap test. This may be done every 3 years starting at age 56. Starting at age 48, this may be done every 5 years if you have a Pap test in combination with an HPV test. Other tests  Sexually transmitted disease (STD) testing.  Bone density scan. This is done to screen for osteoporosis. You may have this scan if you are at high risk for osteoporosis. Follow these instructions at home: Eating and drinking  Eat a diet that includes fresh fruits and vegetables, whole grains, lean protein, and low-fat dairy.  Take vitamin and mineral supplements as recommended by your health care provider.  Do not drink alcohol if: ? Your health care provider tells you not to drink. ? You are pregnant,  may be pregnant, or are planning to become pregnant.  If you drink alcohol: ? Limit how much you have to 0-1 drink a day. ? Be aware of how much alcohol is in your drink. In the [place] one drink equals one 12 oz bottle of beer (355 mL), one 5 oz glass of wine (148 mL), or one 1 oz glass of hard liquor (44 mL). Lifestyle  Take daily care of your teeth and gums.  Stay active. Exercise for at least 30 minutes on 5 or more days each week.  Do not use any products that contain nicotine or tobacco, such as cigarettes, e-cigarettes, and chewing tobacco. If you need help quitting, ask your health care provider.  If you are sexually active, practice safe sex. Use a condom or other form of birth control (contraception) in order to prevent pregnancy and STIs (sexually transmitted infections).  If told by your health care provider, take low-dose aspirin daily starting at age 61. What's next?  Visit your health care provider once a year for a well check visit.  Ask your health care provider how often you should have your eyes and teeth checked.  Stay up to date on all vaccines. This information is not intended to replace advice given to you by your health care provider. Make sure you discuss any questions you have with your health care provider. Document Revised: 04/05/2018 Document Reviewed: 04/05/2018 Elsevier Patient Education  2020 [employer].

## 2020-02-18 NOTE — Assessment & Plan Note (Signed)
Continue management per weight management.

## 2020-02-19 ENCOUNTER — Encounter: Payer: Self-pay | Admitting: Family Medicine

## 2020-03-02 ENCOUNTER — Encounter (INDEPENDENT_AMBULATORY_CARE_PROVIDER_SITE_OTHER): Payer: Self-pay

## 2020-03-04 ENCOUNTER — Ambulatory Visit (INDEPENDENT_AMBULATORY_CARE_PROVIDER_SITE_OTHER): Payer: 59 | Admitting: Family Medicine

## 2020-03-17 ENCOUNTER — Other Ambulatory Visit: Payer: 59

## 2020-04-02 ENCOUNTER — Encounter: Payer: Self-pay | Admitting: Gastroenterology

## 2020-04-02 ENCOUNTER — Ambulatory Visit (INDEPENDENT_AMBULATORY_CARE_PROVIDER_SITE_OTHER): Payer: 59 | Admitting: Gastroenterology

## 2020-04-02 VITALS — BP 124/66 | HR 74 | Ht 64.0 in | Wt 249.0 lb

## 2020-04-02 DIAGNOSIS — Z1212 Encounter for screening for malignant neoplasm of rectum: Secondary | ICD-10-CM

## 2020-04-02 DIAGNOSIS — Z1211 Encounter for screening for malignant neoplasm of colon: Secondary | ICD-10-CM

## 2020-04-02 DIAGNOSIS — K5900 Constipation, unspecified: Secondary | ICD-10-CM

## 2020-04-02 DIAGNOSIS — R1310 Dysphagia, unspecified: Secondary | ICD-10-CM

## 2020-04-02 MED ORDER — NA SULFATE-K SULFATE-MG SULF 17.5-3.13-1.6 GM/177ML PO SOLN: 1.0000 | Freq: Once | ORAL | 0 refills | Status: AC

## 2020-04-02 NOTE — Patient Instructions (Signed)
Make sure your are taking Miralax and smooth move tea every day leading up to Colonoscopy.   Take your famotidine every day and not as needed.   You have been scheduled for an endoscopy and colonoscopy. Please follow the written instructions given to you at your visit today. Please pick up your prep supplies at the pharmacy within the next 1-3 days. If you use inhalers (even only as needed), please bring them with you on the day of your procedure.  Thank you for choosing me and [facility].  Pricilla Riffle. Dagoberto Ligas., MD., Mar[facility] Regal
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
## 2020-04-06 ENCOUNTER — Encounter (INDEPENDENT_AMBULATORY_CARE_PROVIDER_SITE_OTHER): Payer: Self-pay | Admitting: Family Medicine

## 2020-04-06 ENCOUNTER — Ambulatory Visit (INDEPENDENT_AMBULATORY_CARE_PROVIDER_SITE_OTHER): Payer: 59 | Admitting: Family Medicine

## 2020-04-06 ENCOUNTER — Other Ambulatory Visit: Payer: Self-pay

## 2020-04-06 VITALS — BP 111/62 | HR 60 | Temp 98.0°F | Ht 64.0 in | Wt 246.0 lb

## 2020-04-06 DIAGNOSIS — Z9189 Other specified personal risk factors, not elsewhere classified: Secondary | ICD-10-CM

## 2020-04-06 DIAGNOSIS — E559 Vitamin D deficiency, unspecified: Secondary | ICD-10-CM

## 2020-04-06 DIAGNOSIS — R1310 Dysphagia, unspecified: Secondary | ICD-10-CM | POA: Diagnosis not present

## 2020-04-06 DIAGNOSIS — E7849 Other hyperlipidemia: Secondary | ICD-10-CM | POA: Diagnosis not present

## 2020-04-06 DIAGNOSIS — Z6841 Body Mass Index (BMI) 40.0 and over, adult: Secondary | ICD-10-CM

## 2020-04-06 DIAGNOSIS — R7303 Prediabetes: Secondary | ICD-10-CM | POA: Diagnosis not present

## 2020-04-06 MED ORDER — METFORMIN HCL 500 MG PO TABS: 500.0000 mg | ORAL_TABLET | Freq: Every day | ORAL | 0 refills | Status: DC

## 2020-04-06 NOTE — Progress Notes (Signed)
Chief Complaint:   OBESITY [name] is here to discuss her progress with her obesity treatment plan along with follow-up of her obesity related diagnoses. Temara is on practicing portion control and making smarter food choices, such as increasing vegetables and decreasing simple carbohydrates and states she is following her eating plan approximately 0% of the time. Maloni states she is exercising for 0 minutes 0 times per week.  Today's visit was #: 9 Starting weight: 242 lbs Starting date: 06/25/2019 Today's weight: 246 lbs Today's date: 04/06/2020 Total lbs lost to date: 0 Total lbs lost since last in-office visit: 1 lb  Interim History:    She has seen her PCP, GYN, and GI since our last visit, which we reviewed.  She has an EGD scheduled for October 20. Celebrations:  Genna got a promotion at work and plans to move to [place] in December. Struggles:  She struggles with body image and fear of developing diabetes.    Assessment/Plan:   1. Dysphagia, to solids, now followed by Dr. Fuller Plan, with EGD planned [name] has an upcoming EGD scheduled for October 20.  2. Prediabetes Not at goal. Goal is HgbA1c < 5.7 and insulin level closer to 5. After discussion, patient would like to start below medication. Expectations, risks, and potential side effects reviewed.   - Start metFORMIN (GLUCOPHAGE) 500 MG tablet; Take 1 tablet (500 mg total) by mouth daily with breakfast.  Dispense: 30 tablet; Refill: 0  3. Other hyperlipidemia     Component Value Date/Time   CHOL 280 (H) 02/03/2020 1439   TRIG 96 02/03/2020 1439   HDL 44 02/03/2020 1439   VLDL 25.2 02/05/2018 0732   CHOLHDL 7 02/05/2018 0732   Lab Results  Component Value Date   LDLCALC 219 (H) 02/03/2020   Course: Uncontrolled.. Target levels for LDL are: < 130 mg/dl (2 or more risk factors are present). Plan: Dietary changes: Increase soluble fiber. Decrease simple carbohydrates. Exercise changes: An average 40 minutes of  moderate to vigorous-intensity aerobic activity 3 or 4 times per week. Lipid-lowering medications: declined.   4. Vitamin D deficiency Not at goal. Optimal goal > 50 ng/dL. There is also evidence to support a goal of >70 ng/dL in patients with cancer and heart disease. Plan: Continue Vitamin D @50 ,000 IU every week with follow-up for routine testing of Vitamin D at least 2-3 times per year to avoid over-replacement.  5. At risk for diabetes mellitus [name] was given approximately 15 minutes of diabetes education and counseling today. We discussed intensive lifestyle modifications today with an emphasis on weight loss as well as increasing exercise and decreasing simple carbohydrates in her diet. We also reviewed medication options with an emphasis on risk versus benefit of those discussed.   During insulin resistance, several metabolic alterations induce the development of cardiovascular disease. For instance, insulin resistance can induce an imbalance in glucose metabolism that generates chronic hyperglycemia, which in turn triggers oxidative stress and causes an inflammatory response that leads to cell damage. Insulin resistance can also alter systemic lipid metabolism which then leads to the development of dyslipidemia and the well-known lipid triad: (1) high levels of plasma triglycerides, (2) low levels of high-density lipoprotein, and (3) the appearance of small dense low-density lipoproteins. This triad, along with endothelial dysfunction, which can also be induced by aberrant insulin signaling, contribute to atherosclerotic plaque formation.   6. Class 3 severe obesity with serious comorbidity and body mass index (BMI) of 40.0 to 44.9 in adult,  unspecified obesity type [facility] Vani is currently in the action stage of change. As such, her goal is to continue with weight loss efforts. She has agreed to practicing portion control and making smarter food choices, such as increasing vegetables and  decreasing simple carbohydrates.   Exercise goals: For substantial health benefits, adults should do at least 150 minutes (2 hours and 30 minutes) a week of moderate-intensity, or 75 minutes (1 hour and 15 minutes) a week of vigorous-intensity aerobic physical activity, or an equivalent combination of moderate- and vigorous-intensity aerobic activity. Aerobic activity should be performed in episodes of at least 10 minutes, and preferably, it should be spread throughout the week.  Behavioral modification strategies: increasing lean protein intake.  [name] has agreed to follow-up with our clinic in 4 weeks. She was informed of the importance of frequent follow-up visits to maximize her success with intensive lifestyle modifications for her multiple health conditions.   Objective:   Blood pressure 111/62, pulse 60, temperature 98 F (36.7 C), temperature source Oral, height 5\' 4"  (1.626 m), weight 246 lb (111.6 kg), last menstrual period 06/16/2015, SpO2 100 %. Body mass index is 42.23 kg/m.  General: Cooperative, alert, well developed, in no acute distress. HEENT: Conjunctivae and lids unremarkable. Cardiovascular: Regular rhythm.  Lungs: Normal work of breathing. Neurologic: No focal deficits.   Lab Results  Component Value Date   CREATININE 0.89 02/03/2020   BUN 9 02/03/2020   NA 135 02/03/2020   K 4.7 02/03/2020   CL 100 02/03/2020   CO2 26 02/03/2020   Lab Results  Component Value Date   ALT 14 02/03/2020   AST 19 02/03/2020   ALKPHOS 77 02/03/2020   BILITOT 0.3 02/03/2020   Lab Results  Component Value Date   HGBA1C 6.1 (H) 02/03/2020   HGBA1C 6.2 (H) 09/04/2019   HGBA1C 5.7 (H) 06/25/2019   HGBA1C 5.7 (A) 02/05/2018   HGBA1C 6.1 11/06/2017   Lab Results  Component Value Date   INSULIN 20.3 09/04/2019   INSULIN 7.6 06/25/2019   Lab Results  Component Value Date   TSH 3.200 02/03/2020   Lab Results  Component Value Date   CHOL 280 (H) 02/03/2020   HDL 44  02/03/2020   LDLCALC 219 (H) 02/03/2020   TRIG 96 02/03/2020   CHOLHDL 7 02/05/2018   Lab Results  Component Value Date   WBC 4.3 02/03/2020   HGB 12.4 02/03/2020   HCT 38.4 02/03/2020   MCV 90 02/03/2020   PLT 345 02/03/2020   Lab Results  Component Value Date   IRON 111 02/03/2020   TIBC 333 02/03/2020   FERRITIN 96 02/03/2020   Attestation Statements:   Reviewed by clinician on day of visit: allergies, medications, problem list, medical history, surgical history, family history, social history, and previous encounter notes.  I, Water quality scientist, CMA, am acting as transcriptionist for Briscoe Deutscher, DO  I have reviewed the above documentation for accuracy and completeness, and I agree with the above. Briscoe Deutscher, DO

## 2020-04-27 ENCOUNTER — Other Ambulatory Visit: Payer: Self-pay | Admitting: Obstetrics and Gynecology

## 2020-04-27 DIAGNOSIS — Z1231 Encounter for screening mammogram for malignant neoplasm of breast: Secondary | ICD-10-CM

## 2020-05-01 ENCOUNTER — Ambulatory Visit
Admission: RE | Admit: 2020-05-01 | Discharge: 2020-05-01 | Disposition: A | Discharge status: Home / Self Care | Payer: 59 | Source: Ambulatory Visit | Attending: Obstetrics and Gynecology | Admitting: Obstetrics and Gynecology

## 2020-05-01 ENCOUNTER — Other Ambulatory Visit: Payer: Self-pay

## 2020-05-01 DIAGNOSIS — Z1231 Encounter for screening mammogram for malignant neoplasm of breast: Secondary | ICD-10-CM

## 2020-05-04 ENCOUNTER — Encounter (INDEPENDENT_AMBULATORY_CARE_PROVIDER_SITE_OTHER): Payer: Self-pay | Admitting: Family Medicine

## 2020-05-04 ENCOUNTER — Ambulatory Visit (INDEPENDENT_AMBULATORY_CARE_PROVIDER_SITE_OTHER): Payer: 59 | Admitting: Family Medicine

## 2020-05-04 ENCOUNTER — Other Ambulatory Visit: Payer: Self-pay

## 2020-05-04 VITALS — BP 129/78 | HR 64 | Temp 98.4°F | Ht 64.0 in | Wt 246.0 lb

## 2020-05-04 DIAGNOSIS — E7800 Pure hypercholesterolemia, unspecified: Secondary | ICD-10-CM | POA: Diagnosis not present

## 2020-05-04 DIAGNOSIS — Z6841 Body Mass Index (BMI) 40.0 and over, adult: Secondary | ICD-10-CM

## 2020-05-04 DIAGNOSIS — R5383 Other fatigue: Secondary | ICD-10-CM

## 2020-05-04 DIAGNOSIS — R7303 Prediabetes: Secondary | ICD-10-CM | POA: Diagnosis not present

## 2020-05-04 DIAGNOSIS — E559 Vitamin D deficiency, unspecified: Secondary | ICD-10-CM | POA: Diagnosis not present

## 2020-05-04 DIAGNOSIS — Z9189 Other specified personal risk factors, not elsewhere classified: Secondary | ICD-10-CM

## 2020-05-04 MED ORDER — METFORMIN HCL 500 MG PO TABS: 500.0000 mg | ORAL_TABLET | Freq: Two times a day (BID) | ORAL | 0 refills | Status: DC

## 2020-05-04 MED ORDER — VITAMIN D (ERGOCALCIFEROL) 1.25 MG (50000 UNIT) PO CAPS: 50000.0000 [IU] | ORAL_CAPSULE | ORAL | 0 refills | Status: DC

## 2020-05-05 LAB — COMPREHENSIVE METABOLIC PANEL
ALT: 11 IU/L (ref 0–32)
AST: 18 IU/L (ref 0–40)
Albumin/Globulin Ratio: 1.2 (ref 1.2–2.2)
Albumin: 4 g/dL (ref 3.8–4.8)
Alkaline Phosphatase: 73 IU/L (ref 44–121)
BUN/Creatinine Ratio: 12 (ref 9–23)
BUN: 10 mg/dL (ref 6–24)
Bilirubin Total: <0.2 mg/dL (ref 0.0–1.2)
CO2: 29 mmol/L (ref 20–29)
Calcium: 9.3 mg/dL (ref 8.7–10.2)
Chloride: 101 mmol/L (ref 96–106)
Creatinine, Ser: 0.83 mg/dL (ref 0.57–1.00)
GFR calc Af Amer: 95 mL/min/{1.73_m2} (ref >59)
GFR calc non Af Amer: 82 mL/min/{1.73_m2} (ref >59)
Globulin, Total: 3.4 g/dL (ref 1.5–4.5)
Glucose: 95 mg/dL (ref 65–99)
Potassium: 4.5 mmol/L (ref 3.5–5.2)
Sodium: 141 mmol/L (ref 134–144)
Total Protein: 7.4 g/dL (ref 6.0–8.5)

## 2020-05-05 LAB — CBC WITH DIFFERENTIAL/PLATELET
Basophils Absolute: 0 10*3/uL (ref 0.0–0.2)
Basos: 1 %
EOS (ABSOLUTE): 0.2 10*3/uL (ref 0.0–0.4)
Eos: 4 %
Hemoglobin: 12.8 g/dL (ref 11.1–15.9)
Immature Grans (Abs): 0 10*3/uL (ref 0.0–0.1)
Immature Granulocytes: 0 %
Lymphocytes Absolute: 1.6 10*3/uL (ref 0.7–3.1)
Lymphs: 39 %
MCH: 29.2 pg (ref 26.6–33.0)
MCHC: 32.6 g/dL (ref 31.5–35.7)
MCV: 90 fL (ref 79–97)
Monocytes Absolute: 0.5 10*3/uL (ref 0.1–0.9)
Monocytes: 13 %
Neutrophils Absolute: 1.8 10*3/uL (ref 1.4–7.0)
Neutrophils: 43 %
Platelets: 355 10*3/uL (ref 150–450)
RBC: 4.39 x10E6/uL (ref 3.77–5.28)
RDW: 13.2 % (ref 11.7–15.4)
WBC: 4.1 10*3/uL (ref 3.4–10.8)

## 2020-05-05 LAB — HEMOGLOBIN A1C
Est. average glucose Bld gHb Est-mCnc: 126 mg/dL
Hgb A1c MFr Bld: 6 % — ABNORMAL HIGH (ref 4.8–5.6)

## 2020-05-05 LAB — ANEMIA PANEL
Ferritin: 77 ng/mL (ref 15–150)
Folate, Hemolysate: 351 ng/mL
Folate, RBC: 893 ng/mL (ref >498)
Hematocrit: 39.3 % (ref 34.0–46.6)
Iron Saturation: 19 % (ref 15–55)
Iron: 60 ug/dL (ref 27–159)
Retic Ct Pct: 1 % (ref 0.6–2.6)
Total Iron Binding Capacity: 323 ug/dL (ref 250–450)
UIBC: 263 ug/dL (ref 131–425)
Vitamin B-12: 1175 pg/mL (ref 232–1245)

## 2020-05-05 LAB — VITAMIN D 25 HYDROXY (VIT D DEFICIENCY, FRACTURES): Vit D, 25-Hydroxy: 25.5 ng/mL — ABNORMAL LOW (ref 30.0–100.0)

## 2020-05-05 LAB — LIPID PANEL WITH LDL/HDL RATIO
Cholesterol, Total: 298 mg/dL — ABNORMAL HIGH (ref 100–199)
HDL: 39 mg/dL — ABNORMAL LOW (ref >39)
LDL Chol Calc (NIH): 233 mg/dL — ABNORMAL HIGH (ref 0–99)
LDL/HDL Ratio: 6 ratio — ABNORMAL HIGH (ref 0.0–3.2)
Triglycerides: 137 mg/dL (ref 0–149)
VLDL Cholesterol Cal: 26 mg/dL (ref 5–40)

## 2020-05-05 NOTE — Progress Notes (Signed)
Chief Complaint:   OBESITY [name] is here to discuss her progress with her obesity treatment plan along with follow-up of her obesity related diagnoses. [name] is on practicing portion control and making smarter food choices, such as increasing vegetables and decreasing simple carbohydrates and states she is following her eating plan approximately 98% of the time. [name] states she is doing cardio for 90 minutes 2 times per week.  Today's visit was #: 10 Starting weight: 242 lbs Starting date: 06/25/2019 Today's weight: 246 lbs Today's date: 05/04/2020 Total lbs lost to date: 0 Total lbs lost since last in-office visit: 0  Interim History: [name] is turing 49 today!  She says she is focusing on gratitude and self acceptance.  She is getting in >100 grams of protein per day.  She wants to increase her exercise.  She is tolerating metformin.  Assessment/Plan:   1. Vitamin D deficiency Current vitamin D is 25.8, tested on 02/03/2020. Not at goal. Optimal goal > 50 ng/dL. There is also evidence to support a goal of >70 ng/dL in patients with cancer and heart disease. Plan: Continue Vitamin D @50 ,000 IU every week with follow-up for routine testing of Vitamin D at least 2-3 times per year to avoid over-replacement.  -Refill Vitamin D, Ergocalciferol, (DRISDOL) 1.25 MG (50000 UNIT) CAPS capsule; Take 1 capsule (50,000 Units total) by mouth every 7 (seven) days.  Dispense: 4 capsule; Refill: 0 - VITAMIN D 25 Hydroxy (Vit-D Deficiency, Fractures)  2. Prediabetes Started metformin at last visit.  Will increase to twice daily dosing.  Check labs today.  -Increase metFORMIN (GLUCOPHAGE) 500 MG tablet; Take 1 tablet (500 mg total) by mouth 2 (two) times daily with a meal.  Dispense: 60 tablet; Refill: 0 - Comprehensive metabolic panel - Hemoglobin A1c  3. Pure hypercholesterolemia Cardiovascular risk and specific lipid/LDL goals reviewed.  We discussed several lifestyle modifications today  and [name] will continue to work on diet, exercise and weight loss efforts. Orders and follow up as documented in patient record.   Counseling Intensive lifestyle modifications are the first line treatment for this issue. . Dietary changes: Increase soluble fiber. Decrease simple carbohydrates. . Exercise changes: Moderate to vigorous-intensity aerobic activity 150 minutes per week if tolerated. . Lipid-lowering medications: see documented in medical record.  Lab Results  Component Value Date   ALT 11 05/04/2020   AST 18 05/04/2020   ALKPHOS 73 05/04/2020   BILITOT <0.2 05/04/2020   Lab Results  Component Value Date   CHOL 298 (H) 05/04/2020   HDL 39 (L) 05/04/2020   LDLCALC 233 (H) 05/04/2020   TRIG 137 05/04/2020   CHOLHDL 7 02/05/2018   - CBC with Differential/Platelet - Lipid Panel With LDL/HDL Ratio  4. Other fatigue Will check anemia panel today. - Anemia panel  5. At risk for diarrhea [name] was given approximately 15 minutes of diarrhea prevention counseling today. She is 49 y.o. female and has risk factors for diarrhea increasing her metformin dose. We discussed intensive lifestyle modifications today with an emphasis on specific weight loss instructions including dietary strategies.   Repetitive spaced learning was employed today to elicit superior memory formation and behavioral change.  6. Class 3 severe obesity with serious comorbidity and body mass index (BMI) of 40.0 to 44.9 in adult, unspecified obesity type [facility])  [name] is currently in the action stage of change. As such, her goal is to continue with weight loss efforts. She has agreed to practicing portion control and making  smarter food choices, such as increasing vegetables and decreasing simple carbohydrates.   Exercise goals: For substantial health benefits, adults should do at least 150 minutes (2 hours and 30 minutes) a week of moderate-intensity, or 75 minutes (1 hour and 15 minutes) a week of  vigorous-intensity aerobic physical activity, or an equivalent combination of moderate- and vigorous-intensity aerobic activity. Aerobic activity should be performed in episodes of at least 10 minutes, and preferably, it should be spread throughout the week.  Behavioral modification strategies: increasing water intake.  [name] has agreed to follow-up with our clinic in 2-3 weeks. She was informed of the importance of frequent follow-up visits to maximize her success with intensive lifestyle modifications for her multiple health conditions.   [name] was informed we would discuss her lab results at her next visit unless there is a critical issue that needs to be addressed sooner. [name] agreed to keep her next visit at the agreed upon time to discuss these results.  Objective:   Blood pressure 129/78, pulse 64, temperature 98.4 F (36.9 C), temperature source Oral, height 5\' 4"  (1.626 m), weight 246 lb (111.6 kg), last menstrual period 06/16/2015, SpO2 99 %. Body mass index is 42.23 kg/m.  General: Cooperative, alert, well developed, in no acute distress. HEENT: Conjunctivae and lids unremarkable. Cardiovascular: Regular rhythm.  Lungs: Normal work of breathing. Neurologic: No focal deficits.   Lab Results  Component Value Date   CREATININE 0.83 05/04/2020   BUN 10 05/04/2020   NA 141 05/04/2020   K 4.5 05/04/2020   CL 101 05/04/2020   CO2 29 05/04/2020   Lab Results  Component Value Date   ALT 11 05/04/2020   AST 18 05/04/2020   ALKPHOS 73 05/04/2020   BILITOT <0.2 05/04/2020   Lab Results  Component Value Date   HGBA1C 6.0 (H) 05/04/2020   HGBA1C 6.1 (H) 02/03/2020   HGBA1C 6.2 (H) 09/04/2019   HGBA1C 5.7 (H) 06/25/2019   HGBA1C 5.7 (A) 02/05/2018   Lab Results  Component Value Date   INSULIN 20.3 09/04/2019   INSULIN 7.6 06/25/2019   Lab Results  Component Value Date   TSH 3.200 02/03/2020   Lab Results  Component Value Date   CHOL 298 (H) 05/04/2020   HDL 39  (L) 05/04/2020   LDLCALC 233 (H) 05/04/2020   TRIG 137 05/04/2020   CHOLHDL 7 02/05/2018   Lab Results  Component Value Date   WBC 4.1 05/04/2020   HGB 12.8 05/04/2020   HCT 39.3 05/04/2020   MCV 90 05/04/2020   PLT 355 05/04/2020   Lab Results  Component Value Date   IRON 60 05/04/2020   TIBC 323 05/04/2020   FERRITIN 77 05/04/2020   Attestation Statements:   Reviewed by clinician on day of visit: allergies, medications, problem list, medical history, surgical history, family history, social history, and previous encounter notes.  I, Water quality scientist, CMA, am acting as transcriptionist for Briscoe Deutscher, DO  I have reviewed the above documentation for accuracy and completeness, and I agree with the above. Briscoe Deutscher, DO

## 2020-05-11 ENCOUNTER — Encounter: Payer: Self-pay | Admitting: Physical Therapy

## 2020-05-11 ENCOUNTER — Other Ambulatory Visit: Payer: Self-pay

## 2020-05-11 ENCOUNTER — Ambulatory Visit (INDEPENDENT_AMBULATORY_CARE_PROVIDER_SITE_OTHER): Payer: 59 | Admitting: Physical Therapy

## 2020-05-11 DIAGNOSIS — G8929 Other chronic pain: Secondary | ICD-10-CM

## 2020-05-11 DIAGNOSIS — M25512 Pain in left shoulder: Secondary | ICD-10-CM
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
## 2020-05-11 NOTE — Patient Instructions (Signed)
Access Code: [email or web address] URL: [email or web address] Date: 05/11/2020 Prepared by: Lyndee Hensen  Exercises Prone Scapular Retraction Arms at Side - 1 x daily - 2 sets - 10 reps Prone Scapular Slide with Shoulder Extension - 1 x daily - 2 sets - 10 reps Standing Row with Anchored Resistance - 2 x daily - 2 sets - 10 reps Standing Shoulder Posterior Capsule Stretch - 2 x daily - 3 reps - 30 hold Sidelying Hip Abduction - 1 x daily - 2 sets - 10 reps Clamshell - 1 x daily - 2 sets - 10 reps Supine Piriformis Stretch with Leg Straight - 2 x daily - 3 reps - 30 hold Supine Piriformis Stretch Pulling Heel to Hip - 2 x daily - 2 sets - 10 reps

## 2020-05-22 ENCOUNTER — Encounter: Payer: Self-pay | Admitting: Gastroenterology

## 2020-05-25 ENCOUNTER — Encounter: Payer: Self-pay | Admitting: Physical Therapy

## 2020-05-25 ENCOUNTER — Ambulatory Visit (INDEPENDENT_AMBULATORY_CARE_PROVIDER_SITE_OTHER): Payer: 59 | Admitting: Physical Therapy

## 2020-05-25 ENCOUNTER — Other Ambulatory Visit: Payer: Self-pay

## 2020-05-25 DIAGNOSIS — M25512 Pain in left shoulder: Secondary | ICD-10-CM

## 2020-05-25 DIAGNOSIS — G8929 Other chronic pain: Secondary | ICD-10-CM
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
## 2020-05-27 ENCOUNTER — Ambulatory Visit (AMBULATORY_SURGERY_CENTER): Payer: 59 | Admitting: Gastroenterology

## 2020-05-27 ENCOUNTER — Other Ambulatory Visit: Payer: Self-pay

## 2020-05-27 ENCOUNTER — Encounter: Payer: Self-pay | Admitting: Gastroenterology

## 2020-05-27 VITALS — BP 130/83 | HR 67 | Temp 97.9°F | Resp 20 | Ht 64.0 in | Wt 249.0 lb

## 2020-05-27 DIAGNOSIS — K388 Other specified diseases of appendix: Secondary | ICD-10-CM | POA: Diagnosis not present

## 2020-05-27 DIAGNOSIS — K635 Polyp of colon: Secondary | ICD-10-CM | POA: Diagnosis not present

## 2020-05-27 DIAGNOSIS — K2960 Other gastritis without bleeding: Secondary | ICD-10-CM | POA: Diagnosis not present

## 2020-05-27 DIAGNOSIS — K2950 Unspecified chronic gastritis without bleeding: Secondary | ICD-10-CM | POA: Diagnosis not present

## 2020-05-27 DIAGNOSIS — R1310 Dysphagia, unspecified: Secondary | ICD-10-CM | POA: Diagnosis not present

## 2020-05-27 DIAGNOSIS — Z1211 Encounter for screening for malignant neoplasm of colon: Secondary | ICD-10-CM

## 2020-05-27 MED ORDER — SODIUM CHLORIDE 0.9 % IV SOLN: 500.0000 mL | Freq: Once | INTRAVENOUS | Status: DC

## 2020-05-27 NOTE — Op Note (Signed)
[place] Patient Name: [name] Procedure Date: 05/27/2020 1:31 PM MRN: [id number] Endoscopist: Ladene Artist , MD Age: 49 Referring MD:  Date of Birth: 06-18-1971 Gender: Female Account #: [id number] Procedure:                Colonoscopy Indications:              Screening for colorectal malignant neoplasm Medicines:                Monitored Anesthesia Care Procedure:                Pre-Anesthesia Assessment:                           - Prior to the procedure, a History and Physical                            was performed, and patient medications and                            allergies were reviewed. The patient's tolerance of                            previous anesthesia was also reviewed. The risks                            and benefits of the procedure and the sedation                            options and risks were discussed with the patient.                            All questions were answered, and informed consent                            was obtained. Prior Anticoagulants: The patient has                            taken no previous anticoagulant or antiplatelet                            agents. ASA Grade Assessment: III - A patient with                            severe systemic disease. After reviewing the risks                            and benefits, the patient was deemed in                            satisfactory condition to undergo the procedure.                           After obtaining informed consent, the colonoscope  was passed under direct vision. Throughout the                            procedure, the patient's blood pressure, pulse, and                            oxygen saturations were monitored continuously. The                            Colonoscope was introduced through the anus and                            advanced to the the cecum, identified by                            appendiceal orifice  and ileocecal valve. The                            ileocecal valve, appendiceal orifice, and rectum                            were photographed. The quality of the bowel                            preparation was good. The colonoscopy was performed                            without difficulty. The patient tolerated the                            procedure well. Scope In: 1:36:33 PM Scope Out: 1:49:37 PM Scope Withdrawal Time: 0 hours 9 minutes 53 seconds  Total Procedure Duration: 0 hours 13 minutes 4 seconds  Findings:                 The perianal and digital rectal examinations were                            normal.                           Localized mild mucosal changes characterized by                            erosions and granularity were found in the                            recto-sigmoid colon and at the appendiceal orifice.                            Biopsies were taken with a cold forceps for                            histology.  Internal hemorrhoids were found during                            retroflexion. The hemorrhoids were small and Grade                            I (internal hemorrhoids that do not prolapse).                           The exam was otherwise without abnormality on                            direct and retroflexion views. Complications:            No immediate complications. Estimated blood loss:                            None. Estimated Blood Loss:     Estimated blood loss: none. Impression:               - Localized mild mucosal changes were found in the                            recto-sigmoid colon and at the appendiceal orifice,                            secondary to colitis and rule out inflammatory                            bowel disease. Biopsied.                           - Internal hemorrhoids.                           - The examination was otherwise normal on direct                            and  retroflexion views. Recommendation:           - Repeat colonoscopy after studies are complete for                            surveillance based on pathology results.                           - Patient has a contact number available for                            emergencies. The signs and symptoms of potential                            delayed complications were discussed with the                            patient. Return to normal activities tomorrow.  Written discharge instructions were provided to the                            patient.                           - Resume previous diet.                           - Continue present medications.                           - Await pathology results. Ladene Artist, MD 05/27/2020 2:15:19 PM This report has been signed electronically.

## 2020-05-27 NOTE — Progress Notes (Signed)
Called to room to assist during endoscopic procedure.  Patient ID and intended procedure confirmed with present staff. Received instructions for my participation in the procedure from the performing physician.  

## 2020-05-27 NOTE — Progress Notes (Signed)
A and O x3. Report to RN. Tolerated MAC anesthesia well.

## 2020-05-27 NOTE — Progress Notes (Signed)
Vs by S Monday,RN in adm

## 2020-05-27 NOTE — Patient Instructions (Signed)
Clear liquid diet for the first 2 hours post procedure today - until 3:00pm.  Soft diet the rest of today. You may resume your regular diet tomorrow.    Await pathology results.  Resume previous medications.   YOU HAD AN ENDOSCOPIC PROCEDURE TODAY AT [facility]:   Refer to the procedure report that was given to you for any specific questions about what was found during the examination.  If the procedure report does not answer your questions, please call your gastroenterologist to clarify.  If you requested that your care partner not be given the details of your procedure findings, then the procedure report has been included in a sealed envelope for you to review at your convenience later.  YOU SHOULD EXPECT: Some feelings of bloating in the abdomen. Passage of more gas than usual.  Walking can help get rid of the air that was put into your GI tract during the procedure and reduce the bloating. If you had a lower endoscopy (such as a colonoscopy or flexible sigmoidoscopy) you may notice spotting of blood in your stool or on the toilet paper. If you underwent a bowel prep for your procedure, you may not have a normal bowel movement for a few days.  Please Note:  You might notice some irritation and congestion in your nose or some drainage.  This is from the oxygen used during your procedure.  There is no need for concern and it should clear up in a day or so.  SYMPTOMS TO REPORT IMMEDIATELY:   Following lower endoscopy (colonoscopy or flexible sigmoidoscopy):  Excessive amounts of blood in the stool  Significant tenderness or worsening of abdominal pains  Swelling of the abdomen that is new, acute  Fever of 100F or higher   Following upper endoscopy (EGD)  Vomiting of blood or coffee ground material  New chest pain or pain under the shoulder blades  Painful or persistently difficult swallowing  New shortness of breath  Fever of 100F or higher  Black, tarry-looking  stools  For urgent or emergent issues, a gastroenterologist can be reached at any hour by calling [phone]. Do not use MyChart messaging for urgent concerns.    DIET:  We do recommend a small meal at first, but then you may proceed to your regular diet.  Drink plenty of fluids but you should avoid alcoholic beverages for 24 hours.  ACTIVITY:  You should plan to take it easy for the rest of today and you should NOT DRIVE or use heavy machinery until tomorrow (because of the sedation medicines used during the test).    FOLLOW UP: Our staff will call the number listed on your records 48-72 hours following your procedure to check on you and address any questions or concerns that you may have regarding the information given to you following your procedure. If we do not reach you, we will leave a message.  We will attempt to reach you two times.  During this call, we will ask if you have developed any symptoms of COVID 19. If you develop any symptoms (ie: fever, flu-like symptoms, shortness of breath, cough etc.) before then, please call [phone].  If you test positive for Covid 19 in the 2 weeks post procedure, please call and report this information to [place].    If any biopsies were taken you will be contacted by phone or by letter within the next 1-3 weeks.  Please call us at [phone] if you have not heard  about the biopsies in 3 weeks.    SIGNATURES/CONFIDENTIALITY: You and/or your care partner have signed paperwork which will be entered into your electronic medical record.  These signatures attest to the fact that that the information above on your After Visit Summary has been reviewed and is understood.  Full responsibility of the confidentiality of this discharge information lies with you and/or your care-partner.

## 2020-05-27 NOTE — Op Note (Signed)
[place] Patient Name: [name] Procedure Date: 05/27/2020 1:31 PM MRN: [id number] Endoscopist: Ladene Artist , MD Age: 49 Referring MD:  Date of Birth: [birth year] Gender: Female Account #: [id number] Procedure:                Upper GI endoscopy Indications:              Dysphagia Medicines:                Monitored Anesthesia Care Procedure:                Pre-Anesthesia Assessment:                           - Prior to the procedure, a History and Physical                            was performed, and patient medications and                            allergies were reviewed. The patient's tolerance of                            previous anesthesia was also reviewed. The risks                            and benefits of the procedure and the sedation                            options and risks were discussed with the patient.                            All questions were answered, and informed consent                            was obtained. Prior Anticoagulants: The patient has                            taken no previous anticoagulant or antiplatelet                            agents. ASA Grade Assessment: III - A patient with                            severe systemic disease. After reviewing the risks                            and benefits, the patient was deemed in                            satisfactory condition to undergo the procedure.                           After obtaining informed consent, the endoscope was  passed under direct vision. Throughout the                            procedure, the patient's blood pressure, pulse, and                            oxygen saturations were monitored continuously. The                            Endoscope was introduced through the mouth, and                            advanced to the second part of duodenum. The upper                            GI endoscopy was accomplished without  difficulty.                            The patient tolerated the procedure well. Scope In: Scope Out: Findings:                 No endoscopic abnormality was evident in the                            esophagus to explain the patient's complaint of                            dysphagia. It was decided, however, to proceed with                            dilation of the entire esophagus. A guidewire was                            placed and the scope was withdrawn. Dilation was                            performed with a Savary dilator with no resistance                            at 16 mm.                           A few localized small erosions with no bleeding and                            no stigmata of recent bleeding were found in the                            gastric antrum. Biopsies were taken with a cold                            forceps for histology.  The exam of the stomach was otherwise normal.                           The duodenal bulb and second portion of the                            duodenum were normal. Complications:            No immediate complications. Estimated Blood Loss:     Estimated blood loss was minimal. Impression:               - No endoscopic esophageal abnormality to explain                            patient's dysphagia. Esophagus dilated.                           - Erosive gastropathy with no bleeding and no                            stigmata of recent bleeding. Biopsied.                           - Normal duodenal bulb and second portion of the                            duodenum. Recommendation:           - Patient has a contact number available for                            emergencies. The signs and symptoms of potential                            delayed complications were discussed with the                            patient. Return to normal activities tomorrow.                            Written discharge  instructions were provided to the                            patient.                           - Clear liquid diet for 2 hours, then advance as                            tolerated to soft diet today.                           - Resume prior diet tomorrow.                           - Continue present medications.                           -  Await pathology results.                           - Return to GI office in 6 weeks. Ladene Artist, MD 05/27/2020 2:18:28 PM This report has been signed electronically.

## 2020-05-29 ENCOUNTER — Telehealth: Payer: Self-pay

## 2020-05-29 NOTE — Telephone Encounter (Signed)
°  Follow up Call-  Call back number 05/27/2020  Post procedure Call Back phone  # [phone]-  Permission to leave phone message Yes  Some recent data might be hidden     Patient questions:  Do you have a fever, pain , or abdominal swelling? No. Pain Score  0 *  Have you tolerated food without any problems? Yes.    Have you been able to return to your normal activities? Yes.    Do you have any questions about your discharge instructions: Diet   No. Medications  No. Follow up visit  No.  Do you have questions or concerns about your Care? No.  Actions: * If pain score is 4 or above: No action needed, pain <4.  1. Have you developed a fever since your procedure? no  2.   Have you had an respiratory symptoms (SOB or cough) since your procedure? no  3.   Have you tested positive for COVID 19 since your procedure no  4.   Have you had any family members/close contacts diagnosed with the COVID 19 since your procedure?  no   If yes to any of these questions please route to Joylene John, RN and Joella Prince, RN

## 2020-06-01 ENCOUNTER — Encounter: Payer: 59 | Admitting: Physical Therapy

## 2020-06-03 ENCOUNTER — Encounter: Payer: Self-pay | Admitting: Gastroenterology

## 2020-06-04 ENCOUNTER — Encounter (INDEPENDENT_AMBULATORY_CARE_PROVIDER_SITE_OTHER): Payer: Self-pay | Admitting: Family Medicine

## 2020-06-04 ENCOUNTER — Other Ambulatory Visit: Payer: Self-pay

## 2020-06-04 ENCOUNTER — Ambulatory Visit (INDEPENDENT_AMBULATORY_CARE_PROVIDER_SITE_OTHER): Payer: 59 | Admitting: Family Medicine

## 2020-06-04 VITALS — BP 114/76 | HR 82 | Temp 97.9°F | Ht 64.0 in | Wt 243.0 lb

## 2020-06-04 DIAGNOSIS — E66813 Obesity, class 3: Secondary | ICD-10-CM

## 2020-06-04 DIAGNOSIS — E559 Vitamin D deficiency, unspecified: Secondary | ICD-10-CM

## 2020-06-04 DIAGNOSIS — R7303 Prediabetes: Secondary | ICD-10-CM

## 2020-06-04 DIAGNOSIS — Z6841 Body Mass Index (BMI) 40.0 and over, adult: Secondary | ICD-10-CM

## 2020-06-04 DIAGNOSIS — E782 Mixed hyperlipidemia: Secondary | ICD-10-CM

## 2020-06-04 MED ORDER — VITAMIN D (ERGOCALCIFEROL) 1.25 MG (50000 UNIT) PO CAPS: 50000.0000 [IU] | ORAL_CAPSULE | ORAL | 0 refills | Status: DC

## 2020-06-04 NOTE — Progress Notes (Signed)
Chief Complaint:   OBESITY [name] is here to discuss her progress with her obesity treatment plan along with follow-up of her obesity related diagnoses.   Interim History: [name] tried Metformin after our last visit. She reports that it made her feel sick when she took it. The feeling stopped after she discontinued it. She did start taking chicory root, advised by her mother, and feels that it has helped with bowels and appetite. She would like to work on controlling her obesity-related diagnoses using exercise, diet changes, and supplements only. She has also started IF, with eating window noon to dinner.  Assessment/Plan:   1. Vitamin D deficiency Not optimized. Optimal goal > 50 ng/dL.   Plan:  [x]   Continue Vitamin D @50 ,000 IU every week. []   Continue home supplement daily. [x]   Follow-up for routine testing of Vitamin D at least 2-3 times per year to avoid over-replacement. []   Monitored by PCP.  - Vitamin D, Ergocalciferol, (DRISDOL) 1.25 MG (50000 UNIT) CAPS capsule; Take 1 capsule (50,000 Units total) by mouth every 7 (seven) days.  Dispense: 4 capsule; Refill: 0  2. Prediabetes Not at goal. Goal is HgbA1c < 5.7.  Medication: None.  She will continue to focus on protein-rich, low simple carbohydrate foods. We reviewed the importance of hydration, regular exercise for stress reduction, and restorative sleep.   Lab Results  Component Value Date   HGBA1C 6.0 (H) 05/04/2020   Lab Results  Component Value Date   INSULIN 20.3 09/04/2019   INSULIN 7.6 06/25/2019   3. Mixed hyperlipidemia Course: Not at goal. Target levels for LDL are: < 130. Plan: Dietary changes: Increase soluble fiber. Decrease simple carbohydrates. Exercise changes: An average 40 minutes of moderate to vigorous-intensity aerobic activity 3 or 4 times per week. Lipid-lowering medications: patient declines. Note: We also discussed fiber intake, plant stanols, omega 3 fatty acids, niacin.   Lab Results    Component Value Date   CHOL 298 (H) 05/04/2020   HDL 39 (L) 05/04/2020   LDLCALC 233 (H) 05/04/2020   TRIG 137 05/04/2020   CHOLHDL 7 02/05/2018   Lab Results  Component Value Date   ALT 11 05/04/2020   AST 18 05/04/2020   ALKPHOS 73 05/04/2020   BILITOT <0.2 05/04/2020   The 10-year ASCVD risk score Mikey Bussing DC Jr., et al., 2013) is: 2.9%   Values used to calculate the score:     Age: 49 years     Sex: Female     Is Non-Hispanic African American: Yes     Diabetic: No     Tobacco smoker: No     Systolic Blood Pressure: 481 mmHg     Is BP treated: No     HDL Cholesterol: 39 mg/dL     Total Cholesterol: 298 mg/dL  4. Class 3 severe obesity with serious comorbidity and body mass index (BMI) of 40.0 to 44.9 in adult, unspecified obesity type [name])  [name] is currently in the action stage of change. As such, her goal is to continue with weight loss efforts.   Nutrition goals: She has agreed to following a lower carbohydrate, vegetable and lean protein rich diet plan.   Exercise goals: For substantial health benefits, adults should do at least 150 minutes (2 hours and 30 minutes) a week of moderate-intensity, or 75 minutes (1 hour and 15 minutes) a week of vigorous-intensity aerobic physical activity, or an equivalent combination of moderate- and vigorous-intensity aerobic activity. Aerobic activity should be performed  in episodes of at least 10 minutes, and preferably, it should be spread throughout the week. Adults should also include muscle-strengthening activities that involve all major muscle groups on 2 or more days a week.  Behavioral modification strategies: increasing lean protein intake, increasing water intake and meal planning and cooking strategies.  [name] has agreed to follow-up with our clinic in 3 weeks. She was informed of the importance of frequent follow-up visits to maximize her success with intensive lifestyle modifications for her multiple health conditions.    Objective:   Blood pressure 114/76, pulse 82, temperature 97.9 F (36.6 C), temperature source Oral, height 5\' 4"  (1.626 m), weight 243 lb (110.2 kg), last menstrual period 06/16/2015, SpO2 99 %. Body mass index is 41.71 kg/m.  General: Cooperative, alert, well developed, in no acute distress. HEENT: Conjunctivae and lids unremarkable. Cardiovascular: Regular rhythm.  Lungs: Normal work of breathing. Neurologic: No focal deficits.   Lab Results  Component Value Date   CREATININE 0.83 05/04/2020   BUN 10 05/04/2020   NA 141 05/04/2020   K 4.5 05/04/2020   CL 101 05/04/2020   CO2 29 05/04/2020   Lab Results  Component Value Date   ALT 11 05/04/2020   AST 18 05/04/2020   ALKPHOS 73 05/04/2020   BILITOT <0.2 05/04/2020   Lab Results  Component Value Date   HGBA1C 6.0 (H) 05/04/2020   HGBA1C 6.1 (H) 02/03/2020   HGBA1C 6.2 (H) 09/04/2019   HGBA1C 5.7 (H) 06/25/2019   HGBA1C 5.7 (A) 02/05/2018   Lab Results  Component Value Date   INSULIN 20.3 09/04/2019   INSULIN 7.6 06/25/2019   Lab Results  Component Value Date   TSH 3.200 02/03/2020   Lab Results  Component Value Date   CHOL 298 (H) 05/04/2020   HDL 39 (L) 05/04/2020   LDLCALC 233 (H) 05/04/2020   TRIG 137 05/04/2020   CHOLHDL 7 02/05/2018   Lab Results  Component Value Date   WBC 4.1 05/04/2020   HGB 12.8 05/04/2020   HCT 39.3 05/04/2020   MCV 90 05/04/2020   PLT 355 05/04/2020   Lab Results  Component Value Date   IRON 60 05/04/2020   TIBC 323 05/04/2020   FERRITIN 77 05/04/2020   Attestation Statements:   Reviewed by clinician on day of visit: allergies, medications, problem list, medical history, surgical history, family history, social history, and previous encounter notes.

## 2020-06-08 ENCOUNTER — Encounter: Payer: 59 | Admitting: Physical Therapy

## 2020-06-16 ENCOUNTER — Encounter: Payer: 59 | Admitting: Physical Therapy

## 2020-06-22 ENCOUNTER — Encounter: Payer: 59 | Admitting: Physical Therapy

## 2020-07-13 ENCOUNTER — Other Ambulatory Visit: Payer: Self-pay

## 2020-07-13 ENCOUNTER — Ambulatory Visit (INDEPENDENT_AMBULATORY_CARE_PROVIDER_SITE_OTHER): Payer: 59 | Admitting: Family Medicine

## 2020-07-13 ENCOUNTER — Encounter (INDEPENDENT_AMBULATORY_CARE_PROVIDER_SITE_OTHER): Payer: Self-pay | Admitting: Family Medicine

## 2020-07-13 VITALS — BP 109/72 | HR 71 | Temp 98.3°F | Ht 64.0 in | Wt 244.0 lb

## 2020-07-13 DIAGNOSIS — E782 Mixed hyperlipidemia: Secondary | ICD-10-CM | POA: Diagnosis not present

## 2020-07-13 DIAGNOSIS — E559 Vitamin D deficiency, unspecified: Secondary | ICD-10-CM | POA: Diagnosis not present

## 2020-07-13 DIAGNOSIS — Z6841 Body Mass Index (BMI) 40.0 and over, adult: Secondary | ICD-10-CM

## 2020-07-13 DIAGNOSIS — R5383 Other fatigue: Secondary | ICD-10-CM

## 2020-07-13 DIAGNOSIS — Z9189 Other specified personal risk factors, not elsewhere classified: Secondary | ICD-10-CM

## 2020-07-13 DIAGNOSIS — K5904 Chronic idiopathic constipation: Secondary | ICD-10-CM

## 2020-07-13 DIAGNOSIS — R7303 Prediabetes: Secondary | ICD-10-CM | POA: Diagnosis not present

## 2020-07-13 DIAGNOSIS — R0602 Shortness of breath: Secondary | ICD-10-CM | POA: Diagnosis not present

## 2020-07-13 MED ORDER — SAXENDA 18 MG/3ML ~~LOC~~ SOPN: 3.0000 mg | PEN_INJECTOR | Freq: Every day | SUBCUTANEOUS | 0 refills | Status: DC

## 2020-07-13 MED ORDER — LINACLOTIDE 145 MCG PO CAPS: 145.0000 ug | ORAL_CAPSULE | Freq: Every day | ORAL | 0 refills | Status: DC

## 2020-07-13 MED ORDER — INSULIN PEN NEEDLE 32G X 4 MM MISC: 0 refills | Status: DC

## 2020-07-13 NOTE — Progress Notes (Signed)
Chief Complaint:   OBESITY [name] is here to discuss her progress with her obesity treatment plan along with follow-up of her obesity related diagnoses.   Today's visit was #: 12 Starting weight: 242 lbs Starting date: 06/25/2019 Today's weight: 244 lbs Today's date: 07/13/2020 Total lbs lost to date: +2 lbs Body mass index is 41.88 kg/m.   Interim History: Perel has been eating her meals between 12 pm - 8 pm.  She has increased the incline on her treadmill.  She says she went to a house party on Thanksgiving.  She complains of chronic constipation, not made better with current bowel regimen. She says she is ready for a change in plan and is open to medications.  In the past, she has tried Vyvanse, Wellbutrin, metformin, and Qsymia. New IC today shows lower RMR.  Nutrition Plan: following a lower carbohydrate, vegetable and lean protein rich diet plan for 80% of the time.  Anti-obesity medications: Saxenda.  Hunger is well controlled. Cravings are well controlled. Activity: Cardio for 30 minutes 4 times per week.  Assessment/Plan:   1. Chronic idiopathic constipation Counseling Getting to Good Bowel Health: Your goal is to have one soft bowel movement each day. Drink at least 8 glasses of water each day. Eat plenty of fiber (goal is over 25 grams each day). It is best to get most of your fiber from dietary sources which includes leafy green vegetables, fresh fruit, and whole grains. You may need to add fiber with the help of OTC fiber supplements. These include Metamucil, Citrucel, and Benefiber. If you are still having trouble, try adding Miralax or Magnesium Citrate. If all of these changes do not work, contact me.  - linaclotide (LINZESS) 145 MCG CAPS capsule; Take 1 capsule (145 mcg total) by mouth daily before breakfast.  Dispense: 30 capsule; Refill: 0  2. Prediabetes Not at goal. Medication: Metformin. She will continue to focus on protein-rich, low simple carbohydrate  foods. We reviewed the importance of hydration, regular exercise for stress reduction, and restorative sleep.   Lab Results  Component Value Date   HGBA1C 6.0 (H) 05/04/2020   Lab Results  Component Value Date   INSULIN 20.3 09/04/2019   INSULIN 7.6 06/25/2019   - CBC with Differential/Platelet - Comprehensive metabolic panel - Hemoglobin A1c - Start Liraglutide -Weight Management (SAXENDA) 18 MG/3ML SOPN; Inject 3 mg into the skin daily.  Dispense: 15 mL; Refill: 0 - Insulin Pen Needle 32G X 4 MM MISC; Use daily with Saxenda pen  Dispense: 100 each; Refill: 0  3. Mixed hyperlipidemia Plan: Dietary changes: Increase soluble fiber. Decrease simple carbohydrates. Exercise changes: An average 40 minutes of moderate to vigorous-intensity aerobic activity 3 or 4 times per week.   Lab Results  Component Value Date   CHOL 298 (H) 05/04/2020   HDL 39 (L) 05/04/2020   LDLCALC 233 (H) 05/04/2020   TRIG 137 05/04/2020   CHOLHDL 7 02/05/2018   Lab Results  Component Value Date   ALT 11 05/04/2020   AST 18 05/04/2020   ALKPHOS 73 05/04/2020   BILITOT <0.2 05/04/2020   The 10-year ASCVD risk score Mikey Bussing DC Jr., et al., 2013) is: 2.4%   Values used to calculate the score:     Age: 64 years     Sex: Female     Is Non-Hispanic African American: Yes     Diabetic: No     Tobacco smoker: No     Systolic Blood Pressure: 834  mmHg     Is BP treated: No     HDL Cholesterol: 39 mg/dL     Total Cholesterol: 298 mg/dL  - Comprehensive metabolic panel - Lipid panel  4. Vitamin D deficiency Not at goal. Current vitamin D is 25.5, tested on 05/04/2020. Optimal goal > 50 ng/dL.   Plan:  [x]   Continue Vitamin D @50 ,000 IU every week. []   Continue home supplement daily. [x]   Follow-up for routine testing of Vitamin D at least 2-3 times per year to avoid over-replacement.  - VITAMIN D 25 Hydroxy (Vit-D Deficiency, Fractures)  5. Other fatigue Onetha had an IC performed today. Calorie and  protein goals were adjusted.  - Anemia panel - CBC with Differential/Platelet - Comprehensive metabolic panel - TSH - T4, free  6. SOB (shortness of breath) on exertion Brigitta has endorsed more shortness of breath on exertion recently. New IC completed today.  7. At risk for nausea NAOME BRIGANDI was given approximately 8 minutes of nausea prevention counseling today. Denicia is at risk for nausea due to her new or current medication. She was encouraged to titrate her medication slowly, make sure to stay hydrated, eat smaller portions throughout the day, and avoid high fat meals.   We have reviewed the risks and benefits of Saxenda. The patient denies a personal or family history of medullary thyroid cancer or MENII. The patient denies a history of pancreatitis.  Alternative treatment options have been discussed. Patient understands that all anti-obesity medications are contraindicated in pregnancy. The potential risks and benefits of Saxenda were reviewed with the patient, and alternative treatment options were discussed. All questions were answered, and the patient wishes to move forward with this medication.  Please visit [email or web address] for information about this medication. Please visit [email or web address] to review how to administer Saxenda.   Start with escalation dose: ? Week 1: 0.6 mg [place] once daily x 1 week ? Week 2: 1.2 mg [place] once daily x 1 week ? Week 3: 1.8 mg [place] once daily x 1 week ? Week 4: 2.4 mg [place] once daily x 1 week ? Week 5 onward: 3 mg [place] once daily  8. Class 3 severe obesity with serious comorbidity and body mass index (BMI) of 40.0 to 44.9 in adult, unspecified obesity type ([place])  Course: Delberta is currently in the action stage of change. As such, her goal is to continue with weight loss efforts.   Nutrition goals: She has agreed to keeping a food journal and adhering to recommended goals of 1200 calories and 85 grams of protein.   Exercise goals: For substantial  health benefits, adults should do at least 150 minutes (2 hours and 30 minutes) a week of moderate-intensity, or 75 minutes (1 hour and 15 minutes) a week of vigorous-intensity aerobic physical activity, or an equivalent combination of moderate- and vigorous-intensity aerobic activity. Aerobic activity should be performed in episodes of at least 10 minutes, and preferably, it should be spread throughout the week.  Behavioral modification strategies: increasing lean protein intake, decreasing simple carbohydrates, increasing vegetables and increasing water intake.  Keoni has agreed to follow-up with our clinic in 4 weeks. She was informed of the importance of frequent follow-up visits to maximize her success with intensive lifestyle modifications for her multiple health conditions.   Objective:   Blood pressure 109/72, pulse 71, temperature 98.3 F (36.8 C), temperature source Oral, height 5\' 4"  (1.626 m), weight 244 lb (110.7 kg), last menstrual period 06/16/2015, SpO2 93 %. Body  mass index is 41.88 kg/m.  General: Cooperative, alert, well developed, in no acute distress. HEENT: Conjunctivae and lids unremarkable. Cardiovascular: Regular rhythm.  Lungs: Normal work of breathing. Neurologic: No focal deficits.   Lab Results  Component Value Date   CREATININE 0.83 05/04/2020   BUN 10 05/04/2020   NA 141 05/04/2020   K 4.5 05/04/2020   CL 101 05/04/2020   CO2 29 05/04/2020   Lab Results  Component Value Date   ALT 11 05/04/2020   AST 18 05/04/2020   ALKPHOS 73 05/04/2020   BILITOT <0.2 05/04/2020   Lab Results  Component Value Date   HGBA1C 6.0 (H) 05/04/2020   HGBA1C 6.1 (H) 02/03/2020   HGBA1C 6.2 (H) 09/04/2019   HGBA1C 5.7 (H) 06/25/2019   HGBA1C 5.7 (A) 02/05/2018   Lab Results  Component Value Date   INSULIN 20.3 09/04/2019   INSULIN 7.6 06/25/2019   Lab Results  Component Value Date   TSH 3.200 02/03/2020   Lab Results  Component Value Date   CHOL 298 (H)  05/04/2020   HDL 39 (L) 05/04/2020   LDLCALC 233 (H) 05/04/2020   TRIG 137 05/04/2020   CHOLHDL 7 02/05/2018   Lab Results  Component Value Date   WBC 4.1 05/04/2020   HGB 12.8 05/04/2020   HCT 39.3 05/04/2020   MCV 90 05/04/2020   PLT 355 05/04/2020   Lab Results  Component Value Date   IRON 60 05/04/2020   TIBC 323 05/04/2020   FERRITIN 77 05/04/2020   Attestation Statements:   Reviewed by clinician on day of visit: allergies, medications, problem list, medical history, surgical history, family history, social history, and previous encounter notes.  I, Water quality scientist, CMA, am acting as transcriptionist for Briscoe Deutscher, DO  I have reviewed the above documentation for accuracy and completeness, and I agree with the above. Briscoe Deutscher, DO

## 2020-07-14 LAB — LIPID PANEL
Chol/HDL Ratio: 8.4 ratio — ABNORMAL HIGH (ref 0.0–4.4)
Cholesterol, Total: 329 mg/dL — ABNORMAL HIGH (ref 100–199)
HDL: 39 mg/dL — ABNORMAL LOW (ref >39)
LDL Chol Calc (NIH): 265 mg/dL — ABNORMAL HIGH (ref 0–99)
Triglycerides: 131 mg/dL (ref 0–149)
VLDL Cholesterol Cal: 25 mg/dL (ref 5–40)

## 2020-07-14 LAB — T4, FREE: Free T4: 1.01 ng/dL (ref 0.82–1.77)

## 2020-07-14 LAB — CBC WITH DIFFERENTIAL/PLATELET
Basophils Absolute: 0 10*3/uL (ref 0.0–0.2)
Basos: 1 %
EOS (ABSOLUTE): 0.2 10*3/uL (ref 0.0–0.4)
Eos: 4 %
Hemoglobin: 12.3 g/dL (ref 11.1–15.9)
Immature Grans (Abs): 0 10*3/uL (ref 0.0–0.1)
Immature Granulocytes: 0 %
Lymphocytes Absolute: 1.4 10*3/uL (ref 0.7–3.1)
Lymphs: 38 %
MCH: 29.1 pg (ref 26.6–33.0)
MCHC: 32.7 g/dL (ref 31.5–35.7)
MCV: 89 fL (ref 79–97)
Monocytes Absolute: 0.5 10*3/uL (ref 0.1–0.9)
Monocytes: 13 %
Neutrophils Absolute: 1.7 10*3/uL (ref 1.4–7.0)
Neutrophils: 44 %
Platelets: 341 10*3/uL (ref 150–450)
RBC: 4.23 x10E6/uL (ref 3.77–5.28)
RDW: 13.8 % (ref 11.7–15.4)
WBC: 3.7 10*3/uL (ref 3.4–10.8)

## 2020-07-14 LAB — COMPREHENSIVE METABOLIC PANEL
ALT: 16 IU/L (ref 0–32)
AST: 20 IU/L (ref 0–40)
Albumin/Globulin Ratio: 1.2 (ref 1.2–2.2)
Albumin: 4 g/dL (ref 3.8–4.8)
Alkaline Phosphatase: 75 IU/L (ref 44–121)
BUN/Creatinine Ratio: 12 (ref 9–23)
BUN: 11 mg/dL (ref 6–24)
Bilirubin Total: 0.3 mg/dL (ref 0.0–1.2)
CO2: 27 mmol/L (ref 20–29)
Calcium: 9.2 mg/dL (ref 8.7–10.2)
Chloride: 100 mmol/L (ref 96–106)
Creatinine, Ser: 0.91 mg/dL (ref 0.57–1.00)
GFR calc Af Amer: 85 mL/min/{1.73_m2} (ref >59)
GFR calc non Af Amer: 74 mL/min/{1.73_m2} (ref >59)
Globulin, Total: 3.3 g/dL (ref 1.5–4.5)
Glucose: 107 mg/dL — ABNORMAL HIGH (ref 65–99)
Potassium: 4.4 mmol/L (ref 3.5–5.2)
Sodium: 139 mmol/L (ref 134–144)
Total Protein: 7.3 g/dL (ref 6.0–8.5)

## 2020-07-14 LAB — ANEMIA PANEL
Ferritin: 104 ng/mL (ref 15–150)
Folate, Hemolysate: 346 ng/mL
Folate, RBC: 920 ng/mL (ref >498)
Hematocrit: 37.6 % (ref 34.0–46.6)
Iron Saturation: 26 % (ref 15–55)
Iron: 87 ug/dL (ref 27–159)
Retic Ct Pct: 1.1 % (ref 0.6–2.6)
Total Iron Binding Capacity: 330 ug/dL (ref 250–450)
UIBC: 243 ug/dL (ref 131–425)
Vitamin B-12: 878 pg/mL (ref 232–1245)

## 2020-07-14 LAB — HEMOGLOBIN A1C
Est. average glucose Bld gHb Est-mCnc: 123 mg/dL
Hgb A1c MFr Bld: 5.9 % — ABNORMAL HIGH (ref 4.8–5.6)

## 2020-07-14 LAB — VITAMIN D 25 HYDROXY (VIT D DEFICIENCY, FRACTURES): Vit D, 25-Hydroxy: 26 ng/mL — ABNORMAL LOW (ref 30.0–100.0)

## 2020-07-14 LAB — TSH: TSH: 2.95 u[IU]/mL (ref 0.450–4.500)

## 2020-07-17 ENCOUNTER — Ambulatory Visit (INDEPENDENT_AMBULATORY_CARE_PROVIDER_SITE_OTHER): Payer: 59 | Admitting: Family Medicine

## 2020-07-17 ENCOUNTER — Encounter: Payer: Self-pay | Admitting: Family Medicine

## 2020-07-17 ENCOUNTER — Other Ambulatory Visit: Payer: Self-pay

## 2020-07-17 VITALS — BP 97/68 | HR 71 | Temp 97.8°F | Ht 64.0 in | Wt 248.0 lb

## 2020-07-17 DIAGNOSIS — Z136 Encounter for screening for cardiovascular disorders: Secondary | ICD-10-CM | POA: Diagnosis not present

## 2020-07-17 DIAGNOSIS — E559 Vitamin D deficiency, unspecified: Secondary | ICD-10-CM | POA: Diagnosis not present

## 2020-07-17 DIAGNOSIS — E782 Mixed hyperlipidemia: Secondary | ICD-10-CM

## 2020-07-17 DIAGNOSIS — K5904 Chronic idiopathic constipation: Secondary | ICD-10-CM

## 2020-07-17 MED ORDER — VITAMIN D3 250 MCG (10000 UT) PO CAPS: 10000.0000 [IU] | ORAL_CAPSULE | Freq: Every day | ORAL | 1 refills | Status: DC

## 2020-07-17 NOTE — Assessment & Plan Note (Signed)
Will be starting Linzess.

## 2020-07-17 NOTE — Assessment & Plan Note (Addendum)
Last LDL>200. Does not want to start meds at this point. Discussed life style modifications. Recommended diet high in fiber.   We will check carotid ultrasound for screening for coronary artery stenosis.

## 2020-07-17 NOTE — Patient Instructions (Signed)
It was very nice to see you today!  We will order and ultrasound of your neck.  Please change your vitamin D to 10000IU daily.   I will see you back in July for your next appointment.   Take care, Dr Jerline Pain  Please try these tips to maintain a healthy lifestyle:   Eat at least 3 REAL meals and 1-2 snacks per day.  Aim for no more than 5 hours between eating.  If you eat breakfast, please do so within one hour of getting up.    Each meal should contain half fruits/vegetables, one quarter protein, and one quarter carbs (no bigger than a computer mouse)   Cut down on sweet beverages. This includes juice, soda, and sweet tea.     Drink at least 1 glass of water with each meal and aim for at least 8 glasses per day   Exercise at least 150 minutes every week.

## 2020-07-17 NOTE — Assessment & Plan Note (Signed)
Vitamin D levels not at goal despite taking 50,000 international units weekly.  Will increase to 10,000 international units daily.  May have vitamin D rechecked in about 3 months.

## 2020-07-17 NOTE — Progress Notes (Signed)
   ROBBIE NANGLE is a 49 y.o. female who presents today for an office visit.  Assessment/Plan:  Chronic Problems Addressed Today: Chronic idiopathic constipation Will be starting Linzess.   Mixed hyperlipidemia Last LDL>200. Does not want to start meds at this point. Discussed life style modifications. Recommended diet high in fiber.   We will check carotid ultrasound for screening for coronary artery stenosis.  Vitamin D deficiency Vitamin D levels not at goal despite taking 50,000 international units weekly.  Will increase to 10,000 international units daily.  May have vitamin D rechecked in about 3 months.     Subjective:  HPI:  See A/p.         Objective:  Physical Exam: BP 97/68   Pulse 71   Temp 97.8 F (36.6 C) (Temporal)   Ht 5\' 4"  (1.626 m)   Wt 248 lb (112.5 kg)   LMP 06/16/2015 (Exact Date)   SpO2 99%   BMI 42.57 kg/m   Gen: No acute distress, resting comfortably Neuro: Grossly normal, moves all extremities Psych: Normal affect and thought content      Akaylah Lalley M. Jerline Pain, MD 07/17/2020 8:38 AM

## 2020-07-25 ENCOUNTER — Other Ambulatory Visit: Payer: 59

## 2020-07-25 DIAGNOSIS — Z20822 Contact with and (suspected) exposure to covid-19: Secondary | ICD-10-CM

## 2020-07-28 LAB — NOVEL CORONAVIRUS, NAA: SARS-CoV-2, NAA: NOT DETECTED

## 2020-07-29 ENCOUNTER — Telehealth (INDEPENDENT_AMBULATORY_CARE_PROVIDER_SITE_OTHER): Payer: Self-pay

## 2020-07-29 NOTE — Telephone Encounter (Signed)
PA for Saxenda denied due to med being excluded from plan

## 2020-08-11 ENCOUNTER — Ambulatory Visit (INDEPENDENT_AMBULATORY_CARE_PROVIDER_SITE_OTHER): Payer: 59 | Admitting: Family Medicine

## 2020-09-29 ENCOUNTER — Ambulatory Visit (INDEPENDENT_AMBULATORY_CARE_PROVIDER_SITE_OTHER): Payer: 59 | Admitting: Family Medicine

## 2020-11-18 ENCOUNTER — Telehealth: Payer: Self-pay

## 2020-11-18 NOTE — Telephone Encounter (Signed)
See note

## 2020-11-18 NOTE — Telephone Encounter (Signed)
Patient called in and stated she would like a Urgent Referral to Neuro she states she is having pain in her right leg under the skin. If there is any questions please call the patient.

## 2020-11-19 ENCOUNTER — Telehealth (INDEPENDENT_AMBULATORY_CARE_PROVIDER_SITE_OTHER): Payer: 59 | Admitting: Family Medicine

## 2020-11-19 DIAGNOSIS — R1031 Right lower quadrant pain: Secondary | ICD-10-CM | POA: Diagnosis not present

## 2020-11-19 NOTE — Telephone Encounter (Signed)
See note Patient has virtual appointment today

## 2020-11-19 NOTE — Progress Notes (Signed)
Virtual Visit via Telephone Note  I connected with [name] on 11/19/20 at  5:20 PM EDT by telephone and verified that I am speaking with the correct person using two identifiers.   I discussed the limitations, risks, security and privacy concerns of performing an evaluation and management service by telephone and the availability of in person appointments. I also discussed with the patient that there may be a patient responsible charge related to this service. The patient expressed understanding and agreed to proceed.  Location patient: home, [place] Location provider: work or home office Participants present for the call: patient, provider Patient did not have a visit with me in the prior 7 days to address this/these issue(s).   History of Present Illness:  Acute telemedicine visit for R groin discomfort: -Onset: intermittent for some time, but worsened after mowing grass and sitting at a concert -Symptoms include: active her whole life, worsening intermittent R groin pain for some time, intensified to point it was difficult to put weight on it was severe pain over the last several days. -Denies:weakness, numbness, fevers, swelling or lesion -Has tried: tylenol, goodies and other over-the-counter analgesics which are not helping   Observations/Objective: Patient sounds cheerful and well on the phone. I do not appreciate any SOB. Speech and thought processing are grossly intact. Patient reported vitals:  Assessment and Plan:  Right inguinal pain  -we discussed possible serious and likely etiologies, options for evaluation and workup, limitations of telemedicine visit vs in person visit, treatment, treatment risks and precautions. Pt prefers to treat via telemedicine empirically rather than in person at this moment.  Given the severity of her symptoms advised will need an in person evaluation at a higher level of care.  Her primary care office is not available.  Discussed options  and she has decided to go to an orthopedic urgent care this evening for evaluation.   Follow Up Instructions:  I did not refer this patient for an OV with me in the next 24 hours for this/these issue(s).  I discussed the assessment and treatment plan with the patient. The patient was provided an opportunity to ask questions and all were answered. The patient agreed with the plan and demonstrated an understanding of the instructions.   I spent 13 minutes on the date of this visit in the care of this patient. See summary of tasks completed to properly care for this patient in the detailed notes above which also included counseling of above, review of PMH, medications, allergies, evaluation of the patient and ordering and/or  instructing patient on testing and care options.     Lucretia Kern, DO

## 2020-11-19 NOTE — Patient Instructions (Signed)
Please go for an evaluation at the orthopedic urgent care this evening as we discussed.  If for some reason you are unable to be seen there, please seek care at an urgent care or emergency room today.  I hope you are feeling better soon!  It was nice to meet you today. I help [facility] out with telemedicine visits on Tuesdays and Thursdays and am available for visits on those days. If you have any concerns or questions following this visit please schedule a follow up visit with your Primary Care doctor or seek care at a local urgent care clinic to avoid delays in care.

## 2020-11-19 NOTE — Telephone Encounter (Signed)
Patient called in stating that she hasnt heard anything back. Read message below, and patient is scheduled for virtual tonight as she doesn't want to go to the ED. [name] states she feels like someone has a sharp nail and is dragging it from her hip to her knee. She said it started out with pain in her thigh, but over night it has gradually traveled to her hip and knee. States the pain currently is a 7-10 but last night it was a 10-10, has been taking over the counter medications and using a heating pad but it isnt helping.

## 2020-11-19 NOTE — Telephone Encounter (Signed)
Can we get a little more info? Would recommend office visit first so that we make sure we are sending her to the right place.  Algis Greenhouse. Jerline Pain, MD 11/19/2020 8:28 AM

## 2020-11-23 ENCOUNTER — Telehealth: Payer: Self-pay

## 2020-11-23 NOTE — Telephone Encounter (Signed)
Nurse Assessment Nurse: Martyn Ehrich RN, Felicia Date/Time (Eastern Time): 11/19/2020 6:17:21 PM Confirm and document reason for call. If symptomatic, describe symptoms. ---Pt had a televisit today with a young lady with the office and she was told she needed to go to orthopedic UC and she called them (ortho UC) and they said they could not do anything. Her symptoms are worse than when she had her televisit. she is about to enter [place]. She declined triage bc the UC closes soon and she wants to go in UC now. Does the patient have any new or worsening symptoms? ---Yes Will a triage be completed? ---No [facility] reason for no triage. ---Patient declined Disp. Time Eilene Ghazi Time) Disposition Final User 11/19/2020 6:01:04 PM Attempt made - no message left Gaddy, RN, [employer]

## 2020-11-23 NOTE — Telephone Encounter (Signed)
Noted patient went to Urgent Care

## 2020-12-26 ENCOUNTER — Other Ambulatory Visit: Payer: Self-pay

## 2020-12-26 ENCOUNTER — Emergency Department (HOSPITAL_BASED_OUTPATIENT_CLINIC_OR_DEPARTMENT_OTHER)
Admission: EM | Admit: 2020-12-26 | Discharge: 2020-12-27 | Disposition: A | Discharge status: Home / Self Care | Payer: 59 | Attending: Emergency Medicine | Admitting: Emergency Medicine

## 2020-12-26 DIAGNOSIS — G4489 Other headache syndrome: Secondary | ICD-10-CM

## 2020-12-26 DIAGNOSIS — Z86018 Personal history of other benign neoplasm: Secondary | ICD-10-CM | POA: Insufficient documentation

## 2020-12-26 DIAGNOSIS — R7309 Other abnormal glucose: Secondary | ICD-10-CM | POA: Diagnosis not present

## 2020-12-26 DIAGNOSIS — Z7984 Long term (current) use of oral hypoglycemic drugs: Secondary | ICD-10-CM | POA: Insufficient documentation

## 2020-12-26 DIAGNOSIS — R519 Headache, unspecified: Secondary | ICD-10-CM | POA: Diagnosis present

## 2020-12-26 DIAGNOSIS — M549 Dorsalgia, unspecified: Secondary | ICD-10-CM | POA: Insufficient documentation

## 2020-12-26 DIAGNOSIS — Z8542 Personal history of malignant neoplasm of other parts of uterus: Secondary | ICD-10-CM | POA: Insufficient documentation

## 2020-12-26 DIAGNOSIS — Z794 Long term (current) use of insulin: Secondary | ICD-10-CM | POA: Diagnosis not present

## 2020-12-26 DIAGNOSIS — Z7982 Long term (current) use of aspirin: Secondary | ICD-10-CM | POA: Diagnosis not present

## 2020-12-26 NOTE — ED Triage Notes (Signed)
Pt is present to the ED for an intermittent migraine x 3 weeks. Pt also c/o a pins and needles sensation in the middle of her back and numbness of her left arm that radiates down her leg. Pt also an occasional sharp chest pain x 2 weeks. Pt states, "I think I am dehydrated".

## 2020-12-27 LAB — CBC WITH DIFFERENTIAL/PLATELET
Abs Immature Granulocytes: 0.01 10*3/uL (ref 0.00–0.07)
Basophils Absolute: 0 10*3/uL (ref 0.0–0.1)
Basophils Relative: 0 %
Eosinophils Absolute: 0.2 10*3/uL (ref 0.0–0.5)
Eosinophils Relative: 3 %
HCT: 37.9 % (ref 36.0–46.0)
Hemoglobin: 12.3 g/dL (ref 12.0–15.0)
Immature Granulocytes: 0 %
Lymphocytes Relative: 45 %
Lymphs Abs: 2.5 10*3/uL (ref 0.7–4.0)
MCH: 29.2 pg (ref 26.0–34.0)
MCHC: 32.5 g/dL (ref 30.0–36.0)
MCV: 90 fL (ref 80.0–100.0)
Monocytes Absolute: 0.7 10*3/uL (ref 0.1–1.0)
Monocytes Relative: 12 %
Neutro Abs: 2.3 10*3/uL (ref 1.7–7.7)
Neutrophils Relative %: 40 %
Platelets: 369 10*3/uL (ref 150–400)
RBC: 4.21 MIL/uL (ref 3.87–5.11)
RDW: 14.3 % (ref 11.5–15.5)
WBC: 5.7 10*3/uL (ref 4.0–10.5)
nRBC: 0 % (ref 0.0–0.2)

## 2020-12-27 LAB — BASIC METABOLIC PANEL
Anion gap: 5 (ref 5–15)
BUN: 12 mg/dL (ref 6–20)
CO2: 31 mmol/L (ref 22–32)
Calcium: 8.8 mg/dL — ABNORMAL LOW (ref 8.9–10.3)
Chloride: 102 mmol/L (ref 98–111)
Creatinine, Ser: 0.95 mg/dL (ref 0.44–1.00)
GFR, Estimated: >60 mL/min (ref >60)
Glucose, Bld: 99 mg/dL (ref 70–99)
Potassium: 4.5 mmol/L (ref 3.5–5.1)
Sodium: 138 mmol/L (ref 135–145)

## 2020-12-27 MED ORDER — KETOROLAC TROMETHAMINE 30 MG/ML IJ SOLN
30.0000 mg | Freq: Once | INTRAMUSCULAR | Status: AC
  Administered 2020-12-27: 30 mg via INTRAVENOUS
  Filled 2020-12-27: qty 1

## 2020-12-27 MED ORDER — SODIUM CHLORIDE 0.9 % IV BOLUS (SEPSIS)
1000.0000 mL | Freq: Once | INTRAVENOUS | Status: AC
  Administered 2020-12-27: 1000 mL via INTRAVENOUS

## 2020-12-27 NOTE — Discharge Instructions (Addendum)
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
## 2020-12-28 ENCOUNTER — Encounter: Payer: Self-pay | Admitting: Neurology

## 2020-12-30 ENCOUNTER — Other Ambulatory Visit: Payer: Self-pay

## 2020-12-31 ENCOUNTER — Ambulatory Visit (INDEPENDENT_AMBULATORY_CARE_PROVIDER_SITE_OTHER): Payer: 59 | Admitting: Family Medicine

## 2020-12-31 ENCOUNTER — Telehealth: Payer: Self-pay | Admitting: Family Medicine

## 2020-12-31 ENCOUNTER — Encounter: Payer: Self-pay | Admitting: Family Medicine

## 2020-12-31 VITALS — BP 120/76 | HR 68 | Temp 97.4°F | Ht 64.0 in | Wt 251.2 lb

## 2020-12-31 DIAGNOSIS — G4452 New daily persistent headache (NDPH): Secondary | ICD-10-CM | POA: Diagnosis not present

## 2020-12-31 DIAGNOSIS — F419 Anxiety disorder, unspecified: Secondary | ICD-10-CM

## 2020-12-31 MED ORDER — ZOLMITRIPTAN 2.5 MG PO TABS: 2.5000 mg | ORAL_TABLET | Freq: Once | ORAL | 0 refills | Status: DC

## 2020-12-31 MED ORDER — BUPROPION HCL ER (SR) 100 MG PO TB12: 100.0000 mg | ORAL_TABLET | Freq: Two times a day (BID) | ORAL | 1 refills | Status: DC

## 2020-12-31 NOTE — Telephone Encounter (Signed)
Patient would like to do a TOC to Dr. Gena Fray at [facility]. Please advise if transfer is okay.
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
## 2021-01-01 NOTE — Telephone Encounter (Signed)
[place] with me.  Algis Greenhouse. Jerline Pain, MD 01/01/2021 2:42 PM

## 2021-01-18 ENCOUNTER — Other Ambulatory Visit: Payer: Self-pay

## 2021-01-18 ENCOUNTER — Encounter: Payer: Self-pay | Admitting: Family Medicine

## 2021-01-18 ENCOUNTER — Ambulatory Visit (INDEPENDENT_AMBULATORY_CARE_PROVIDER_SITE_OTHER): Payer: 59 | Admitting: Family Medicine

## 2021-01-18 VITALS — BP 118/70 | HR 61 | Temp 97.3°F | Ht 64.0 in | Wt 251.0 lb

## 2021-01-18 DIAGNOSIS — E782 Mixed hyperlipidemia: Secondary | ICD-10-CM

## 2021-01-18 DIAGNOSIS — Z6841 Body Mass Index (BMI) 40.0 and over, adult: Secondary | ICD-10-CM

## 2021-01-18 DIAGNOSIS — E559 Vitamin D deficiency, unspecified: Secondary | ICD-10-CM

## 2021-01-18 DIAGNOSIS — G43109 Migraine with aura, not intractable, without status migrainosus: Secondary | ICD-10-CM | POA: Diagnosis not present

## 2021-01-18 DIAGNOSIS — D500 Iron deficiency anemia secondary to blood loss (chronic): Secondary | ICD-10-CM | POA: Diagnosis not present

## 2021-01-18 DIAGNOSIS — E66813 Obesity, class 3: Secondary | ICD-10-CM

## 2021-01-18 DIAGNOSIS — R7303 Prediabetes: Secondary | ICD-10-CM | POA: Diagnosis not present

## 2021-01-18 DIAGNOSIS — G4484 Primary exertional headache: Secondary | ICD-10-CM | POA: Diagnosis not present

## 2021-01-18 LAB — CBC
HCT: 37.3 % (ref 36.0–46.0)
Hemoglobin: 12.3 g/dL (ref 12.0–15.0)
MCHC: 33.1 g/dL (ref 30.0–36.0)
MCV: 88.6 fl (ref 78.0–100.0)
Platelets: 380 10*3/uL (ref 150.0–400.0)
RBC: 4.21 Mil/uL (ref 3.87–5.11)
RDW: 14.2 % (ref 11.5–15.5)
WBC: 4.4 10*3/uL (ref 4.0–10.5)

## 2021-01-18 LAB — LIPID PANEL
Cholesterol: 288 mg/dL — ABNORMAL HIGH (ref 0–200)
HDL: 41.5 mg/dL (ref >39.00)
LDL Cholesterol: 221 mg/dL — ABNORMAL HIGH (ref 0–99)
NonHDL: 246.36
Total CHOL/HDL Ratio: 7
Triglycerides: 125 mg/dL (ref 0.0–149.0)
VLDL: 25 mg/dL (ref 0.0–40.0)

## 2021-01-18 LAB — HEMOGLOBIN A1C: Hgb A1c MFr Bld: 6.4 % (ref 4.6–6.5)

## 2021-01-18 LAB — VITAMIN D 25 HYDROXY (VIT D DEFICIENCY, FRACTURES): VITD: 28.57 ng/mL — ABNORMAL LOW (ref 30.00–100.00)
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
## 2021-01-19 MED ORDER — VITAMIN D (ERGOCALCIFEROL) 1.25 MG (50000 UNIT) PO CAPS
50000.0000 [IU] | ORAL_CAPSULE | ORAL | 3 refills | Status: DC
Start: 2021-01-19 — End: 2021-03-01

## 2021-01-19 NOTE — Addendum Note (Signed)
Addended by: Haydee Salter on: 01/19/2021 05:54 PM   Modules accepted: Orders

## 2021-01-20 ENCOUNTER — Encounter (INDEPENDENT_AMBULATORY_CARE_PROVIDER_SITE_OTHER): Payer: Self-pay

## 2021-01-20 ENCOUNTER — Ambulatory Visit (INDEPENDENT_AMBULATORY_CARE_PROVIDER_SITE_OTHER): Payer: 59 | Admitting: Family Medicine

## 2021-02-03 ENCOUNTER — Ambulatory Visit
Admission: RE | Admit: 2021-02-03 | Discharge: 2021-02-03 | Disposition: A | Discharge status: Home / Self Care | Payer: 59 | Source: Ambulatory Visit | Attending: Family Medicine | Admitting: Family Medicine

## 2021-02-03 ENCOUNTER — Other Ambulatory Visit: Payer: Self-pay

## 2021-02-03 DIAGNOSIS — G4484 Primary exertional headache: Secondary | ICD-10-CM
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
## 2021-02-04 ENCOUNTER — Ambulatory Visit (INDEPENDENT_AMBULATORY_CARE_PROVIDER_SITE_OTHER): Payer: 59 | Admitting: Neurology

## 2021-02-04 ENCOUNTER — Encounter: Payer: Self-pay | Admitting: Neurology

## 2021-02-04 VITALS — BP 134/83 | HR 66 | Ht 64.0 in | Wt 251.0 lb

## 2021-02-04 DIAGNOSIS — R519 Headache, unspecified: Secondary | ICD-10-CM

## 2021-02-04 DIAGNOSIS — G4484 Primary exertional headache: Secondary | ICD-10-CM

## 2021-02-04 DIAGNOSIS — G43109 Migraine with aura, not intractable, without status migrainosus: Secondary | ICD-10-CM

## 2021-02-04 NOTE — Patient Instructions (Addendum)
Check CTA of head and neck  Limit use of pain relievers to no more than 2 days out of week to prevent risk of rebound or medication-overuse headache. Keep headache diary Hold exercising until the CTA is performed and reviewed.  Once that is done, plan would be to start indomethacin and consider propranolol. Follow up 6 months.   We have sent a referral to [facility] for yourCTAI and they will call you directly to schedule your appointment. They are located at [place]. If you need to contact them directly please call [phone].

## 2021-02-17 ENCOUNTER — Encounter: Payer: 59 | Admitting: Family Medicine

## 2021-03-01 ENCOUNTER — Other Ambulatory Visit: Payer: Self-pay

## 2021-03-01 ENCOUNTER — Encounter: Payer: Self-pay | Admitting: Family Medicine

## 2021-03-01 ENCOUNTER — Ambulatory Visit (INDEPENDENT_AMBULATORY_CARE_PROVIDER_SITE_OTHER): Payer: 59 | Admitting: Family Medicine

## 2021-03-01 VITALS — BP 120/82 | HR 62 | Temp 97.6°F | Ht 64.0 in | Wt 254.6 lb

## 2021-03-01 DIAGNOSIS — E782 Mixed hyperlipidemia: Secondary | ICD-10-CM | POA: Diagnosis not present

## 2021-03-01 DIAGNOSIS — E559 Vitamin D deficiency, unspecified: Secondary | ICD-10-CM

## 2021-03-01 DIAGNOSIS — G43109 Migraine with aura, not intractable, without status migrainosus: Secondary | ICD-10-CM

## 2021-03-01 DIAGNOSIS — Z6841 Body Mass Index (BMI) 40.0 and over, adult: Secondary | ICD-10-CM

## 2021-03-01 MED ORDER — VITAMIN D (ERGOCALCIFEROL) 1.25 MG (50000 UNIT) PO CAPS: 50000.0000 [IU] | ORAL_CAPSULE | ORAL | 3 refills | Status: DC

## 2021-03-01 MED ORDER — ROSUVASTATIN CALCIUM 5 MG PO TABS: 5.0000 mg | ORAL_TABLET | Freq: Every day | ORAL | 3 refills | Status: DC

## 2021-03-01 NOTE — Patient Instructions (Signed)
Dyslipidemia Dyslipidemia is an imbalance of waxy, fat-like substances (lipids) in the blood. The body needs lipids in small amounts. Dyslipidemia often involves a high level of cholesterol or triglycerides, which are types oflipids. Common forms of dyslipidemia include: High levels of LDL cholesterol. LDL is the type of cholesterol that causes fatty deposits (plaques) to build up in the blood vessels that carry blood away from your heart (arteries). Low levels of HDL cholesterol. HDL cholesterol is the type of cholesterol that protects against heart disease. High levels of HDL remove the LDL buildup from arteries. High levels of triglycerides. Triglycerides are a fatty substance in the blood that is linked to a buildup of plaques in the arteries. What are the causes? Primary dyslipidemia is caused by changes (mutations) in genes that are passed down through families (inherited). These mutations cause several types of dyslipidemia. Secondary dyslipidemia is caused by lifestyle choices and diseases that lead to dyslipidemia, such as: Eating a diet that is high in animal fat. Not getting enough exercise. Having diabetes, kidney disease, liver disease, or thyroid disease. Drinking large amounts of alcohol. Using certain medicines. What increases the risk? You are more likely to develop this condition if you are an older man or if you are a woman who has gone through menopause. Other risk factors include: Having a family history of dyslipidemia. Taking certain medicines, including birth control pills, steroids, some diuretics, and beta-blockers. Smoking cigarettes. Eating a high-fat diet. Having certain medical conditions such as diabetes, polycystic ovary syndrome (PCOS), kidney disease, liver disease, or hypothyroidism. Not exercising regularly. Being overweight or obese with too much belly fat. What are the signs or symptoms? In most cases, dyslipidemia does not usually cause any symptoms. In  severe cases, very high lipid levels can cause: Fatty bumps under the skin (xanthomas). White or gray ring around the black center (pupil) of the eye. Very high triglyceride levels can cause inflammation of the pancreas (pancreatitis). How is this diagnosed? Your health care provider may diagnose dyslipidemia based on a routine blood test (fasting blood test). Because most people do not have symptoms of the condition, this blood testing (lipid profile) is done on adults age 42 and older and is repeated every 5 years. This test checks: Total cholesterol. This measures the total amount of cholesterol in your blood, including LDL cholesterol, HDL cholesterol, and triglycerides. A healthy number is below 200. LDL cholesterol. The target number for LDL cholesterol is different for each person, depending on individual risk factors. Ask your health care provider what your LDL cholesterol should be. HDL cholesterol. An HDL level of 60 or higher is best because it helps to protect against heart disease. A number below 54 for men or below 73 for women increases the risk for heart disease. Triglycerides. A healthy triglyceride number is below 150. If your lipid profile is abnormal, your health care provider may do other bloodtests. How is this treated? Treatment depends on the type of dyslipidemia that you have and your other risk factors for heart disease and stroke. Your health care provider will have atarget range for your lipid levels based on this information. For many people, this condition may be treated by lifestyle changes, such as diet and exercise. Your health care provider may recommend that you: Get regular exercise. Make changes to your diet. Quit smoking if you smoke. If diet changes and exercise do not help you reach your goals, your health care provider may also prescribe medicine to lower lipids. The most commonly  prescribed type of medicine lowers your LDL cholesterol (statin drug). If you  have a high triglyceride level, your provider may prescribe another type of drug (fibrate) or an omega-3 fish oil supplement, or both. Follow these instructions at home:  Eating and drinking Follow instructions from your health care provider or dietitian about eating or drinking restrictions. Eat a healthy diet as told by your health care provider. This can help you reach and maintain a healthy weight, lower your LDL cholesterol, and raise your HDL cholesterol. This may include: Limiting your calories, if you are overweight. Eating more fruits, vegetables, whole grains, fish, and lean meats. Limiting saturated fat, trans fat, and cholesterol. If you drink alcohol: Limit how much you use. Be aware of how much alcohol is in your drink. In the [place] one drink equals one 12 oz bottle of beer (355 mL), one 5 oz glass of wine (148 mL), or one 1 oz glass of hard liquor (44 mL). Do not drink alcohol if: Your health care provider tells you not to drink. You are pregnant, may be pregnant, or are planning to become pregnant. Activity Get regular exercise. Start an exercise and strength training program as told by your health care provider. Ask your health care provider what activities are safe for you. Your health care provider may recommend: 30 minutes of aerobic activity 4-6 days a week. Brisk walking is an example of aerobic activity. Strength training 2 days a week. General instructions Do not use any products that contain nicotine or tobacco, such as cigarettes, e-cigarettes, and chewing tobacco. If you need help quitting, ask your health care provider. Take over-the-counter and prescription medicines only as told by your health care provider. This includes supplements. Keep all follow-up visits as told by your health care provider. Contact a health care provider if: You are: Having trouble sticking to your exercise or diet plan. Struggling to quit smoking or control your use of  alcohol. Summary Dyslipidemia often involves a high level of cholesterol or triglycerides, which are types of lipids. Treatment depends on the type of dyslipidemia that you have and your other risk factors for heart disease and stroke. For many people, treatment starts with lifestyle changes, such as diet and exercise. Your health care provider may prescribe medicine to lower lipids. This information is not intended to replace advice given to you by your health care provider. Make sure you discuss any questions you have with your healthcare provider. Document Revised: 03/19/2018 Document Reviewed: 02/23/2018 Elsevier Patient Education  [place].
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
## 2021-03-02 ENCOUNTER — Ambulatory Visit (INDEPENDENT_AMBULATORY_CARE_PROVIDER_SITE_OTHER): Payer: 59 | Admitting: Family Medicine

## 2021-03-02 ENCOUNTER — Encounter (INDEPENDENT_AMBULATORY_CARE_PROVIDER_SITE_OTHER): Payer: Self-pay | Admitting: Family Medicine

## 2021-03-02 VITALS — BP 103/55 | HR 62 | Temp 97.9°F | Ht 64.0 in | Wt 247.0 lb

## 2021-03-02 DIAGNOSIS — E559 Vitamin D deficiency, unspecified: Secondary | ICD-10-CM | POA: Diagnosis not present

## 2021-03-02 DIAGNOSIS — G4709 Other insomnia: Secondary | ICD-10-CM | POA: Diagnosis not present

## 2021-03-02 DIAGNOSIS — E7800 Pure hypercholesterolemia, unspecified: Secondary | ICD-10-CM | POA: Diagnosis not present

## 2021-03-02 DIAGNOSIS — E1169 Type 2 diabetes mellitus with other specified complication: Secondary | ICD-10-CM

## 2021-03-02 DIAGNOSIS — Z9189 Other specified personal risk factors, not elsewhere classified: Secondary | ICD-10-CM

## 2021-03-02 DIAGNOSIS — Z6841 Body Mass Index (BMI) 40.0 and over, adult: Secondary | ICD-10-CM

## 2021-03-02 MED ORDER — TIRZEPATIDE 2.5 MG/0.5ML ~~LOC~~ SOAJ: 2.5000 mg | SUBCUTANEOUS | 0 refills | Status: DC

## 2021-03-02 NOTE — Progress Notes (Signed)
Chief Complaint:   OBESITY [name] is here to discuss her progress with her obesity treatment plan along with follow-up of her obesity related diagnoses. See Medical Weight Management Flowsheet for complete bioelectrical impedance results.  Today's visit was #: 70 Starting weight: 242 lbs Starting date: 06/25/2019 Today's weight: 247 lbs Today's date: 03/02/2021 Weight change since last visit: +3 lbs Total lbs lost to date: +5 lbs Body mass index is 42.4 kg/m.   Interim History: [name] says she is interested in bariatric surgery. Plan:  Refer to [facility].  Nutrition Plan: keeping a food journal and adhering to recommended goals of 1200 calories and 85 grams of protein. Activity:  None.  Assessment/Plan:   1. Type 2 diabetes mellitus with other specified complication, without long-term current use of insulin (HCC) Diabetes Mellitus: Not at goal. Medication: None.   Plan:  Start Mounjaro 2.5 mg subcutaneously weekly.  Vyvanse, Wellbutrin, metformin, Qsymia, Saxenda not covered. The patient will continue to focus on protein-rich, low simple carbohydrate foods. We reviewed the importance of hydration, regular exercise for stress reduction, and restorative sleep.   Lab Results  Component Value Date   HGBA1C 6.4 01/18/2021   HGBA1C 5.9 (H) 07/13/2020   HGBA1C 6.0 (H) 05/04/2020   Lab Results  Component Value Date   LDLCALC 221 (H) 01/18/2021   CREATININE 0.95 12/27/2020   - Start tirzepatide [facility]) 2.5 MG/0.5ML Pen; Inject 2.5 mg into the skin once a week.  Dispense: 2 mL; Refill: 0  2. Other insomnia Recommend sleep hygiene measures including regular sleep schedule, optimal sleep environment, and relaxing presleep rituals.  Will monitor.   3. Vitamin D deficiency Not at goal.   Plan: Continue to take prescription Vitamin D '@50'$ ,000 IU every week as prescribed.  Follow-up for routine testing of Vitamin D, at least 2-3 times per year to avoid  over-replacement.  Lab Results  Component Value Date   VD25OH 28.57 (L) 01/18/2021   VD25OH 26.0 (L) 07/13/2020   VD25OH 25.5 (L) 05/04/2020   4. Pure hypercholesterolemia Course: Not at goal. Lipid-lowering medications: Crestor 5 mg daily.   Plan: Dietary changes: Increase soluble fiber, decrease simple carbohydrates, decrease saturated fat. Exercise changes: Moderate to vigorous-intensity aerobic activity 150 minutes per week or as tolerated. We will continue to monitor along with PCP/specialists as it pertains to her weight loss journey.  Lab Results  Component Value Date   CHOL 288 (H) 01/18/2021   HDL 41.50 01/18/2021   LDLCALC 221 (H) 01/18/2021   TRIG 125.0 01/18/2021   CHOLHDL 7 01/18/2021   Lab Results  Component Value Date   ALT 16 07/13/2020   AST 20 07/13/2020   ALKPHOS 75 07/13/2020   BILITOT 0.3 07/13/2020   The 10-year ASCVD risk score Mikey Bussing DC Jr., et al., 2013) is: 4%   Values used to calculate the score:     Age: 50 years     Sex: Female     Is Non-Hispanic African American: Yes     Diabetic: Yes     Tobacco smoker: No     Systolic Blood Pressure: [id number] mmHg     Is BP treated: No     HDL Cholesterol: 41.5 mg/dL     Total Cholesterol: 288 mg/dL  5. At risk for heart disease Due to [name]'s current state of health and medical condition(s), she is at a higher risk for heart disease.  This puts the patient at much greater risk to subsequently develop cardiopulmonary conditions  that can significantly affect patient's quality of life in a negative manner.    At least 9 minutes were spent on counseling [name] about these concerns today. Evidence-based interventions for health behavior change were utilized today including the discussion of self monitoring techniques, problem-solving barriers, and SMART goal setting techniques.  Specifically, regarding patient's less desirable eating habits and patterns, we employed the technique of small changes when [name] has not  been able to fully commit to her prudent nutritional plan.  6. Obesity, current BMI 42.5  - Ambulatory referral to General Surgery  Course: [name] is currently in the action stage of change. As such, her goal is to continue with weight loss efforts.   Nutrition goals: She has agreed to practicing portion control and making smarter food choices, such as increasing vegetables and decreasing simple carbohydrates.  Journal.  Exercise goals:  As is.  Behavioral modification strategies: increasing lean protein intake, decreasing simple carbohydrates, increasing vegetables, increasing water intake, and emotional eating strategies.  [name] has agreed to follow-up with our clinic in 3 weeks. She was informed of the importance of frequent follow-up visits to maximize her success with intensive lifestyle modifications for her multiple health conditions.   Objective:   Blood pressure (!) 103/55, pulse 62, temperature 97.9 F (36.6 C), temperature source Oral, height '5\' 4"'$  (1.626 m), weight 247 lb (112 kg), last menstrual period 06/16/2015, SpO2 97 %. Body mass index is 42.4 kg/m.  General: Cooperative, alert, well developed, in no acute distress. HEENT: Conjunctivae and lids unremarkable. Cardiovascular: Regular rhythm.  Lungs: Normal work of breathing. Neurologic: No focal deficits.   Lab Results  Component Value Date   CREATININE 0.95 12/27/2020   BUN 12 12/27/2020   NA 138 12/27/2020   K 4.5 12/27/2020   CL 102 12/27/2020   CO2 31 12/27/2020   Lab Results  Component Value Date   ALT 16 07/13/2020   AST 20 07/13/2020   ALKPHOS 75 07/13/2020   BILITOT 0.3 07/13/2020   Lab Results  Component Value Date   HGBA1C 6.4 01/18/2021   HGBA1C 5.9 (H) 07/13/2020   HGBA1C 6.0 (H) 05/04/2020   HGBA1C 6.1 (H) 02/03/2020   HGBA1C 6.2 (H) 09/04/2019   Lab Results  Component Value Date   INSULIN 20.3 09/04/2019   INSULIN 7.6 06/25/2019   Lab Results  Component Value Date   TSH 2.950  07/13/2020   Lab Results  Component Value Date   CHOL 288 (H) 01/18/2021   HDL 41.50 01/18/2021   LDLCALC 221 (H) 01/18/2021   TRIG 125.0 01/18/2021   CHOLHDL 7 01/18/2021   Lab Results  Component Value Date   VD25OH 28.57 (L) 01/18/2021   VD25OH 26.0 (L) 07/13/2020   VD25OH 25.5 (L) 05/04/2020   Lab Results  Component Value Date   WBC 4.4 01/18/2021   HGB 12.3 01/18/2021   HCT 37.3 01/18/2021   MCV 88.6 01/18/2021   PLT 380.0 01/18/2021   Lab Results  Component Value Date   IRON 87 07/13/2020   TIBC 330 07/13/2020   FERRITIN 104 07/13/2020   Attestation Statements:   Reviewed by clinician on day of visit: allergies, medications, problem list, medical history, surgical history, family history, social history, and previous encounter notes.  I, Water quality scientist, CMA, am acting as transcriptionist for Briscoe Deutscher, DO  I have reviewed the above documentation for accuracy and completeness, and I agree with the above. Briscoe Deutscher, DO

## 2021-03-03 ENCOUNTER — Other Ambulatory Visit: Payer: Self-pay

## 2021-03-03 ENCOUNTER — Ambulatory Visit
Admission: RE | Admit: 2021-03-03 | Discharge: 2021-03-03 | Disposition: A | Discharge status: Home / Self Care | Payer: 59 | Source: Ambulatory Visit | Attending: Neurology | Admitting: Neurology

## 2021-03-03 MED ORDER — IOPAMIDOL (ISOVUE-370) INJECTION 76%
75.0000 mL | Freq: Once | INTRAVENOUS | Status: AC | PRN
  Administered 2021-03-03: 75 mL via INTRAVENOUS

## 2021-03-04 ENCOUNTER — Other Ambulatory Visit: Payer: Self-pay | Admitting: Neurology

## 2021-03-04 ENCOUNTER — Encounter: Payer: Self-pay | Admitting: Family Medicine

## 2021-03-04 MED ORDER — INDOMETHACIN 25 MG PO CAPS: ORAL_CAPSULE | ORAL | 1 refills | Status: DC

## 2021-03-22 ENCOUNTER — Other Ambulatory Visit: Payer: Self-pay

## 2021-03-22 ENCOUNTER — Ambulatory Visit (INDEPENDENT_AMBULATORY_CARE_PROVIDER_SITE_OTHER): Payer: 59 | Admitting: Family Medicine

## 2021-03-22 ENCOUNTER — Encounter (INDEPENDENT_AMBULATORY_CARE_PROVIDER_SITE_OTHER): Payer: Self-pay | Admitting: Family Medicine

## 2021-03-22 VITALS — BP 106/70 | HR 83 | Temp 98.1°F | Ht 64.0 in | Wt 243.0 lb

## 2021-03-22 DIAGNOSIS — Z6841 Body Mass Index (BMI) 40.0 and over, adult: Secondary | ICD-10-CM

## 2021-03-22 DIAGNOSIS — G4484 Primary exertional headache: Secondary | ICD-10-CM | POA: Diagnosis not present

## 2021-03-22 DIAGNOSIS — Z9189 Other specified personal risk factors, not elsewhere classified: Secondary | ICD-10-CM

## 2021-03-22 DIAGNOSIS — E1169 Type 2 diabetes mellitus with other specified complication: Secondary | ICD-10-CM

## 2021-03-22 DIAGNOSIS — E8881 Metabolic syndrome: Secondary | ICD-10-CM

## 2021-03-22 DIAGNOSIS — E041 Nontoxic single thyroid nodule: Secondary | ICD-10-CM

## 2021-03-22 MED ORDER — TIRZEPATIDE 2.5 MG/0.5ML ~~LOC~~ SOAJ: 2.5000 mg | SUBCUTANEOUS | 0 refills | Status: DC

## 2021-03-22 NOTE — Progress Notes (Signed)
Chief Complaint:   OBESITY [name] is here to discuss her progress with her obesity treatment plan along with follow-up of her obesity related diagnoses.   Today's visit was #: 14 Starting weight: 242 lbs Starting date: 06/25/2019 Today's weight: 243 lbs Today's date: 03/23/2019 Weight change since last visit: -4 lbs Total lbs lost to date: +1 lb Body mass index is 41.71 kg/m.   Current Meal Plan: practicing portion control and making smarter food choices, such as increasing vegetables and decreasing simple carbohydrates for 100% of the time.  Current Exercise Plan: None at this time  Interim History: [name] reports the she is journaling and she is loving blueberries and okra. She also has not started [facility] yet.  Assessment/Plan:   1. Metabolic syndrome Starting goal: Lose 7-10% of starting weight. She will continue to focus on protein-rich, low simple carbohydrate foods. We reviewed the importance of hydration, regular exercise for stress reduction, and restorative sleep.  We will continue to check lab work every 3 months, with 10% weight loss, or should any other concerns arise.  2. Primary exertional headache New diagnosis with Neurology. This issue directly impacts care plan for optimization of BMI and metabolic health as it impacts the patient's ability to make lifestyle changes. We will continue to monitor symptoms as they relate to her weight loss journey.  3. Thyroid nodule 7 mm nodule seen on CT from 03/03/2021. No follow up recommended per CT impression. We will continue to monitor symptoms as they relate to her weight loss journey.  4. Type 2 diabetes mellitus with other specified complication, without long-term current use of insulin ([name]) Diabetes Mellitus: Not at goal. Medication: None. History of 6.5 A1C once in the past.   Plan: Start Mounjaro 2.5 mg subcutaneously weekly. The patient will continue to focus on protein-rich, low simple carbohydrate foods. We  reviewed the importance of hydration, regular exercise for stress reduction, and restorative sleep.   Lab Results  Component Value Date   HGBA1C 6.4 01/18/2021   HGBA1C 5.9 (H) 07/13/2020   HGBA1C 6.0 (H) 05/04/2020   Lab Results  Component Value Date   LDLCALC 221 (H) 01/18/2021   CREATININE 0.95 12/27/2020   - Start tirzepatide [name]) 2.5 MG/0.5ML Pen; Inject 2.5 mg into the skin once a week.  Dispense: 2 mL; Refill: 0  5. At risk for heart disease Due to [name]'s current state of health and medical condition(s), she is at a higher risk for heart disease.   This puts the patient at much greater risk to subsequently develop cardiopulmonary conditions that can significantly affect patient's quality of life in a negative manner as well.    At least 9 minutes was spent on counseling [name] about these concerns today. Initial goal is to lose at least 5-10% of starting weight to help reduce these risk factors.  We will continue to reassess these conditions on a fairly regular basis in an attempt to decrease patient's overall morbidity and mortality.  Evidence-based interventions for health behavior change were utilized today including the discussion of self monitoring techniques, problem-solving barriers and SMART goal setting techniques.  Specifically regarding patient's less desirable eating habits and patterns, we employed the technique of small changes when [name] has not been able to fully commit to her prudent nutritional plan.  6. Obesity, current BMI 41.7 Course: [name] is currently in the action stage of change. As such, her goal is to continue with weight loss efforts.   Nutrition goals: She has  agreed to practicing portion control and making smarter food choices, such as increasing vegetables and decreasing simple carbohydrates.   Handouts given: Vegetarian, mix & match  Exercise goals: As is.  Behavioral modification strategies: increasing lean protein intake, decreasing sodium  intake, increasing high fiber foods, and emotional eating strategies.  [name] has agreed to follow-up with our clinic in 3 weeks. She was informed of the importance of frequent follow-up visits to maximize her success with intensive lifestyle modifications for her multiple health conditions.   Objective:   Blood pressure 106/70, pulse 83, temperature 98.1 F (36.7 C), temperature source Oral, height '5\' 4"'$  (1.626 m), weight 243 lb (110.2 kg), last menstrual period 06/16/2015, SpO2 95 %. Body mass index is 41.71 kg/m.  General: Cooperative, alert, well developed, in no acute distress. HEENT: Conjunctivae and lids unremarkable. Cardiovascular: Regular rhythm.  Lungs: Normal work of breathing. Neurologic: No focal deficits.   Lab Results  Component Value Date   CREATININE 0.95 12/27/2020   BUN 12 12/27/2020   NA 138 12/27/2020   K 4.5 12/27/2020   CL 102 12/27/2020   CO2 31 12/27/2020   Lab Results  Component Value Date   ALT 16 07/13/2020   AST 20 07/13/2020   ALKPHOS 75 07/13/2020   BILITOT 0.3 07/13/2020   Lab Results  Component Value Date   HGBA1C 6.4 01/18/2021   HGBA1C 5.9 (H) 07/13/2020   HGBA1C 6.0 (H) 05/04/2020   HGBA1C 6.1 (H) 02/03/2020   HGBA1C 6.2 (H) 09/04/2019   Lab Results  Component Value Date   INSULIN 20.3 09/04/2019   INSULIN 7.6 06/25/2019   Lab Results  Component Value Date   TSH 2.950 07/13/2020   Lab Results  Component Value Date   CHOL 288 (H) 01/18/2021   HDL 41.50 01/18/2021   LDLCALC 221 (H) 01/18/2021   TRIG 125.0 01/18/2021   CHOLHDL 7 01/18/2021   Lab Results  Component Value Date   VD25OH 28.57 (L) 01/18/2021   VD25OH 26.0 (L) 07/13/2020   VD25OH 25.5 (L) 05/04/2020   Lab Results  Component Value Date   WBC 4.4 01/18/2021   HGB 12.3 01/18/2021   HCT 37.3 01/18/2021   MCV 88.6 01/18/2021   PLT 380.0 01/18/2021   Lab Results  Component Value Date   IRON 87 07/13/2020   TIBC 330 07/13/2020   FERRITIN 104 07/13/2020    Attestation Statements:   Reviewed by clinician on day of visit: allergies, medications, problem list, medical history, surgical history, family history, social history, and previous encounter notes.  Leodis Binet Friedenbach, CMA, am acting as Location manager for [employer], DO.  I have reviewed the above documentation for accuracy and completeness, and I agree with the above. Briscoe Deutscher, DO

## 2021-04-14 ENCOUNTER — Other Ambulatory Visit: Payer: Self-pay

## 2021-04-14 ENCOUNTER — Encounter (INDEPENDENT_AMBULATORY_CARE_PROVIDER_SITE_OTHER): Payer: Self-pay | Admitting: Family Medicine

## 2021-04-14 ENCOUNTER — Ambulatory Visit (INDEPENDENT_AMBULATORY_CARE_PROVIDER_SITE_OTHER): Payer: 59 | Admitting: Family Medicine

## 2021-04-14 VITALS — BP 104/67 | HR 63 | Temp 98.3°F | Ht 64.0 in | Wt 239.0 lb

## 2021-04-14 DIAGNOSIS — E1169 Type 2 diabetes mellitus with other specified complication: Secondary | ICD-10-CM

## 2021-04-14 DIAGNOSIS — E8881 Metabolic syndrome: Secondary | ICD-10-CM | POA: Diagnosis not present

## 2021-04-14 DIAGNOSIS — E65 Localized adiposity: Secondary | ICD-10-CM

## 2021-04-14 DIAGNOSIS — Z9189 Other specified personal risk factors, not elsewhere classified: Secondary | ICD-10-CM

## 2021-04-14 DIAGNOSIS — Z6841 Body Mass Index (BMI) 40.0 and over, adult: Secondary | ICD-10-CM

## 2021-04-14 MED ORDER — TIRZEPATIDE 5 MG/0.5ML ~~LOC~~ SOAJ: 5.0000 mg | SUBCUTANEOUS | 0 refills | Status: DC

## 2021-04-15 NOTE — Progress Notes (Signed)
Chief Complaint:   OBESITY [name] is here to discuss her progress with her obesity treatment plan along with follow-up of her obesity related diagnoses.   Today's visit was #: 15 Starting weight: 242 lbs Starting date: 06/25/2019 Today's weight: 239 lbs Today's date: 04/14/2021 Weight change since last visit: 4 lbs Total lbs lost to date: 3 lbs Body mass index is 41.02 kg/m.  Total weight loss percentage to date: -1.24%  Current Meal Plan: practicing portion control and making smarter food choices, such as increasing vegetables and decreasing simple carbohydrates for 100% of the time.  Current Exercise Plan: None. Current Anti-Obesity Medications: Mounjaro 2.5 mg subcutaneously weekly. Side effects: None.  Interim History:  [name] has had her 3rd injection of Mounjaro and is tolerating it well.    Assessment/Plan:   1. Type 2 diabetes mellitus with other specified complication, without long-term current use of insulin (HCC) Diabetes Mellitus: Not at goal. Medication: Mounjaro 2.5 mg subcutaneously weekly.   Plan:  Increase Mounjaro to 5 mg subcutaneously weekly.  The patient will continue to focus on protein-rich, low simple carbohydrate foods. We reviewed the importance of hydration, regular exercise for stress reduction, and restorative sleep.   Lab Results  Component Value Date   HGBA1C 6.4 01/18/2021   HGBA1C 5.9 (H) 07/13/2020   HGBA1C 6.0 (H) 05/04/2020   Lab Results  Component Value Date   LDLCALC 221 (H) 01/18/2021   CREATININE 0.95 12/27/2020   - Increase and refill tirzepatide (MOUNJARO) 5 MG/0.5ML Pen; Inject 5 mg into the skin once a week.  Dispense: 2 mL; Refill: 0  2. Visceral obesity Current visceral fat rating: 14. Visceral fat rating should be < 13. Visceral adipose tissue is a hormonally active component of total body fat. This body composition phenotype is associated with medical disorders such as metabolic syndrome, cardiovascular disease and several  malignancies including prostate, breast, and colorectal cancers. Starting goal: Lose 7-10% of starting weight.   3. Metabolic syndrome Starting goal: Lose 7-10% of starting weight. She will continue to focus on protein-rich, low simple carbohydrate foods. We reviewed the importance of hydration, regular exercise for stress reduction, and restorative sleep.  We will continue to check lab work every 3 months, with 10% weight loss, or should any other concerns arise.  4. At risk for deficient intake of food [name] was given extensive education and counseling today of more than 8 minutes on risks associated with deficient food intake.  Counseled her on the importance of following our prescribed meal plan and eating adequate amounts of protein.  Discussed with [name] that inadequate food intake over longer periods of time can slow their metabolism down significantly.   5. Obesity, current BMI 41.1  Course: [name] is currently in the action stage of change. As such, her goal is to continue with weight loss efforts.   Nutrition goals: She has agreed to practicing portion control and making smarter food choices, such as increasing vegetables and decreasing simple carbohydrates.   Exercise goals:  Walk 30 minutes per day.  Behavioral modification strategies: increasing water intake.  [name] has agreed to follow-up with our clinic in 4 weeks. She was informed of the importance of frequent follow-up visits to maximize her success with intensive lifestyle modifications for her multiple health conditions.   Objective:   Blood pressure 104/67, pulse 63, temperature 98.3 F (36.8 C), temperature source Oral, height '5\' 4"'$  (1.626 m), weight 239 lb (108.4 kg), last menstrual period 06/16/2015, SpO2 98 %.  Body mass index is 41.02 kg/m.  General: Cooperative, alert, well developed, in no acute distress. HEENT: Conjunctivae and lids unremarkable. Cardiovascular: Regular rhythm.  Lungs: Normal work of  breathing. Neurologic: No focal deficits.   Lab Results  Component Value Date   CREATININE 0.95 12/27/2020   BUN 12 12/27/2020   NA 138 12/27/2020   K 4.5 12/27/2020   CL 102 12/27/2020   CO2 31 12/27/2020   Lab Results  Component Value Date   ALT 16 07/13/2020   AST 20 07/13/2020   ALKPHOS 75 07/13/2020   BILITOT 0.3 07/13/2020   Lab Results  Component Value Date   HGBA1C 6.4 01/18/2021   HGBA1C 5.9 (H) 07/13/2020   HGBA1C 6.0 (H) 05/04/2020   HGBA1C 6.1 (H) 02/03/2020   HGBA1C 6.2 (H) 09/04/2019   Lab Results  Component Value Date   INSULIN 20.3 09/04/2019   INSULIN 7.6 06/25/2019   Lab Results  Component Value Date   TSH 2.950 07/13/2020   Lab Results  Component Value Date   CHOL 288 (H) 01/18/2021   HDL 41.50 01/18/2021   LDLCALC 221 (H) 01/18/2021   TRIG 125.0 01/18/2021   CHOLHDL 7 01/18/2021   Lab Results  Component Value Date   VD25OH 28.57 (L) 01/18/2021   VD25OH 26.0 (L) 07/13/2020   VD25OH 25.5 (L) 05/04/2020   Lab Results  Component Value Date   WBC 4.4 01/18/2021   HGB 12.3 01/18/2021   HCT 37.3 01/18/2021   MCV 88.6 01/18/2021   PLT 380.0 01/18/2021   Lab Results  Component Value Date   IRON 87 07/13/2020   TIBC 330 07/13/2020   FERRITIN 104 07/13/2020   Attestation Statements:   Reviewed by clinician on day of visit: allergies, medications, problem list, medical history, surgical history, family history, social history, and previous encounter notes.  I, Water quality scientist, CMA, am acting as transcriptionist for Briscoe Deutscher, DO  I have reviewed the above documentation for accuracy and completeness, and I agree with the above. Briscoe Deutscher, DO

## 2021-05-05 ENCOUNTER — Ambulatory Visit (INDEPENDENT_AMBULATORY_CARE_PROVIDER_SITE_OTHER): Payer: 59 | Admitting: Family Medicine

## 2021-05-05 ENCOUNTER — Other Ambulatory Visit: Payer: Self-pay

## 2021-05-05 ENCOUNTER — Encounter (INDEPENDENT_AMBULATORY_CARE_PROVIDER_SITE_OTHER): Payer: Self-pay | Admitting: Family Medicine

## 2021-05-05 VITALS — BP 103/66 | HR 62 | Temp 97.9°F | Ht 64.0 in | Wt 235.0 lb

## 2021-05-05 DIAGNOSIS — E7800 Pure hypercholesterolemia, unspecified: Secondary | ICD-10-CM | POA: Diagnosis not present

## 2021-05-05 DIAGNOSIS — Z9189 Other specified personal risk factors, not elsewhere classified: Secondary | ICD-10-CM | POA: Diagnosis not present

## 2021-05-05 DIAGNOSIS — E65 Localized adiposity: Secondary | ICD-10-CM | POA: Diagnosis not present

## 2021-05-05 DIAGNOSIS — E1169 Type 2 diabetes mellitus with other specified complication: Secondary | ICD-10-CM | POA: Diagnosis not present

## 2021-05-05 DIAGNOSIS — Z6841 Body Mass Index (BMI) 40.0 and over, adult: Secondary | ICD-10-CM

## 2021-05-05 MED ORDER — TIRZEPATIDE 5 MG/0.5ML ~~LOC~~ SOAJ: 5.0000 mg | SUBCUTANEOUS | 0 refills | Status: DC

## 2021-05-05 NOTE — Progress Notes (Signed)
Chief Complaint:   OBESITY [name] is here to discuss her progress with her obesity treatment plan along with follow-up of her obesity related diagnoses.   Today's visit was #: 43 Starting weight: 242 lbs Starting date: 06/25/2019 Today's weight: 235 lbs Today's date: 05/05/2021 Weight change since last visit: 4 lbs Total lbs lost to date: 7 lbs Body mass index is 40.34 kg/m.  Total weight loss percentage to date: -2.89%  Current Meal Plan: practicing portion control and making smarter food choices, such as increasing vegetables and decreasing simple carbohydrates for 100% of the time.  Current Exercise Plan: Cardio/walking for 40 minutes 2 times per week. Current Anti-Obesity Medications: Mounjaro 5 mg subcutaneously weekly. Side effects: None.  Interim History:  [name] is doing well.  She is tolerating her medication.  She says she is in couples counseling and it is helping.  Assessment/Plan:   1. Type 2 diabetes mellitus with other specified complication, without long-term current use of insulin (HCC) Diabetes Mellitus: Not at goal. Medication: Mounjaro 5 mg subcutaneously weekly.   Plan:  Continue Mounjaro at current dose.  Will refill today.  The patient will continue to focus on protein-rich, low simple carbohydrate foods. We reviewed the importance of hydration, regular exercise for stress reduction, and restorative sleep.   Lab Results  Component Value Date   HGBA1C 6.4 01/18/2021   HGBA1C 5.9 (H) 07/13/2020   HGBA1C 6.0 (H) 05/04/2020   Lab Results  Component Value Date   LDLCALC 221 (H) 01/18/2021   CREATININE 0.95 12/27/2020   - Refill tirzepatide (MOUNJARO) 5 MG/0.5ML Pen; Inject 5 mg into the skin once a week.  Dispense: 2 mL; Refill: 0  2. Visceral obesity Current visceral fat rating: 13. Visceral fat rating should be < 13. Visceral adipose tissue is a hormonally active component of total body fat. This body composition phenotype is associated with medical  disorders such as metabolic syndrome, cardiovascular disease and several malignancies including prostate, breast, and colorectal cancers. Starting goal: Lose 7-10% of starting weight.   3. Pure hypercholesterolemia Course: Not at goal. Lipid-lowering medications: None.   Plan: Dietary changes: Increase soluble fiber, decrease simple carbohydrates, decrease saturated fat. Exercise changes: Moderate to vigorous-intensity aerobic activity 150 minutes per week or as tolerated. We will continue to monitor along with PCP/specialists as it pertains to her weight loss journey.  Lab Results  Component Value Date   CHOL 288 (H) 01/18/2021   HDL 41.50 01/18/2021   LDLCALC 221 (H) 01/18/2021   TRIG 125.0 01/18/2021   CHOLHDL 7 01/18/2021   Lab Results  Component Value Date   ALT 16 07/13/2020   AST 20 07/13/2020   ALKPHOS 75 07/13/2020   BILITOT 0.3 07/13/2020   The 10-year ASCVD risk score (Arnett DK, et al., 2019) is: 4.5%   Values used to calculate the score:     Age: 49 years     Sex: Female     Is Non-Hispanic African American: Yes     Diabetic: Yes     Tobacco smoker: No     Systolic Blood Pressure: 275 mmHg     Is BP treated: No     HDL Cholesterol: 41.5 mg/dL     Total Cholesterol: 288 mg/dL  4. At risk for heart disease Due to [name]'s current state of health and medical condition(s), she is at a higher risk for heart disease.  This puts the patient at much greater risk to subsequently develop cardiopulmonary conditions that can  significantly affect patient's quality of life in a negative manner.    At least 8 minutes were spent on counseling [name] about these concerns today. Evidence-based interventions for health behavior change were utilized today including the discussion of self monitoring techniques, problem-solving barriers, and SMART goal setting techniques.  Specifically, regarding patient's less desirable eating habits and patterns, we employed the technique of small changes  when [name] has not been able to fully commit to her prudent nutritional plan.  5. Obesity, current BMI 40.3  Course: [name] is currently in the action stage of change. As such, her goal is to continue with weight loss efforts.   Nutrition goals: She has agreed to practicing portion control and making smarter food choices, such as increasing vegetables and decreasing simple carbohydrates.   Exercise goals:  As is.  Behavioral modification strategies: increasing lean protein intake.  [name] has agreed to follow-up with our clinic in 4 weeks. She was informed of the importance of frequent follow-up visits to maximize her success with intensive lifestyle modifications for her multiple health conditions.   Objective:   Blood pressure 103/66, pulse 62, temperature 97.9 F (36.6 C), temperature source Oral, height 5\' 4"  (1.626 m), weight 235 lb (106.6 kg), last menstrual period 06/16/2015, SpO2 97 %. Body mass index is 40.34 kg/m.  General: Cooperative, alert, well developed, in no acute distress. HEENT: Conjunctivae and lids unremarkable. Cardiovascular: Regular rhythm.  Lungs: Normal work of breathing. Neurologic: No focal deficits.   Lab Results  Component Value Date   CREATININE 0.95 12/27/2020   BUN 12 12/27/2020   NA 138 12/27/2020   K 4.5 12/27/2020   CL 102 12/27/2020   CO2 31 12/27/2020   Lab Results  Component Value Date   ALT 16 07/13/2020   AST 20 07/13/2020   ALKPHOS 75 07/13/2020   BILITOT 0.3 07/13/2020   Lab Results  Component Value Date   HGBA1C 6.4 01/18/2021   HGBA1C 5.9 (H) 07/13/2020   HGBA1C 6.0 (H) 05/04/2020   HGBA1C 6.1 (H) 02/03/2020   HGBA1C 6.2 (H) 09/04/2019   Lab Results  Component Value Date   INSULIN 20.3 09/04/2019   INSULIN 7.6 06/25/2019   Lab Results  Component Value Date   TSH 2.950 07/13/2020   Lab Results  Component Value Date   CHOL 288 (H) 01/18/2021   HDL 41.50 01/18/2021   LDLCALC 221 (H) 01/18/2021   TRIG 125.0  01/18/2021   CHOLHDL 7 01/18/2021   Lab Results  Component Value Date   VD25OH 28.57 (L) 01/18/2021   VD25OH 26.0 (L) 07/13/2020   VD25OH 25.5 (L) 05/04/2020   Lab Results  Component Value Date   WBC 4.4 01/18/2021   HGB 12.3 01/18/2021   HCT 37.3 01/18/2021   MCV 88.6 01/18/2021   PLT 380.0 01/18/2021   Lab Results  Component Value Date   IRON 87 07/13/2020   TIBC 330 07/13/2020   FERRITIN 104 07/13/2020   Attestation Statements:   Reviewed by clinician on day of visit: allergies, medications, problem list, medical history, surgical history, family history, social history, and previous encounter notes.  I, Water quality scientist, CMA, am acting as transcriptionist for Briscoe Deutscher, DO  I have reviewed the above documentation for accuracy and completeness, and I agree with the above. Briscoe Deutscher, DO

## 2021-05-06 ENCOUNTER — Encounter (INDEPENDENT_AMBULATORY_CARE_PROVIDER_SITE_OTHER): Payer: Self-pay

## 2021-05-11 ENCOUNTER — Other Ambulatory Visit (INDEPENDENT_AMBULATORY_CARE_PROVIDER_SITE_OTHER): Payer: Self-pay | Admitting: Family Medicine

## 2021-05-11 ENCOUNTER — Encounter (INDEPENDENT_AMBULATORY_CARE_PROVIDER_SITE_OTHER): Payer: Self-pay | Admitting: Family Medicine

## 2021-05-11 DIAGNOSIS — E1169 Type 2 diabetes mellitus with other specified complication: Secondary | ICD-10-CM

## 2021-05-30 ENCOUNTER — Encounter (INDEPENDENT_AMBULATORY_CARE_PROVIDER_SITE_OTHER): Payer: Self-pay

## 2021-05-31 ENCOUNTER — Ambulatory Visit (INDEPENDENT_AMBULATORY_CARE_PROVIDER_SITE_OTHER): Payer: 59 | Admitting: Family Medicine

## 2021-05-31 ENCOUNTER — Encounter (INDEPENDENT_AMBULATORY_CARE_PROVIDER_SITE_OTHER): Payer: Self-pay

## 2021-06-01 ENCOUNTER — Ambulatory Visit (INDEPENDENT_AMBULATORY_CARE_PROVIDER_SITE_OTHER): Payer: 59 | Admitting: Family Medicine

## 2021-06-01 ENCOUNTER — Other Ambulatory Visit: Payer: Self-pay

## 2021-06-01 VITALS — BP 114/72 | HR 67 | Temp 97.4°F | Ht 64.0 in | Wt 232.8 lb

## 2021-06-01 DIAGNOSIS — Z1159 Encounter for screening for other viral diseases: Secondary | ICD-10-CM

## 2021-06-01 DIAGNOSIS — E66812 Obesity, class 2: Secondary | ICD-10-CM

## 2021-06-01 DIAGNOSIS — E782 Mixed hyperlipidemia: Secondary | ICD-10-CM | POA: Diagnosis not present

## 2021-06-01 DIAGNOSIS — E6609 Other obesity due to excess calories: Secondary | ICD-10-CM

## 2021-06-01 DIAGNOSIS — Z6839 Body mass index (BMI) 39.0-39.9, adult: Secondary | ICD-10-CM

## 2021-06-01 DIAGNOSIS — E559 Vitamin D deficiency, unspecified: Secondary | ICD-10-CM | POA: Diagnosis not present

## 2021-06-01 LAB — VITAMIN D 25 HYDROXY (VIT D DEFICIENCY, FRACTURES): VITD: 27.93 ng/mL — ABNORMAL LOW (ref 30.00–100.00)
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
## 2021-06-02 ENCOUNTER — Encounter: Payer: Self-pay | Admitting: Family Medicine

## 2021-06-02 DIAGNOSIS — E559 Vitamin D deficiency, unspecified: Secondary | ICD-10-CM

## 2021-06-02 LAB — HCV INTERPRETATION

## 2021-06-02 LAB — HCV AB W REFLEX TO QUANT PCR: HCV Ab: <0.1 s/co ratio (ref 0.0–0.9)

## 2021-06-02 MED ORDER — VITAMIN D (ERGOCALCIFEROL) 1.25 MG (50000 UNIT) PO CAPS: 50000.0000 [IU] | ORAL_CAPSULE | ORAL | 3 refills | Status: DC

## 2021-06-14 ENCOUNTER — Ambulatory Visit (INDEPENDENT_AMBULATORY_CARE_PROVIDER_SITE_OTHER): Payer: 59 | Admitting: Physician Assistant

## 2021-06-14 ENCOUNTER — Other Ambulatory Visit: Payer: Self-pay

## 2021-06-14 ENCOUNTER — Encounter (INDEPENDENT_AMBULATORY_CARE_PROVIDER_SITE_OTHER): Payer: Self-pay | Admitting: Physician Assistant

## 2021-06-14 VITALS — BP 118/79 | HR 96 | Temp 98.2°F | Ht 64.0 in | Wt 227.0 lb

## 2021-06-14 DIAGNOSIS — Z6841 Body Mass Index (BMI) 40.0 and over, adult: Secondary | ICD-10-CM | POA: Diagnosis not present

## 2021-06-14 DIAGNOSIS — Z9189 Other specified personal risk factors, not elsewhere classified: Secondary | ICD-10-CM

## 2021-06-14 DIAGNOSIS — E1169 Type 2 diabetes mellitus with other specified complication: Secondary | ICD-10-CM | POA: Diagnosis not present

## 2021-06-14 MED ORDER — TIRZEPATIDE 7.5 MG/0.5ML ~~LOC~~ SOAJ: 7.5000 mg | SUBCUTANEOUS | 0 refills | Status: DC

## 2021-06-14 NOTE — Progress Notes (Signed)
Chief Complaint:   OBESITY [name] is here to discuss her progress with her obesity treatment plan along with follow-up of her obesity related diagnoses. [name] is on practicing portion control and making smarter food choices, such as increasing vegetables and decreasing simple carbohydrates and states she is following her eating plan approximately (unknown)% of the time. [name] states she is running, weight lifting, and 5K training for 45 minutes 4 times per week.  Today's visit was #: 17 Starting weight: 242 lbs Starting date: 06/25/2019 Today's weight: 227 lbs Today's date: 06/14/2021 Total lbs lost to date: 15 Total lbs lost since last in-office visit: 8  Interim History: [name] is on Mounjaro 5 mg and she has started to have some cravings. She states she is a very picky eater and she is having to supplement protein with protein shakes. Cardio and strength training was reviewed today.  Subjective:   1. Type 2 diabetes mellitus with other specified complication, without long-term current use of insulin (HCC) [name] is on Mounjaro 5 mg, and she is starting to graze more often.  2. At risk for heart disease [name] is at a higher than average risk for cardiovascular disease due to obesity.   Assessment/Plan:   1. Type 2 diabetes mellitus with other specified complication, without long-term current use of insulin ([place]) [place] agreed to increase Mounjaro to 7.5 mg q weekly with no refills. Good blood sugar control is important to decrease the likelihood of diabetic complications such as nephropathy, neuropathy, limb loss, blindness, coronary artery disease, and death. Intensive lifestyle modification including diet, exercise and weight loss are the first line of treatment for diabetes.   - tirzepatide (MOUNJARO) 7.5 MG/0.5ML Pen; Inject 7.5 mg into the skin once a week.  Dispense: 2 mL; Refill: 0  2. At risk for heart disease [name] was given approximately 15 minutes of coronary artery  disease prevention counseling today. She is 50 y.o. female and has risk factors for heart disease including obesity. We discussed intensive lifestyle modifications today with an emphasis on specific weight loss instructions and strategies.   Repetitive spaced learning was employed today to elicit superior memory formation and behavioral change.  3. Obesity, current BMI 38.95 [name] is currently in the action stage of change. As such, her goal is to continue with weight loss efforts. She has agreed to keeping a food journal and adhering to recommended goals of 1200 calories and 85 grams of protein daily.   Exercise goals: As is.  Behavioral modification strategies: increasing lean protein intake and meal planning and cooking strategies.  [name] has agreed to follow-up with our clinic in 3 weeks. She was informed of the importance of frequent follow-up visits to maximize her success with intensive lifestyle modifications for her multiple health conditions.   Objective:   Blood pressure 118/79, pulse 96, temperature 98.2 F (36.8 C), height 5\' 4"  (1.626 m), weight 227 lb (103 kg), last menstrual period 06/16/2015, SpO2 98 %. Body mass index is 38.96 kg/m.  General: Cooperative, alert, well developed, in no acute distress. HEENT: Conjunctivae and lids unremarkable. Cardiovascular: Regular rhythm.  Lungs: Normal work of breathing. Neurologic: No focal deficits.   Lab Results  Component Value Date   CREATININE 0.95 12/27/2020   BUN 12 12/27/2020   NA 138 12/27/2020   K 4.5 12/27/2020   CL 102 12/27/2020   CO2 31 12/27/2020   Lab Results  Component Value Date   ALT 16 07/13/2020   AST 20 07/13/2020  ALKPHOS 75 07/13/2020   BILITOT 0.3 07/13/2020   Lab Results  Component Value Date   HGBA1C 6.4 01/18/2021   HGBA1C 5.9 (H) 07/13/2020   HGBA1C 6.0 (H) 05/04/2020   HGBA1C 6.1 (H) 02/03/2020   HGBA1C 6.2 (H) 09/04/2019   Lab Results  Component Value Date   INSULIN 20.3  09/04/2019   INSULIN 7.6 06/25/2019   Lab Results  Component Value Date   TSH 2.950 07/13/2020   Lab Results  Component Value Date   CHOL 288 (H) 01/18/2021   HDL 41.50 01/18/2021   LDLCALC 221 (H) 01/18/2021   TRIG 125.0 01/18/2021   CHOLHDL 7 01/18/2021   Lab Results  Component Value Date   VD25OH 27.93 (L) 06/01/2021   VD25OH 28.57 (L) 01/18/2021   VD25OH 26.0 (L) 07/13/2020   Lab Results  Component Value Date   WBC 4.4 01/18/2021   HGB 12.3 01/18/2021   HCT 37.3 01/18/2021   MCV 88.6 01/18/2021   PLT 380.0 01/18/2021   Lab Results  Component Value Date   IRON 87 07/13/2020   TIBC 330 07/13/2020   FERRITIN 104 07/13/2020   Attestation Statements:   Reviewed by clinician on day of visit: allergies, medications, problem list, medical history, surgical history, family history, social history, and previous encounter notes.   Wilhemena Durie, am acting as transcriptionist for [employer], PA-C.  I have reviewed the above documentation for accuracy and completeness, and I agree with the above. Abby Potash, PA-C

## 2021-06-17 ENCOUNTER — Other Ambulatory Visit: Payer: Self-pay

## 2021-06-17 ENCOUNTER — Other Ambulatory Visit: Payer: Self-pay | Admitting: Obstetrics and Gynecology

## 2021-06-17 ENCOUNTER — Ambulatory Visit
Admission: RE | Admit: 2021-06-17 | Discharge: 2021-06-17 | Disposition: A | Discharge status: Home / Self Care | Payer: 59 | Source: Ambulatory Visit | Attending: Obstetrics and Gynecology | Admitting: Obstetrics and Gynecology

## 2021-06-17 DIAGNOSIS — Z1231 Encounter for screening mammogram for malignant neoplasm of breast: Secondary | ICD-10-CM

## 2021-06-28 ENCOUNTER — Encounter (INDEPENDENT_AMBULATORY_CARE_PROVIDER_SITE_OTHER): Payer: Self-pay | Admitting: Family Medicine

## 2021-06-28 ENCOUNTER — Other Ambulatory Visit: Payer: Self-pay

## 2021-06-28 ENCOUNTER — Ambulatory Visit (INDEPENDENT_AMBULATORY_CARE_PROVIDER_SITE_OTHER): Payer: 59 | Admitting: Family Medicine

## 2021-06-28 VITALS — BP 113/71 | HR 64 | Temp 97.9°F | Ht 64.0 in | Wt 225.0 lb

## 2021-06-28 DIAGNOSIS — Z6841 Body Mass Index (BMI) 40.0 and over, adult: Secondary | ICD-10-CM

## 2021-06-28 DIAGNOSIS — K5903 Drug induced constipation: Secondary | ICD-10-CM

## 2021-06-28 DIAGNOSIS — E1169 Type 2 diabetes mellitus with other specified complication: Secondary | ICD-10-CM | POA: Diagnosis not present

## 2021-06-28 DIAGNOSIS — E559 Vitamin D deficiency, unspecified: Secondary | ICD-10-CM

## 2021-06-28 MED ORDER — TIRZEPATIDE 10 MG/0.5ML ~~LOC~~ SOAJ: 10.0000 mg | SUBCUTANEOUS | 0 refills | Status: DC

## 2021-06-28 NOTE — Progress Notes (Signed)
Chief Complaint:   OBESITY [name] is here to discuss her progress with her obesity treatment plan along with follow-up of her obesity related diagnoses. See Medical Weight Management Flowsheet for complete bioelectrical impedance results.  Today's visit was #: 18 Starting weight: 242 lbs Starting date: 06/25/2019 Weight change since last visit: 2 lbs Total lbs lost to date: 17 lbs Total weight loss percentage to date: -7.02%  Nutrition Plan: Keeping a food journal and adhering to recommended goals of 1200 calories and 85 grams of protein daily for 85% of the time. Activity: Cardio/strength training for 90 minutes 3 times per week.  Anti-obesity medications: Mounjaro 7.5 mg subcutaneously weekly. Reported side effects: Increased constipation.  Interim History: [name] endorses having constipation.  She says she used a small amount of MiraLAX.  She has had [employer] chicken breast bites and loves them.  Assessment/Plan:   1. Type 2 diabetes mellitus with other specified complication, without long-term current use of insulin (HCC) Diabetes Mellitus: Not at goal. Medication: Mounjaro 7.5 mg subcutaneously weekly. Issues reviewed: blood sugar goals, complications of diabetes mellitus, hypoglycemia prevention and treatment, exercise, and nutrition.  Plan:  Increase Mounjaro to 10 mg subcutaneously weekly.  The patient will continue to focus on protein-rich, low simple carbohydrate foods. We reviewed the importance of hydration, regular exercise for stress reduction, and restorative sleep.   Lab Results  Component Value Date   HGBA1C 6.4 01/18/2021   HGBA1C 5.9 (H) 07/13/2020   HGBA1C 6.0 (H) 05/04/2020   Lab Results  Component Value Date   LDLCALC 221 (H) 01/18/2021   CREATININE 0.95 12/27/2020   - Increase tirzepatide (MOUNJARO) 10 MG/0.5ML Pen; Inject 10 mg into the skin once a week.  Dispense: 2 mL; Refill: 0  2. Constipation due to pain medication Counseling Getting to Good  Bowel Health: Your goal is to have one soft bowel movement each day. Drink at least 8 glasses of water each day. Eat plenty of fiber (goal is over 25 grams each day). It is best to get most of your fiber from dietary sources which includes leafy green vegetables, fresh fruit, and whole grains. You may need to add fiber with the help of OTC fiber supplements. These include Metamucil, Citrucel, and Benefiber. If you are still having trouble, try adding Miralax or Magnesium Citrate. If all of these changes do not work, contact me.  3. Vitamin D deficiency Not at goal. She is taking vitamin D 50,000 IU weekly.  Plan: Continue to take prescription Vitamin D @50 ,000 IU every week as prescribed.  Follow-up for routine testing of Vitamin D, at least 2-3 times per year to avoid over-replacement.  Lab Results  Component Value Date   VD25OH 27.93 (L) 06/01/2021   VD25OH 28.57 (L) 01/18/2021   VD25OH 26.0 (L) 07/13/2020   4. Obesity, current BMI 38.6  Course: [name] is currently in the action stage of change. As such, her goal is to continue with weight loss efforts.   Nutrition goals: She has agreed to keeping a food journal and adhering to recommended goals of 1200 calories and 85 grams of protein.   Exercise goals:  As is.  Behavioral modification strategies: increasing lean protein intake, decreasing simple carbohydrates, increasing vegetables, and increasing water intake.  Azaylea has agreed to follow-up with our clinic in 4 weeks. She was informed of the importance of frequent follow-up visits to maximize her success with intensive lifestyle modifications for her multiple health conditions.   Objective:   Blood  pressure 113/71, pulse 64, temperature 97.9 F (36.6 C), temperature source Oral, height 5\' 4"  (1.626 m), weight 225 lb (102.1 kg), last menstrual period 06/16/2015, SpO2 97 %. Body mass index is 38.62 kg/m.  General: Cooperative, alert, well developed, in no acute distress. HEENT:  Conjunctivae and lids unremarkable. Cardiovascular: Regular rhythm.  Lungs: Normal work of breathing. Neurologic: No focal deficits.   Lab Results  Component Value Date   CREATININE 0.95 12/27/2020   BUN 12 12/27/2020   NA 138 12/27/2020   K 4.5 12/27/2020   CL 102 12/27/2020   CO2 31 12/27/2020   Lab Results  Component Value Date   ALT 16 07/13/2020   AST 20 07/13/2020   ALKPHOS 75 07/13/2020   BILITOT 0.3 07/13/2020   Lab Results  Component Value Date   HGBA1C 6.4 01/18/2021   HGBA1C 5.9 (H) 07/13/2020   HGBA1C 6.0 (H) 05/04/2020   HGBA1C 6.1 (H) 02/03/2020   HGBA1C 6.2 (H) 09/04/2019   Lab Results  Component Value Date   INSULIN 20.3 09/04/2019   INSULIN 7.6 06/25/2019   Lab Results  Component Value Date   TSH 2.950 07/13/2020   Lab Results  Component Value Date   CHOL 288 (H) 01/18/2021   HDL 41.50 01/18/2021   LDLCALC 221 (H) 01/18/2021   TRIG 125.0 01/18/2021   CHOLHDL 7 01/18/2021   Lab Results  Component Value Date   VD25OH 27.93 (L) 06/01/2021   VD25OH 28.57 (L) 01/18/2021   VD25OH 26.0 (L) 07/13/2020   Lab Results  Component Value Date   WBC 4.4 01/18/2021   HGB 12.3 01/18/2021   HCT 37.3 01/18/2021   MCV 88.6 01/18/2021   PLT 380.0 01/18/2021   Lab Results  Component Value Date   IRON 87 07/13/2020   TIBC 330 07/13/2020   FERRITIN 104 07/13/2020   Attestation Statements:   Reviewed by clinician on day of visit: allergies, medications, problem list, medical history, surgical history, family history, social history, and previous encounter notes.  I, Water quality scientist, CMA, am acting as transcriptionist for Briscoe Deutscher, DO  I have reviewed the above documentation for accuracy and completeness, and I agree with the above. -  Briscoe Deutscher, DO, MS, FAAFP, DABOM - Family and Bariatric Medicine.

## 2021-07-19 ENCOUNTER — Ambulatory Visit (INDEPENDENT_AMBULATORY_CARE_PROVIDER_SITE_OTHER): Payer: 59 | Admitting: Family Medicine

## 2021-07-19 ENCOUNTER — Other Ambulatory Visit: Payer: Self-pay

## 2021-07-19 ENCOUNTER — Encounter (INDEPENDENT_AMBULATORY_CARE_PROVIDER_SITE_OTHER): Payer: Self-pay | Admitting: Family Medicine

## 2021-07-19 VITALS — BP 108/69 | HR 76 | Temp 97.8°F | Ht 64.0 in | Wt 217.0 lb

## 2021-07-19 DIAGNOSIS — E1169 Type 2 diabetes mellitus with other specified complication: Secondary | ICD-10-CM | POA: Diagnosis not present

## 2021-07-19 DIAGNOSIS — E7800 Pure hypercholesterolemia, unspecified: Secondary | ICD-10-CM | POA: Diagnosis not present

## 2021-07-19 DIAGNOSIS — K5903 Drug induced constipation: Secondary | ICD-10-CM | POA: Diagnosis not present

## 2021-07-19 DIAGNOSIS — Z6841 Body Mass Index (BMI) 40.0 and over, adult: Secondary | ICD-10-CM

## 2021-07-19 MED ORDER — TIRZEPATIDE 10 MG/0.5ML ~~LOC~~ SOAJ: 10.0000 mg | SUBCUTANEOUS | 0 refills | Status: DC

## 2021-07-19 MED ORDER — TIRZEPATIDE 12.5 MG/0.5ML ~~LOC~~ SOAJ: 12.5000 mg | SUBCUTANEOUS | 0 refills | Status: DC

## 2021-07-19 NOTE — Progress Notes (Signed)
Chief Complaint:   OBESITY [name] is here to discuss her progress with her obesity treatment plan along with follow-up of her obesity related diagnoses. See Medical Weight Management Flowsheet for complete bioelectrical impedance results.  Today's visit was #: 12 Starting weight: 242 lbs Starting date: 06/25/2019 Weight change since last visit: 8 lbs Total lbs lost to date: 25 lbs Total weight loss percentage to date: -10.33%  Nutrition Plan: Keeping a food journal and adhering to recommended goals of 1200 calories and 85 grams of protein daily for 98% of the time. Activity: Cardio for 40 minutes 3-4 times per week. Anti-obesity medications: Mounjaro 10 mg subcutaneously weekly. Reported side effects: None.  Interim History: [name] will be doing the 67 Hard Challenge with a Writer.  She will be reading "The Power of Positive Thinking".  She says she is eating a healthy diet and drinking 64-84 ounces of water daily.  She is also taking a selfie everyday.  She reports that she has been focused on protein.  She recently completed the [place] [employer] [place] 5K.  Assessment/Plan:   1. Type 2 diabetes mellitus with other specified complication, without long-term current use of insulin (HCC) Diabetes Mellitus: Controlled. Medication: Mounjaro 10 mg subcutaneously weekly. Issues reviewed: blood sugar goals, complications of diabetes mellitus, hypoglycemia prevention and treatment, exercise, and nutrition.  Plan:  Continue Mounjaro 10 mg subcutaneously weekly.  Will order 12.5 mg dose now as well. The patient will continue to focus on protein-rich, low simple carbohydrate foods. We reviewed the importance of hydration, regular exercise for stress reduction, and restorative sleep.   Lab Results  Component Value Date   HGBA1C 6.4 01/18/2021   HGBA1C 5.9 (H) 07/13/2020   HGBA1C 6.0 (H) 05/04/2020   Lab Results  Component Value Date   LDLCALC 221 (H) 01/18/2021   CREATININE  0.95 12/27/2020   - Continue tirzepatide (MOUNJARO) 10 MG/0.5ML Pen; Inject 10 mg into the skin once a week.  Dispense: 2 mL; Refill: 0 - tirzepatide (MOUNJARO) 12.5 MG/0.5ML Pen; Inject 12.5 mg into the skin once a week.  Dispense: 2 mL; Refill: 0  2. Drug-induced constipation Hunter started [employer] 10 mg last week.  She has had less constipation by increasing water intake along with taking a stool softener, Calm supplement, and increasing fiber intake.  3. Pure hypercholesterolemia Course: Not at goal. Lipid-lowering medications: None.   Plan: Dietary changes: Increase soluble fiber, decrease simple carbohydrates, decrease saturated fat. Exercise changes: Moderate to vigorous-intensity aerobic activity 150 minutes per week or as tolerated. We will continue to monitor along with PCP/specialists as it pertains to her weight loss journey.  Lab Results  Component Value Date   CHOL 288 (H) 01/18/2021   HDL 41.50 01/18/2021   LDLCALC 221 (H) 01/18/2021   TRIG 125.0 01/18/2021   CHOLHDL 7 01/18/2021   Lab Results  Component Value Date   ALT 16 07/13/2020   AST 20 07/13/2020   ALKPHOS 75 07/13/2020   BILITOT 0.3 07/13/2020   The 10-year ASCVD risk score (Arnett DK, et al., 2019) is: 5.3%   Values used to calculate the score:     Age: 50 years     Sex: Female     Is Non-Hispanic African American: Yes     Diabetic: Yes     Tobacco smoker: No     Systolic Blood Pressure: 539 mmHg     Is BP treated: No     HDL Cholesterol: 41.5 mg/dL  Total Cholesterol: 288 mg/dL  4. Obesity, current BMI 37.4  Course: [name] is currently in the action stage of change. As such, her goal is to continue with weight loss efforts.   Nutrition goals: She has agreed to keeping a food journal and adhering to recommended goals of 1200 calories and 85 grams of protein.   Exercise goals:  As is.  Behavioral modification strategies: increasing lean protein intake, decreasing simple carbohydrates,  increasing vegetables, and increasing water intake.  [name] has agreed to follow-up with our clinic in 4 weeks. She was informed of the importance of frequent follow-up visits to maximize her success with intensive lifestyle modifications for her multiple health conditions.   Objective:   Blood pressure 108/69, pulse 76, temperature 97.8 F (36.6 C), temperature source Oral, height 5\' 4"  (1.626 m), weight 217 lb (98.4 kg), last menstrual period 06/16/2015, SpO2 98 %. Body mass index is 37.25 kg/m.  General: Cooperative, alert, well developed, in no acute distress. HEENT: Conjunctivae and lids unremarkable. Cardiovascular: Regular rhythm.  Lungs: Normal work of breathing. Neurologic: No focal deficits.   Lab Results  Component Value Date   CREATININE 0.95 12/27/2020   BUN 12 12/27/2020   NA 138 12/27/2020   K 4.5 12/27/2020   CL 102 12/27/2020   CO2 31 12/27/2020   Lab Results  Component Value Date   ALT 16 07/13/2020   AST 20 07/13/2020   ALKPHOS 75 07/13/2020   BILITOT 0.3 07/13/2020   Lab Results  Component Value Date   HGBA1C 6.4 01/18/2021   HGBA1C 5.9 (H) 07/13/2020   HGBA1C 6.0 (H) 05/04/2020   HGBA1C 6.1 (H) 02/03/2020   HGBA1C 6.2 (H) 09/04/2019   Lab Results  Component Value Date   INSULIN 20.3 09/04/2019   INSULIN 7.6 06/25/2019   Lab Results  Component Value Date   TSH 2.950 07/13/2020   Lab Results  Component Value Date   CHOL 288 (H) 01/18/2021   HDL 41.50 01/18/2021   LDLCALC 221 (H) 01/18/2021   TRIG 125.0 01/18/2021   CHOLHDL 7 01/18/2021   Lab Results  Component Value Date   VD25OH 27.93 (L) 06/01/2021   VD25OH 28.57 (L) 01/18/2021   VD25OH 26.0 (L) 07/13/2020   Lab Results  Component Value Date   WBC 4.4 01/18/2021   HGB 12.3 01/18/2021   HCT 37.3 01/18/2021   MCV 88.6 01/18/2021   PLT 380.0 01/18/2021   Lab Results  Component Value Date   IRON 87 07/13/2020   TIBC 330 07/13/2020   FERRITIN 104 07/13/2020   Attestation  Statements:   Reviewed by clinician on day of visit: allergies, medications, problem list, medical history, surgical history, family history, social history, and previous encounter notes.  I, Water quality scientist, CMA, am acting as transcriptionist for Briscoe Deutscher, DO  I have reviewed the above documentation for accuracy and completeness, and I agree with the above. -  Briscoe Deutscher, DO, MS, FAAFP, DABOM - Family and Bariatric Medicine.

## 2021-08-11 ENCOUNTER — Encounter (INDEPENDENT_AMBULATORY_CARE_PROVIDER_SITE_OTHER): Payer: Self-pay | Admitting: Family Medicine

## 2021-08-11 ENCOUNTER — Other Ambulatory Visit: Payer: Self-pay

## 2021-08-11 ENCOUNTER — Ambulatory Visit (INDEPENDENT_AMBULATORY_CARE_PROVIDER_SITE_OTHER): Payer: 59 | Admitting: Family Medicine

## 2021-08-11 VITALS — BP 132/72 | HR 81 | Temp 97.8°F | Ht 64.0 in | Wt 213.0 lb

## 2021-08-11 DIAGNOSIS — F418 Other specified anxiety disorders: Secondary | ICD-10-CM

## 2021-08-11 DIAGNOSIS — E559 Vitamin D deficiency, unspecified: Secondary | ICD-10-CM

## 2021-08-11 DIAGNOSIS — E1169 Type 2 diabetes mellitus with other specified complication: Secondary | ICD-10-CM

## 2021-08-11 DIAGNOSIS — Z6841 Body Mass Index (BMI) 40.0 and over, adult: Secondary | ICD-10-CM

## 2021-08-11 MED ORDER — TIRZEPATIDE 12.5 MG/0.5ML ~~LOC~~ SOAJ: 12.5000 mg | SUBCUTANEOUS | 0 refills | Status: DC

## 2021-08-12 NOTE — Progress Notes (Signed)
Chief Complaint:   OBESITY [name] is here to discuss her progress with her obesity treatment plan along with follow-up of her obesity related diagnoses. See Medical Weight Management Flowsheet for complete bioelectrical impedance results.  Today's visit was #: 20 Starting weight: 242 lbs Starting date: 06/25/2019 Weight change since last visit: 4 lbs Total lbs lost to date: 29 lbs Total weight loss percentage to date: -11.98%  Nutrition Plan: Keeping a food journal and adhering to recommended goals of 1200 calories and 85 grams of protein daily for 95% of the time. Activity: Cardio/strength training for 45 minutes 4 times per week.  Anti-obesity medications: Mounjaro 12.5 mg subcutaneously weekly. Reported side effects: None.  Interim History: [name] has been going to couples counseling.  She says she is thinking about breaking up with her boyfriend.  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  She says she is happy with her continued weight loss.  Assessment/Plan:   1. Type 2 diabetes mellitus with other specified complication, without long-term current use of insulin (HCC) Diabetes Mellitus: Not at goal. Medication: Mounjaro 12.5 mg subcutaneously weekly. Issues reviewed: blood sugar goals, complications of diabetes mellitus, hypoglycemia prevention and treatment, exercise, and nutrition.  Plan: Continue Mounjaro 12.5 mg subcutaneously weekly.  Will refill today. The patient will continue to focus on protein-rich, low simple carbohydrate foods. We reviewed the importance of hydration, regular exercise for stress reduction, and restorative sleep.   Lab Results  Component Value Date   HGBA1C 6.4 01/18/2021   HGBA1C 5.9 (H) 07/13/2020   HGBA1C 6.0 (H) 05/04/2020   Lab Results  Component Value Date   LDLCALC 221 (H) 01/18/2021   CREATININE 0.95 12/27/2020   - Refill tirzepatide (MOUNJARO) 12.5 MG/0.5ML Pen; Inject 12.5 mg into the skin once a week.  Dispense: 2 mL; Refill: 0  2.  Situational anxiety [name] is currently in couples counseling and is considering breaking up with her boyfriend. Behavior modification techniques were discussed today to help deal with emotional/non-hunger eating behaviors.  3. Vitamin D deficiency Not at goal. She is taking vitamin D 50,000 IU weekly.  Plan: Continue to take prescription Vitamin D @50 ,000 IU every week as prescribed.  Follow-up for routine testing of Vitamin D, at least 2-3 times per year to avoid over-replacement.  Lab Results  Component Value Date   VD25OH 27.93 (L) 06/01/2021   VD25OH 28.57 (L) 01/18/2021   VD25OH 26.0 (L) 07/13/2020   4. Obesity, current BMI 36.6  Course: [name] is currently in the action stage of change. As such, her goal is to continue with weight loss efforts.   Nutrition goals: She has agreed to keeping a food journal and adhering to recommended goals of 1200 calories and 85 grams of protein.   Exercise goals:  As is.  Behavioral modification strategies: increasing lean protein intake, decreasing simple carbohydrates, increasing vegetables, and increasing water intake.  Mea has agreed to follow-up with our clinic in 4 weeks. She was informed of the importance of frequent follow-up visits to maximize her success with intensive lifestyle modifications for her multiple health conditions.   Objective:   Blood pressure 132/72, pulse 81, temperature 97.8 F (36.6 C), temperature source Oral, height 5\' 4"  (1.626 m), weight 213 lb (96.6 kg), last menstrual period 06/16/2015, SpO2 95 %. Body mass index is 36.56 kg/m.  General: Cooperative, alert, well developed, in no acute distress. HEENT: Conjunctivae and lids unremarkable. Cardiovascular: Regular rhythm.  Lungs: Normal work of breathing. Neurologic: No focal deficits.  Lab Results  Component Value Date   CREATININE 0.95 12/27/2020   BUN 12 12/27/2020   NA 138 12/27/2020   K 4.5 12/27/2020   CL 102 12/27/2020   CO2 31 12/27/2020    Lab Results  Component Value Date   ALT 16 07/13/2020   AST 20 07/13/2020   ALKPHOS 75 07/13/2020   BILITOT 0.3 07/13/2020   Lab Results  Component Value Date   HGBA1C 6.4 01/18/2021   HGBA1C 5.9 (H) 07/13/2020   HGBA1C 6.0 (H) 05/04/2020   HGBA1C 6.1 (H) 02/03/2020   HGBA1C 6.2 (H) 09/04/2019   Lab Results  Component Value Date   INSULIN 20.3 09/04/2019   INSULIN 7.6 06/25/2019   Lab Results  Component Value Date   TSH 2.950 07/13/2020   Lab Results  Component Value Date   CHOL 288 (H) 01/18/2021   HDL 41.50 01/18/2021   LDLCALC 221 (H) 01/18/2021   TRIG 125.0 01/18/2021   CHOLHDL 7 01/18/2021   Lab Results  Component Value Date   VD25OH 27.93 (L) 06/01/2021   VD25OH 28.57 (L) 01/18/2021   VD25OH 26.0 (L) 07/13/2020   Lab Results  Component Value Date   WBC 4.4 01/18/2021   HGB 12.3 01/18/2021   HCT 37.3 01/18/2021   MCV 88.6 01/18/2021   PLT 380.0 01/18/2021   Lab Results  Component Value Date   IRON 87 07/13/2020   TIBC 330 07/13/2020   FERRITIN 104 07/13/2020   Attestation Statements:   Reviewed by clinician on day of visit: allergies, medications, problem list, medical history, surgical history, family history, social history, and previous encounter notes.  I, Water quality scientist, CMA, am acting as transcriptionist for Briscoe Deutscher, DO  I have reviewed the above documentation for accuracy and completeness, and I agree with the above. -  Briscoe Deutscher, DO, MS, FAAFP, DABOM - Family and Bariatric Medicine.

## 2021-08-18 ENCOUNTER — Encounter (INDEPENDENT_AMBULATORY_CARE_PROVIDER_SITE_OTHER): Payer: Self-pay

## 2021-08-19 NOTE — Progress Notes (Deleted)
NEUROLOGY FOLLOW UP OFFICE NOTE  [name] [id number]  Assessment/Plan:    Migraine with aura, without status migrainosus, not intractable Primary exercise headache Chronic daily headache   1.Indomethacin 25-50mg  30-60 minutes prior to exercise. 2.***     Subjective:  [name] is a 51 year old right-handed female with elevated blood pressure, HLD, ADD and depression/anxiety who presents for migraines.  History supplemented by Urgent Care and ED notes.   UPDATE: CTA head and neck on 03/03/2021 personally reviewed was limited due to streak artifact from venous contamination but overall was unremarkable.   Intensity:  *** Duration:  *** Frequency:  *** Frequency of abortive medication: *** Current NSAIDS/analgesics:  Indomethacin 25-50mg  30-60 minutes prior to exercise, ASA 81mg  QD Current triptans:  none Current ergotamine:  none Current anti-emetic:  none Current muscle relaxants:  none Current Antihypertensive medications:  none Current Antidepressant medications:  Wellbutrin Current Anticonvulsant medications:  none Current anti-CGRP:  none Current Vitamins/Herbal/Supplements:  Mg, MVI, D, B complex Current Antihistamines/Decongestants:  none Other therapy:  none Hormone/birth control:  none  Caffeine:  No caffeine Diet:  64 oz water Exercise:  Yes Depression:  no; Anxiety:  yes Other pain:  no Sleep hygiene:  3 hours but able to function  HISTORY:  She has history of migraines since at least 2013.  She had an MRI of brain with and without contrast on 02/14/2012 and CT head on 09/26/2016 which were unremarkable.  Typically they are frontal/periorbital/maxillary and would occur with exertional activity.  She also keeps a persistent low-grade pressure headache.  In April 2022, she had a retinal hemorrhage in her right eye.  She started having more severe headaches, occurring outside of exercise.  She would experience a flash and floaters in her right eye  followed by severe stabbing pain from the left side of her head to the right side and then may travel to other areas of her head.  There is associated dizziness and sensation of leaning to one side.  No nausea, photophobia, phonophobia or unilateral numbness or weakness.  It was persistent for days.  Advil would dull the pain but Excedrin and Aleve wouldn't touch it.  Zomig tablet may dull it after 45 minutes.  She had an MRI of the brain performed yesterday which was personally reviewed and was unremarkable (single punctate T2 hyperintense focus seen in the left frontal subcortical white matter, also noted on imaging from 2013).  She does workout with a personal trainer 3 days a week.  Whenever she would start exercising, a severe headache would occur.  She has stopped until further notice.  Over the past week, she only had about 2 severe headaches.  She takes Tylenol or other OTC analgesic about 3 days a week.      Past NSAIDS/analgesics:  naproxen Past abortive triptans:  Zomig tab Past abortive ergotamine:  none Past muscle relaxants:  none Past anti-emetic:  none Past antihypertensive medications:  none Past antidepressant medications:  amitriptyline, Vyvanse Past anticonvulsant medications:  none Past anti-CGRP:  none Past vitamins/Herbal/Supplements:  none Past antihistamines/decongestants:  Benadryl Other past therapies:  none     PAST MEDICAL HISTORY: Past Medical History:  Diagnosis Date   ADD (attention deficit disorder)    Anxiety    Back pain    Blood pressure elevated without history of HTN    Chest pain    Constipation    Depression    GERD (gastroesophageal reflux disease)    HLD (hyperlipidemia)  Iron deficiency anemia due to chronic blood loss 10/15/2015   Migraine    Morbid obesity ([place]) 03/20/2017   Swallowing difficulty    Uterine leiomyoma 07/06/2015   Vitamin D deficiency     MEDICATIONS: Current Outpatient Medications on File Prior to Visit  Medication  Sig Dispense Refill   aspirin EC 81 MG tablet Take 81 mg by mouth daily.     b complex vitamins tablet Take 1 tablet by mouth daily.     Cyanocobalamin 1000 MCG CAPS Take 1 capsule by mouth daily.     magnesium 30 MG tablet Take 30 mg by mouth daily.     Multiple Vitamins-Minerals ([place]) TBEF Take 1 tablet by mouth every 3 (three) days.     tirzepatide (MOUNJARO) 12.5 MG/0.5ML Pen Inject 12.5 mg into the skin once a week. 2 mL 0   Vitamin D, Ergocalciferol, (DRISDOL) 1.25 MG (50000 UNIT) CAPS capsule Take 1 capsule (50,000 Units total) by mouth every 7 (seven) days. 5 capsule 3   ZOLMitriptan (ZOMIG) 2.5 MG tablet Take 1 tablet (2.5 mg total) by mouth once for 1 dose. May repeat in 2 hours if headache persists or recurs. 10 tablet 0   No current facility-administered medications on file prior to visit.    ALLERGIES: Allergies  Allergen Reactions   Amoxicillin     Pt does not remember reaction   Penicillins Other (See Comments)    Childhood rxn Has patient had a PCN reaction causing immediate rash, facial/tongue/throat swelling, SOB or lightheadedness with hypotension: No Has patient had a PCN reaction causing severe rash involving mucus membranes or skin necrosis: No Has patient had a PCN reaction that required hospitalization No Has patient had a PCN reaction occurring within the last 10 years: No If all of the above answers are "NO", then may proceed with Cephalosporin use.     FAMILY HISTORY: Family History  Problem Relation Age of Onset   Breast cancer Maternal Aunt 90   Breast cancer Cousin 50   Diabetes Mother    Hypertension Mother    High Cholesterol Mother    Depression Mother    Sleep apnea Mother    Obesity Mother    Heart disease Father    Hypertension Father    Hypertension Sister    Heart attack Maternal Grandmother    Diabetes Maternal Grandmother       Objective:  *** General: No acute distress.  Patient appears ***-groomed.   Head:   Normocephalic/atraumatic Eyes:  Fundi examined but not visualized Neck: supple, no paraspinal tenderness, full range of motion Heart:  Regular rate and rhythm Lungs:  Clear to auscultation bilaterally Back: No paraspinal tenderness Neurological Exam: alert and oriented to person, place, and time.  Speech fluent and not dysarthric, language intact.  CN II-XII intact. Bulk and tone normal, muscle strength 5/5 throughout.  Sensation to light touch intact.  Deep tendon reflexes 2+ throughout, toes downgoing.  Finger to nose testing intact.  Gait normal, Romberg negative.   Metta Clines, DO  CC: ***

## 2021-08-20 ENCOUNTER — Ambulatory Visit: Payer: 59 | Admitting: Neurology

## 2021-09-01 ENCOUNTER — Other Ambulatory Visit: Payer: Self-pay

## 2021-09-02 ENCOUNTER — Ambulatory Visit (INDEPENDENT_AMBULATORY_CARE_PROVIDER_SITE_OTHER): Payer: 59 | Admitting: Family Medicine

## 2021-09-02 VITALS — BP 118/68 | HR 75 | Temp 97.1°F | Ht 64.0 in | Wt 211.0 lb

## 2021-09-02 DIAGNOSIS — R7303 Prediabetes: Secondary | ICD-10-CM

## 2021-09-02 DIAGNOSIS — E782 Mixed hyperlipidemia: Secondary | ICD-10-CM

## 2021-09-02 DIAGNOSIS — Z6836 Body mass index (BMI) 36.0-36.9, adult: Secondary | ICD-10-CM

## 2021-09-02 DIAGNOSIS — E559 Vitamin D deficiency, unspecified: Secondary | ICD-10-CM

## 2021-09-02 LAB — LIPID PANEL
Cholesterol: 250 mg/dL — ABNORMAL HIGH (ref 0–200)
HDL: 39 mg/dL — ABNORMAL LOW (ref >39.00)
LDL Cholesterol: 194 mg/dL — ABNORMAL HIGH (ref 0–99)
NonHDL: 211.48
Total CHOL/HDL Ratio: 6
Triglycerides: 88 mg/dL (ref 0.0–149.0)
VLDL: 17.6 mg/dL (ref 0.0–40.0)

## 2021-09-02 LAB — GLUCOSE, RANDOM: Glucose, Bld: 80 mg/dL (ref 70–99)

## 2021-09-02 LAB — VITAMIN D 25 HYDROXY (VIT D DEFICIENCY, FRACTURES): VITD: 52.74 ng/mL (ref 30.00–100.00)

## 2021-09-02 LAB — HEMOGLOBIN A1C: Hgb A1c MFr Bld: 5.7 % (ref 4.6–6.5)
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
## 2021-09-13 ENCOUNTER — Other Ambulatory Visit: Payer: Self-pay

## 2021-09-13 ENCOUNTER — Ambulatory Visit (INDEPENDENT_AMBULATORY_CARE_PROVIDER_SITE_OTHER): Payer: 59 | Admitting: Family Medicine

## 2021-09-13 ENCOUNTER — Encounter (INDEPENDENT_AMBULATORY_CARE_PROVIDER_SITE_OTHER): Payer: Self-pay | Admitting: Family Medicine

## 2021-09-13 VITALS — BP 134/70 | HR 68 | Temp 98.2°F | Ht 64.0 in | Wt 207.0 lb

## 2021-09-13 DIAGNOSIS — Z6835 Body mass index (BMI) 35.0-35.9, adult: Secondary | ICD-10-CM

## 2021-09-13 DIAGNOSIS — E7800 Pure hypercholesterolemia, unspecified: Secondary | ICD-10-CM

## 2021-09-13 DIAGNOSIS — R6882 Decreased libido: Secondary | ICD-10-CM | POA: Diagnosis not present

## 2021-09-13 DIAGNOSIS — E669 Obesity, unspecified: Secondary | ICD-10-CM

## 2021-09-13 DIAGNOSIS — G4709 Other insomnia: Secondary | ICD-10-CM | POA: Diagnosis not present

## 2021-09-13 MED ORDER — TRAZODONE HCL 50 MG PO TABS: 25.0000 mg | ORAL_TABLET | Freq: Every evening | ORAL | 2 refills | Status: DC | PRN

## 2021-09-14 NOTE — Progress Notes (Signed)
Chief Complaint:   OBESITY [name] is here to discuss her progress with her obesity treatment plan along with follow-up of her obesity related diagnoses. See Medical Weight Management Flowsheet for complete bioelectrical impedance results.  Today's visit was #: 21 Starting weight: 242 lbs Starting date: 06/25/2019 Weight change since last visit: 6 lbs Total lbs lost to date: 35 lbs Total weight loss percentage to date: -14.46%  Nutrition Plan: Keeping a food journal and adhering to recommended goals of 1200 calories and 85 grams of protein daily for 99% of the time. Activity: Walking/cardio/strength training for 45-60 minutes 4 times per week. Anti-obesity medications: Mounjaro 12.5 mg subcutaneously weekly. Reported side effects: None.  Interim History: [name] says she has not been sleeping well.  She averages 3-4 hours of sleep per night.  She says she has trouble falling asleep.  RHR elevated slightly 60s-70s (restarted ashwagandha).  She started Mounjaro 12.5 mg last week.  She says she is struggling to get water in.  She is down to a size 14 in jeans.  Her LDL is 190.  She will [facility].  Assessment/Plan:   1. Other insomnia This is poorly controlled. Dysfunction: difficulty falling asleep. Average hours of sleep per night: 3-4. Current treatment: None.    Plan:  Start trazodone 25-50 mg at bedtime as needed for sleep.  Recommend sleep hygiene measures including regular sleep schedule, optimal sleep environment, and relaxing presleep rituals.   - Start traZODone (DESYREL) 50 MG tablet; Take 0.5-1 tablets (25-50 mg total) by mouth at bedtime as needed for sleep.  Dispense: 30 tablet; Refill: 2  2. Low libido Denies vasomotor symptoms. She says she feels connected to her partner.  Discussed sexual health.  Discussed medications.  Plan:  Will start with focusing on sleep, self care, body image.   3. Pure hypercholesterolemia Course: Improved, but still  high after weight loss. Lipid-lowering medications: None.  We discussed the [facility] Clinic with Dr. Debara Pickett.  Plan: Dietary changes: Increase soluble fiber, decrease simple carbohydrates, decrease saturated fat. Exercise changes: Moderate to vigorous-intensity aerobic activity 150 minutes per week or as tolerated. We will continue to monitor along with PCP/specialists as it pertains to her weight loss journey.  Lab Results  Component Value Date   CHOL 250 (H) 09/02/2021   HDL 39.00 (L) 09/02/2021   LDLCALC 194 (H) 09/02/2021   TRIG 88.0 09/02/2021   CHOLHDL 6 09/02/2021   Lab Results  Component Value Date   ALT 16 07/13/2020   AST 20 07/13/2020   ALKPHOS 75 07/13/2020   BILITOT 0.3 07/13/2020   The 10-year ASCVD risk score (Arnett DK, et al., 2019) is: 11.4%   Values used to calculate the score:     Age: 51 years     Sex: Female     Is Non-Hispanic African American: Yes     Diabetic: Yes     Tobacco smoker: No     Systolic Blood Pressure: 485 mmHg     Is BP treated: No     HDL Cholesterol: 39 mg/dL     Total Cholesterol: 250 mg/dL  4. Obesity, current BMI 35.58  Course: [name] is currently in the action stage of change. As such, her goal is to continue with weight loss efforts.   Nutrition goals: She has agreed to keeping a food journal and adhering to recommended goals of 1200 calories and 85 grams of protein.   Exercise goals:  As is.  Behavioral modification  strategies: increasing lean protein intake, decreasing simple carbohydrates, increasing vegetables, and increasing water intake.  [name] has agreed to follow-up with our clinic in 4 weeks. She was informed of the importance of frequent follow-up visits to maximize her success with intensive lifestyle modifications for her multiple health conditions.   Objective:   Blood pressure 134/70, pulse 68, temperature 98.2 F (36.8 C), temperature source Oral, height 5\' 4"  (1.626 m), weight 207 lb (93.9 kg), last menstrual period  06/16/2015, SpO2 98 %. Body mass index is 35.53 kg/m.  General: Cooperative, alert, well developed, in no acute distress. HEENT: Conjunctivae and lids unremarkable. Cardiovascular: Regular rhythm.  Lungs: Normal work of breathing. Neurologic: No focal deficits.   Lab Results  Component Value Date   CREATININE 0.95 12/27/2020   BUN 12 12/27/2020   NA 138 12/27/2020   K 4.5 12/27/2020   CL 102 12/27/2020   CO2 31 12/27/2020   Lab Results  Component Value Date   ALT 16 07/13/2020   AST 20 07/13/2020   ALKPHOS 75 07/13/2020   BILITOT 0.3 07/13/2020   Lab Results  Component Value Date   HGBA1C 5.7 09/02/2021   HGBA1C 6.4 01/18/2021   HGBA1C 5.9 (H) 07/13/2020   HGBA1C 6.0 (H) 05/04/2020   HGBA1C 6.1 (H) 02/03/2020   Lab Results  Component Value Date   INSULIN 20.3 09/04/2019   INSULIN 7.6 06/25/2019   Lab Results  Component Value Date   TSH 2.950 07/13/2020   Lab Results  Component Value Date   CHOL 250 (H) 09/02/2021   HDL 39.00 (L) 09/02/2021   LDLCALC 194 (H) 09/02/2021   TRIG 88.0 09/02/2021   CHOLHDL 6 09/02/2021   Lab Results  Component Value Date   VD25OH 52.74 09/02/2021   VD25OH 27.93 (L) 06/01/2021   VD25OH 28.57 (L) 01/18/2021   Lab Results  Component Value Date   WBC 4.4 01/18/2021   HGB 12.3 01/18/2021   HCT 37.3 01/18/2021   MCV 88.6 01/18/2021   PLT 380.0 01/18/2021   Lab Results  Component Value Date   IRON 87 07/13/2020   TIBC 330 07/13/2020   FERRITIN 104 07/13/2020   Attestation Statements:   Reviewed by clinician on day of visit: allergies, medications, problem list, medical history, surgical history, family history, social history, and previous encounter notes.  I, Water quality scientist, CMA, am acting as transcriptionist for Briscoe Deutscher, DO  I have reviewed the above documentation for accuracy and completeness, and I agree with the above. -  Briscoe Deutscher, DO, MS, FAAFP, DABOM - Family and Bariatric Medicine.

## 2021-10-18 ENCOUNTER — Encounter (INDEPENDENT_AMBULATORY_CARE_PROVIDER_SITE_OTHER): Payer: Self-pay | Admitting: Family Medicine

## 2021-10-18 ENCOUNTER — Other Ambulatory Visit: Payer: Self-pay

## 2021-10-18 ENCOUNTER — Ambulatory Visit (INDEPENDENT_AMBULATORY_CARE_PROVIDER_SITE_OTHER): Payer: 59 | Admitting: Family Medicine

## 2021-10-18 VITALS — BP 105/63 | HR 68 | Temp 97.9°F | Ht 64.0 in | Wt 202.0 lb

## 2021-10-18 DIAGNOSIS — G4709 Other insomnia: Secondary | ICD-10-CM

## 2021-10-18 DIAGNOSIS — E1169 Type 2 diabetes mellitus with other specified complication: Secondary | ICD-10-CM | POA: Diagnosis not present

## 2021-10-18 DIAGNOSIS — E66813 Obesity, class 3: Secondary | ICD-10-CM

## 2021-10-18 DIAGNOSIS — E669 Obesity, unspecified: Secondary | ICD-10-CM

## 2021-10-18 DIAGNOSIS — Z6834 Body mass index (BMI) 34.0-34.9, adult: Secondary | ICD-10-CM

## 2021-10-18 DIAGNOSIS — N951 Menopausal and female climacteric states: Secondary | ICD-10-CM

## 2021-10-18 DIAGNOSIS — Z7985 Long-term (current) use of injectable non-insulin antidiabetic drugs: Secondary | ICD-10-CM

## 2021-10-20 NOTE — Progress Notes (Signed)
Chief Complaint:   OBESITY [name] is here to discuss her progress with her obesity treatment plan along with follow-up of her obesity related diagnoses. See Medical Weight Management Flowsheet for complete bioelectrical impedance results.  Today's visit was #: 22 Starting weight: 242 lbs Starting date: 06/25/2019 Weight change since last visit: 5 lbs Total lbs lost to date: 40 lbs Total weight loss percentage to date: -16.53%  Nutrition Plan: Keeping a food journal and adhering to recommended goals of 1200 calories and 85 grams of protein daily for 85% of the time. Activity: Cardio for 45 minutes 7 days per week. Anti-obesity medications: Mounjaro 12.5 mg subcutaneously weekly. Reported side effects: None.  Interim History: Labs show [name] to be perimenopausal per GYN.  She has had a partial hysterectomy. She says she has "1.5" ovaries.  She is interested in estrogen replacement.  Assessment/Plan:   1. Type 2 diabetes mellitus with other specified complication, without long-term current use of insulin (HCC) Diabetes Mellitus: At goal. Medication: Mounjaro 12.5 mg subcutaneously weekly. Issues reviewed: blood sugar goals, complications of diabetes mellitus, hypoglycemia prevention and treatment, exercise, and nutrition.  Plan: Continue Mounjaro 12.5 mg subcutaneously weekly.  The patient will continue to focus on protein-rich, low simple carbohydrate foods. We reviewed the importance of hydration, regular exercise for stress reduction, and restorative sleep.   Lab Results  Component Value Date   HGBA1C 5.7 09/02/2021   HGBA1C 6.4 01/18/2021   HGBA1C 5.9 (H) 07/13/2020   Lab Results  Component Value Date   LDLCALC 194 (H) 09/02/2021   CREATININE 0.95 12/27/2020   2. Perimenopausal Denies vasomotor symptoms.  She is interested in estrogen replacement.  3. Other insomnia This is moderately controlled. Dysfunction: difficulty falling asleep.  Current treatment: trazodone  25-50 mg at bedtime as needed for sleep.  Plan:  Continue trazodone. Recommend sleep hygiene measures including regular sleep schedule, optimal sleep environment, and relaxing presleep rituals.   4. Obesity, current BMI 34.8  Course: Illiana is currently in the action stage of change. As such, her goal is to continue with weight loss efforts.   Nutrition goals: She has agreed to keeping a food journal and adhering to recommended goals of 1200 calories and 85 grams of protein.   Exercise goals:  As is.  Behavioral modification strategies: increasing lean protein intake, decreasing simple carbohydrates, increasing vegetables, and increasing water intake.  [name] has agreed to follow-up with our clinic in 4 weeks. She was informed of the importance of frequent follow-up visits to maximize her success with intensive lifestyle modifications for her multiple health conditions.   Objective:   Blood pressure 105/63, pulse 68, temperature 97.9 F (36.6 C), temperature source Oral, height '5\' 4"'$  (1.626 m), weight 202 lb (91.6 kg), last menstrual period 06/16/2015, SpO2 97 %. Body mass index is 34.67 kg/m.  General: Cooperative, alert, well developed, in no acute distress. HEENT: Conjunctivae and lids unremarkable. Cardiovascular: Regular rhythm.  Lungs: Normal work of breathing. Neurologic: No focal deficits.   Lab Results  Component Value Date   CREATININE 0.95 12/27/2020   BUN 12 12/27/2020   NA 138 12/27/2020   K 4.5 12/27/2020   CL 102 12/27/2020   CO2 31 12/27/2020   Lab Results  Component Value Date   ALT 16 07/13/2020   AST 20 07/13/2020   ALKPHOS 75 07/13/2020   BILITOT 0.3 07/13/2020   Lab Results  Component Value Date   HGBA1C 5.7 09/02/2021   HGBA1C 6.4 01/18/2021  HGBA1C 5.9 (H) 07/13/2020   HGBA1C 6.0 (H) 05/04/2020   HGBA1C 6.1 (H) 02/03/2020   Lab Results  Component Value Date   INSULIN 20.3 09/04/2019   INSULIN 7.6 06/25/2019   Lab Results  Component  Value Date   TSH 2.950 07/13/2020   Lab Results  Component Value Date   CHOL 250 (H) 09/02/2021   HDL 39.00 (L) 09/02/2021   LDLCALC 194 (H) 09/02/2021   TRIG 88.0 09/02/2021   CHOLHDL 6 09/02/2021   Lab Results  Component Value Date   VD25OH 52.74 09/02/2021   VD25OH 27.93 (L) 06/01/2021   VD25OH 28.57 (L) 01/18/2021   Lab Results  Component Value Date   WBC 4.4 01/18/2021   HGB 12.3 01/18/2021   HCT 37.3 01/18/2021   MCV 88.6 01/18/2021   PLT 380.0 01/18/2021   Lab Results  Component Value Date   IRON 87 07/13/2020   TIBC 330 07/13/2020   FERRITIN 104 07/13/2020   Attestation Statements:   Reviewed by clinician on day of visit: allergies, medications, problem list, medical history, surgical history, family history, social history, and previous encounter notes.  I, Water quality scientist, CMA, am acting as transcriptionist for Briscoe Deutscher, DO  I have reviewed the above documentation for accuracy and completeness, and I agree with the above. -  Briscoe Deutscher, DO, MS, FAAFP, DABOM - Family and Bariatric Medicine.

## 2021-10-27 ENCOUNTER — Encounter (INDEPENDENT_AMBULATORY_CARE_PROVIDER_SITE_OTHER): Payer: Self-pay | Admitting: Family Medicine

## 2021-10-27 DIAGNOSIS — E1169 Type 2 diabetes mellitus with other specified complication: Secondary | ICD-10-CM

## 2021-10-27 MED ORDER — TIRZEPATIDE 12.5 MG/0.5ML ~~LOC~~ SOAJ: 12.5000 mg | SUBCUTANEOUS | 0 refills | Status: DC

## 2021-11-02 MED ORDER — TIRZEPATIDE 15 MG/0.5ML ~~LOC~~ SOAJ: 15.0000 mg | SUBCUTANEOUS | 0 refills | Status: AC

## 2021-11-02 NOTE — Addendum Note (Signed)
Addended by: Karren Cobble on: 11/02/2021 01:54 PM ? ? Modules accepted: Orders ? ?

## 2021-11-15 ENCOUNTER — Encounter (INDEPENDENT_AMBULATORY_CARE_PROVIDER_SITE_OTHER): Payer: Self-pay

## 2021-11-15 ENCOUNTER — Ambulatory Visit (INDEPENDENT_AMBULATORY_CARE_PROVIDER_SITE_OTHER): Payer: 59 | Admitting: Family Medicine

## 2021-11-15 NOTE — Telephone Encounter (Signed)
Appt has been rescheduled

## 2021-11-18 ENCOUNTER — Telehealth (INDEPENDENT_AMBULATORY_CARE_PROVIDER_SITE_OTHER): Payer: 59 | Admitting: Family Medicine

## 2021-11-18 ENCOUNTER — Encounter (INDEPENDENT_AMBULATORY_CARE_PROVIDER_SITE_OTHER): Payer: Self-pay | Admitting: Family Medicine

## 2021-11-18 DIAGNOSIS — Z7985 Long-term (current) use of injectable non-insulin antidiabetic drugs: Secondary | ICD-10-CM | POA: Diagnosis not present

## 2021-11-18 DIAGNOSIS — E1169 Type 2 diabetes mellitus with other specified complication: Secondary | ICD-10-CM | POA: Diagnosis not present

## 2021-11-18 DIAGNOSIS — E669 Obesity, unspecified: Secondary | ICD-10-CM | POA: Diagnosis not present

## 2021-11-18 DIAGNOSIS — Z6834 Body mass index (BMI) 34.0-34.9, adult: Secondary | ICD-10-CM

## 2021-11-18 NOTE — Progress Notes (Signed)
TeleHealth Visit:  Due to the COVID-19 pandemic, this visit was completed with telemedicine (audio/video) technology to reduce patient and provider exposure as well as to preserve personal protective equipment.   [name] has verbally consented to this TeleHealth visit. The patient is located at home, the provider is located at home. The participants in this visit include the listed provider and patient. The visit was conducted today via MyChart video.  OBESITY [name] is here to discuss her progress with her obesity treatment plan along with follow-up of her obesity related diagnoses.   Today's visit was #: 23 Starting weight: 242 lbs Starting date: 06/25/2019 Today's date: 11/18/2021 Weight at home: 198.2 lbs (down 8 lbs)  Nutrition Plan: Keeping a food journal and adhering to recommended goals of 1200 calories and 85 grams of protein daily for 95% of the time.  Anti-obesity medications: Mounjaro 12.5 mg subcutaneously weekly.. Reported side effects: None. Activity: Cardio for 45 minutes 7 days per week.  Assessment/Plan:   Diagnoses and all orders for this visit:  Type 2 diabetes mellitus with other specified complication, without long-term current use of insulin ([place]) Diabetes Mellitus: At goal. Medication: Mounjaro. Issues reviewed: blood sugar goals, complications of diabetes mellitus, hypoglycemia prevention and treatment, exercise, and nutrition.  Plan: We will recheck labs in 3 months if not done with Endocrinology or PCP.  The patient will continue to focus on protein-rich, low simple carbohydrate foods. We reviewed the importance of hydration, regular exercise for stress reduction, and restorative sleep.   Lab Results  Component Value Date   HGBA1C 5.7 09/02/2021   HGBA1C 6.4 01/18/2021   HGBA1C 5.9 (H) 07/13/2020   Lab Results  Component Value Date   LDLCALC 194 (H) 09/02/2021   CREATININE 0.95 12/27/2020   Obesity, current BMI 34.8 [name] is currently in the action  stage of change. As such, her goal is to continue with weight loss efforts. She has agreed to Keeping a food journal and adhering to recommended goals of 1200 calories and 85 grams of protein daily.   Exercise goals: As is.  Behavioral modification strategies: increasing lean protein intake, increasing water intake, and no skipping meals.  [name] has agreed to follow-up with our clinic in 4 weeks. She was informed of the importance of frequent follow-up visits to maximize her success with intensive lifestyle modifications for her multiple health conditions.  Objective:   VITALS: Per patient if applicable, see vitals. GENERAL: Alert and in no acute distress. CARDIOPULMONARY: No increased WOB. Speaking in clear sentences.  PSYCH: Pleasant and cooperative. Speech normal rate and rhythm. Affect is appropriate. Insight and judgement are appropriate. Attention is focused, linear, and appropriate.  NEURO: Oriented as arrived to appointment on time with no prompting.   Lab Results  Component Value Date   CREATININE 0.95 12/27/2020   BUN 12 12/27/2020   NA 138 12/27/2020   K 4.5 12/27/2020   CL 102 12/27/2020   CO2 31 12/27/2020   Lab Results  Component Value Date   ALT 16 07/13/2020   AST 20 07/13/2020   ALKPHOS 75 07/13/2020   BILITOT 0.3 07/13/2020   Lab Results  Component Value Date   HGBA1C 5.7 09/02/2021   HGBA1C 6.4 01/18/2021   HGBA1C 5.9 (H) 07/13/2020   HGBA1C 6.0 (H) 05/04/2020   HGBA1C 6.1 (H) 02/03/2020   Lab Results  Component Value Date   INSULIN 20.3 09/04/2019   INSULIN 7.6 06/25/2019   Lab Results  Component Value Date   TSH  2.950 07/13/2020   Lab Results  Component Value Date   CHOL 250 (H) 09/02/2021   HDL 39.00 (L) 09/02/2021   LDLCALC 194 (H) 09/02/2021   TRIG 88.0 09/02/2021   CHOLHDL 6 09/02/2021   Lab Results  Component Value Date   WBC 4.4 01/18/2021   HGB 12.3 01/18/2021   HCT 37.3 01/18/2021   MCV 88.6 01/18/2021   PLT 380.0 01/18/2021    Lab Results  Component Value Date   IRON 87 07/13/2020   TIBC 330 07/13/2020   FERRITIN 104 07/13/2020    Attestation Statements:   Reviewed by clinician on day of visit: allergies, medications, problem list, medical history, surgical history, family history, social history, and previous encounter notes.

## 2021-11-29 ENCOUNTER — Other Ambulatory Visit: Payer: Self-pay | Admitting: Family Medicine

## 2021-11-29 DIAGNOSIS — E559 Vitamin D deficiency, unspecified: Secondary | ICD-10-CM

## 2021-12-31 ENCOUNTER — Ambulatory Visit (INDEPENDENT_AMBULATORY_CARE_PROVIDER_SITE_OTHER): Payer: 59 | Admitting: Family Medicine

## 2021-12-31 VITALS — BP 118/66 | HR 67 | Temp 97.0°F | Ht 64.0 in | Wt 191.4 lb

## 2021-12-31 DIAGNOSIS — R7303 Prediabetes: Secondary | ICD-10-CM

## 2021-12-31 DIAGNOSIS — Z6832 Body mass index (BMI) 32.0-32.9, adult: Secondary | ICD-10-CM | POA: Diagnosis not present

## 2021-12-31 DIAGNOSIS — E6609 Other obesity due to excess calories: Secondary | ICD-10-CM | POA: Diagnosis not present

## 2021-12-31 DIAGNOSIS — E782 Mixed hyperlipidemia: Secondary | ICD-10-CM | POA: Diagnosis not present

## 2021-12-31 LAB — HEMOGLOBIN A1C: Hgb A1c MFr Bld: 5.7 % (ref 4.6–6.5)

## 2021-12-31 LAB — LIPID PANEL
Cholesterol: 259 mg/dL — ABNORMAL HIGH (ref 0–200)
HDL: 43.2 mg/dL (ref >39.00)
LDL Cholesterol: 202 mg/dL — ABNORMAL HIGH (ref 0–99)
NonHDL: 215.95
Total CHOL/HDL Ratio: 6
Triglycerides: 70 mg/dL (ref 0.0–149.0)
VLDL: 14 mg/dL (ref 0.0–40.0)
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
## 2022-03-16 ENCOUNTER — Encounter (INDEPENDENT_AMBULATORY_CARE_PROVIDER_SITE_OTHER): Payer: Self-pay

## 2022-05-03 ENCOUNTER — Ambulatory Visit: Payer: 59 | Admitting: Family Medicine

## 2022-05-27 ENCOUNTER — Ambulatory Visit (INDEPENDENT_AMBULATORY_CARE_PROVIDER_SITE_OTHER): Payer: 59 | Admitting: Internal Medicine

## 2022-05-27 ENCOUNTER — Encounter (HOSPITAL_BASED_OUTPATIENT_CLINIC_OR_DEPARTMENT_OTHER): Payer: Self-pay | Admitting: Internal Medicine

## 2022-05-27 VITALS — BP 115/73 | HR 71 | Resp 100 | Ht 64.0 in | Wt 175.2 lb

## 2022-05-27 DIAGNOSIS — T466X5A Adverse effect of antihyperlipidemic and antiarteriosclerotic drugs, initial encounter: Secondary | ICD-10-CM | POA: Diagnosis not present

## 2022-05-27 DIAGNOSIS — M791 Myalgia, unspecified site: Secondary | ICD-10-CM

## 2022-05-27 DIAGNOSIS — E7800 Pure hypercholesterolemia, unspecified: Secondary | ICD-10-CM | POA: Diagnosis not present

## 2022-05-27 NOTE — Patient Instructions (Signed)
Medication Instructions:  NO CHANGES TODAY -- will depend on lab/test results  *If you need a refill on your cardiac medications before your next appointment, please call your pharmacy*   Lab Work: NMR Lipoprofile and LPa today   FASTING lab work to check cholesterol before your next viist  If you have labs (blood work) drawn today and your tests are completely normal, you will receive your results only by: [facility] (if you have MyChart) OR A paper copy in the mail If you have any lab test that is abnormal or we need to change your treatment, we will call you to review the results.   Testing/Procedures: Genetic Test -- results available in about 2-3 weeks   Follow-Up: At [facility], you and your health needs are our priority.  As part of our continuing mission to provide you with exceptional heart care, we have created designated Provider Care Teams.  These Care Teams include your primary Cardiologist (physician) and Advanced Practice Providers (APPs -  Physician Assistants and Nurse Practitioners) who all work together to provide you with the care you need, when you need it.  We recommend signing up for the patient portal called "MyChart".  Sign up information is provided on this After Visit Summary.  MyChart is used to connect with patients for Virtual Visits (Telemedicine).  Patients are able to view lab/test results, encounter notes, upcoming appointments, etc.  Non-urgent messages can be sent to your provider as well.   To learn more about what you can do with MyChart, go to [email or web address].    Your next appointment:    4 months with Dr. Debara Pickett
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
## 2022-05-29 LAB — NMR, LIPOPROFILE
Cholesterol, Total: 306 mg/dL — ABNORMAL HIGH (ref 100–199)
HDL Particle Number: 23.6 umol/L — ABNORMAL LOW (ref >=30.5)
HDL-C: 52 mg/dL (ref >39)
LDL Particle Number: 2496 nmol/L — ABNORMAL HIGH (ref <1000)
LDL Size: 21 nm (ref >20.5)
LDL-C (NIH Calc): 243 mg/dL — ABNORMAL HIGH (ref 0–99)
LP-IR Score: 81 — ABNORMAL HIGH (ref <=45)
Small LDL Particle Number: 421 nmol/L (ref <=527)
Triglycerides: 73 mg/dL (ref 0–149)

## 2022-05-29 LAB — LIPOPROTEIN A (LPA): Lipoprotein (a): 54.1 nmol/L (ref <75.0)

## 2022-06-02 ENCOUNTER — Telehealth: Payer: Self-pay | Admitting: Internal Medicine

## 2022-06-02 ENCOUNTER — Other Ambulatory Visit: Payer: Self-pay | Admitting: *Deleted

## 2022-06-02 DIAGNOSIS — E7800 Pure hypercholesterolemia, unspecified: Secondary | ICD-10-CM

## 2022-06-02 DIAGNOSIS — E782 Mixed hyperlipidemia: Secondary | ICD-10-CM

## 2022-06-02 DIAGNOSIS — M791 Myalgia, unspecified site: Secondary | ICD-10-CM

## 2022-06-02 NOTE — Telephone Encounter (Signed)
Based on lab results:   Hilty, Nadean Corwin, MD  Fidel Levy, RN Would pursue PCSK9 antibody therapy - she has FH.   Thanks.

## 2022-06-02 NOTE — Telephone Encounter (Signed)
PA submitted.  Key:  [id number]

## 2022-06-06 ENCOUNTER — Ambulatory Visit (INDEPENDENT_AMBULATORY_CARE_PROVIDER_SITE_OTHER): Payer: 59 | Admitting: Family Medicine

## 2022-06-06 ENCOUNTER — Encounter: Payer: Self-pay | Admitting: Family Medicine

## 2022-06-06 VITALS — BP 116/68 | HR 80 | Temp 96.7°F | Ht 64.0 in | Wt 172.8 lb

## 2022-06-06 DIAGNOSIS — E663 Overweight: Secondary | ICD-10-CM | POA: Diagnosis not present

## 2022-06-06 DIAGNOSIS — E782 Mixed hyperlipidemia: Secondary | ICD-10-CM

## 2022-06-06 DIAGNOSIS — Z6379 Other stressful life events affecting family and household: Secondary | ICD-10-CM

## 2022-06-06 DIAGNOSIS — Z1339 Encounter for screening examination for other mental health and behavioral disorders: Secondary | ICD-10-CM
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
## 2022-06-07 MED ORDER — REPATHA SURECLICK 140 MG/ML ~~LOC~~ SOAJ: 1.0000 mL | SUBCUTANEOUS | 3 refills | Status: DC

## 2022-06-07 NOTE — Telephone Encounter (Signed)
MyChart message sent to patient w/update and website for co-pay card

## 2022-06-07 NOTE — Telephone Encounter (Signed)
PA approved through 12/02/22

## 2022-06-07 NOTE — Telephone Encounter (Signed)
Rx sent to pharmacy and order placed for repeat lipid panel

## 2022-06-07 NOTE — Addendum Note (Signed)
Addended by: Rollen Sox on: 06/07/2022 09:26 AM   Modules accepted: Orders

## 2022-06-15 ENCOUNTER — Encounter (HOSPITAL_BASED_OUTPATIENT_CLINIC_OR_DEPARTMENT_OTHER): Payer: Self-pay | Admitting: Internal Medicine

## 2022-07-26 ENCOUNTER — Ambulatory Visit (HOSPITAL_BASED_OUTPATIENT_CLINIC_OR_DEPARTMENT_OTHER): Payer: 59 | Admitting: Internal Medicine

## 2022-08-24 ENCOUNTER — Other Ambulatory Visit: Payer: Self-pay | Admitting: *Deleted

## 2022-08-24 DIAGNOSIS — E7800 Pure hypercholesterolemia, unspecified: Secondary | ICD-10-CM

## 2022-08-25 ENCOUNTER — Other Ambulatory Visit: Payer: Self-pay | Admitting: Family Medicine

## 2022-08-25 DIAGNOSIS — Z1231 Encounter for screening mammogram for malignant neoplasm of breast: Secondary | ICD-10-CM

## 2022-10-04 ENCOUNTER — Ambulatory Visit (HOSPITAL_BASED_OUTPATIENT_CLINIC_OR_DEPARTMENT_OTHER): Payer: 59 | Admitting: Internal Medicine

## 2022-10-08 ENCOUNTER — Encounter: Payer: Self-pay | Admitting: Family Medicine

## 2022-10-13 ENCOUNTER — Ambulatory Visit
Admission: RE | Admit: 2022-10-13 | Discharge: 2022-10-13 | Disposition: A | Discharge status: Home / Self Care | Payer: 59 | Source: Ambulatory Visit | Attending: Family Medicine | Admitting: Family Medicine

## 2022-10-13 DIAGNOSIS — Z1231 Encounter for screening mammogram for malignant neoplasm of breast: Secondary | ICD-10-CM

## 2022-10-18 ENCOUNTER — Encounter: Payer: Self-pay | Admitting: Family Medicine

## 2022-10-18 ENCOUNTER — Ambulatory Visit (INDEPENDENT_AMBULATORY_CARE_PROVIDER_SITE_OTHER): Payer: 59 | Admitting: Family Medicine

## 2022-10-18 VITALS — BP 94/52 | HR 92 | Temp 97.8°F | Ht 64.0 in | Wt 163.2 lb

## 2022-10-18 DIAGNOSIS — R1084 Generalized abdominal pain: Secondary | ICD-10-CM | POA: Diagnosis not present

## 2022-10-18 DIAGNOSIS — R0789 Other chest pain: Secondary | ICD-10-CM

## 2022-10-18 DIAGNOSIS — E663 Overweight: Secondary | ICD-10-CM | POA: Diagnosis not present

## 2022-10-18 LAB — COMPREHENSIVE METABOLIC PANEL
ALT: 14 U/L (ref 0–35)
AST: 16 U/L (ref 0–37)
Albumin: 3.6 g/dL (ref 3.5–5.2)
Alkaline Phosphatase: 56 U/L (ref 39–117)
BUN: 13 mg/dL (ref 6–23)
CO2: 31 mEq/L (ref 19–32)
Calcium: 9.4 mg/dL (ref 8.4–10.5)
Chloride: 101 mEq/L (ref 96–112)
Creatinine, Ser: 0.83 mg/dL (ref 0.40–1.20)
GFR: 81.03 mL/min (ref >60.00)
Glucose, Bld: 74 mg/dL (ref 70–99)
Potassium: 4.2 mEq/L (ref 3.5–5.1)
Sodium: 139 mEq/L (ref 135–145)
Total Bilirubin: 0.4 mg/dL (ref 0.2–1.2)
Total Protein: 6.8 g/dL (ref 6.0–8.3)

## 2022-10-18 LAB — TSH: TSH: 2.4 u[IU]/mL (ref 0.35–5.50)

## 2022-10-18 LAB — T4, FREE: Free T4: 0.94 ng/dL (ref 0.60–1.60)

## 2022-10-18 LAB — LIPASE: Lipase: 62 U/L — ABNORMAL HIGH (ref 11.0–59.0)

## 2022-10-18 NOTE — Assessment & Plan Note (Signed)
Maximum weight: 254 lbs (02/2021) Current weight: 163 lbs Weight change since last visit: - 8 lbs Total weight loss: 91 lbs (35.8%)  Currently using Mounjaro 15 mg every other week. Maintaining regular exercise and diet plan.
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
## 2022-11-22 ENCOUNTER — Telehealth: Payer: Self-pay | Admitting: Internal Medicine

## 2022-11-22 LAB — NMR, LIPOPROFILE
Cholesterol, Total: 228 mg/dL — ABNORMAL HIGH (ref 100–199)
HDL Particle Number: 27.3 umol/L — ABNORMAL LOW (ref >=30.5)
HDL-C: 55 mg/dL (ref >39)
LDL Particle Number: 1694 nmol/L — ABNORMAL HIGH (ref <1000)
LDL Size: 20.9 nm (ref >20.5)
LDL-C (NIH Calc): 161 mg/dL — ABNORMAL HIGH (ref 0–99)
LP-IR Score: 27 (ref <=45)
Small LDL Particle Number: 555 nmol/L — ABNORMAL HIGH (ref <=527)
Triglycerides: 70 mg/dL (ref 0–149)

## 2022-11-22 NOTE — Telephone Encounter (Signed)
PA for Repatha submitted via CMM (Key: [id number])

## 2022-11-23 NOTE — Telephone Encounter (Signed)
Repatha PA approved through 11/22/23

## 2022-11-28 ENCOUNTER — Ambulatory Visit: Payer: 59 | Attending: Internal Medicine | Admitting: Internal Medicine

## 2022-11-28 ENCOUNTER — Encounter: Payer: Self-pay | Admitting: Internal Medicine

## 2022-11-28 VITALS — BP 112/64 | HR 80 | Ht 64.0 in | Wt 164.6 lb

## 2022-11-28 DIAGNOSIS — T466X5A Adverse effect of antihyperlipidemic and antiarteriosclerotic drugs, initial encounter: Secondary | ICD-10-CM

## 2022-11-28 DIAGNOSIS — R0789 Other chest pain: Secondary | ICD-10-CM

## 2022-11-28 DIAGNOSIS — E7849 Other hyperlipidemia: Secondary | ICD-10-CM | POA: Diagnosis not present

## 2022-11-28 DIAGNOSIS — M791 Myalgia, unspecified site: Secondary | ICD-10-CM

## 2022-11-28 NOTE — Patient Instructions (Signed)
Medication Instructions:  Consider Nexlizet for lipid management   *If you need a refill on your cardiac medications before your next appointment, please call your pharmacy*   Lab Work: FASTING lab work before next visit  If you have labs (blood work) drawn today and your tests are completely normal, you will receive your results only by: MyChart Message (if you have MyChart) OR A paper copy in the mail If you have any lab test that is abnormal or we need to change your treatment, we will call you to review the results.   Follow-Up: At [facility], you and your health needs are our priority.  As part of our continuing mission to provide you with exceptional heart care, we have created designated Provider Care Teams.  These Care Teams include your primary Cardiologist (physician) and Advanced Practice Providers (APPs -  Physician Assistants and Nurse Practitioners) who all work together to provide you with the care you need, when you need it.  We recommend signing up for the patient portal called "MyChart".  Sign up information is provided on this After Visit Summary.  MyChart is used to connect with patients for Virtual Visits (Telemedicine).  Patients are able to view lab/test results, encounter notes, upcoming appointments, etc.  Non-urgent messages can be sent to your provider as well.   To learn more about what you can do with MyChart, go to [email or web address].    Your next appointment:    4 months with Dr. Rennis Golden
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
## 2022-12-06 ENCOUNTER — Ambulatory Visit: Payer: 59 | Admitting: Family Medicine

## 2022-12-09 ENCOUNTER — Ambulatory Visit: Payer: 59 | Admitting: Family Medicine
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
## 2022-12-22 ENCOUNTER — Encounter (HOSPITAL_BASED_OUTPATIENT_CLINIC_OR_DEPARTMENT_OTHER): Payer: Self-pay | Admitting: Cardiovascular Disease

## 2022-12-22 ENCOUNTER — Ambulatory Visit (INDEPENDENT_AMBULATORY_CARE_PROVIDER_SITE_OTHER): Payer: 59 | Admitting: Cardiovascular Disease

## 2022-12-22 VITALS — BP 118/80 | HR 70 | Ht 64.0 in | Wt 164.4 lb

## 2022-12-22 DIAGNOSIS — R0789 Other chest pain: Secondary | ICD-10-CM | POA: Diagnosis not present

## 2022-12-22 DIAGNOSIS — E782 Mixed hyperlipidemia: Secondary | ICD-10-CM | POA: Diagnosis not present

## 2022-12-22 DIAGNOSIS — E7849 Other hyperlipidemia: Secondary | ICD-10-CM

## 2022-12-22 HISTORY — DX: Other chest pain: R07.89

## 2022-12-22 NOTE — Assessment & Plan Note (Signed)
Her symptoms are very atypical.  It sounds like neuropathic pain.  She exercises regularly and can run a 5K without any ischemic symptoms or atypical chest pain.  I do not think any ischemia evaluation is indicated at this time.  It is reasonable to repeat her coronary calcium score as her last one was 7 years ago.  If we see significant progression of disease, would recommend intensifying her lipid management as recommended by Dr. Rennis Golden.  We discussed why stress testing would not be particularly helpful at this time and she expressed understanding.

## 2022-12-22 NOTE — Patient Instructions (Signed)
Medication Instructions:  Your physician recommends that you continue on your current medications as directed. Please refer to the Current Medication list given to you today.    *If you need a refill on your cardiac medications before your next appointment, please call your pharmacy*  Lab Work: NONE  Testing/Procedures: CALCIUM SCORE - THIS WILL COST YOU $99 OUT OF POCKET   Follow-Up: At [facility], you and your health needs are our priority.  As part of our continuing mission to provide you with exceptional heart care, we have created designated Provider Care Teams.  These Care Teams include your primary Cardiologist (physician) and Advanced Practice Providers (APPs -  Physician Assistants and Nurse Practitioners) who all work together to provide you with the care you need, when you need it.  We recommend signing up for the patient portal called "MyChart".  Sign up information is provided on this After Visit Summary.  MyChart is used to connect with patients for Virtual Visits (Telemedicine).  Patients are able to view lab/test results, encounter notes, upcoming appointments, etc.  Non-urgent messages can be sent to your provider as well.   To learn more about what you can do with MyChart, go to [email or web address].    Your next appointment:   6 month(s)  Provider:   Chilton Si, MD

## 2022-12-22 NOTE — Assessment & Plan Note (Addendum)
Lipids are managed by Dr. Rennis Golden.  She is doing well on Repatha and had a significant reduction in her LDL, though numbers still are not at goal.  Dr. Rennis Golden recommended intensifying therapy by adding next visit.  She is not interested in adding any medication at this time and would like to focus on diet and exercise.  We discussed the benefits of a plant based diet and she is going to work on this.  She also uses a red yeast rice extract in her smoothies.  She would like to see what her lipids are doing in the next few months after making these interventions and we will consider intensifying medical therapy at that time.

## 2023-01-27 ENCOUNTER — Ambulatory Visit (HOSPITAL_BASED_OUTPATIENT_CLINIC_OR_DEPARTMENT_OTHER)
Admission: RE | Admit: 2023-01-27 | Discharge: 2023-01-27 | Disposition: A | Discharge status: Home / Self Care | Payer: 59 | Source: Ambulatory Visit | Attending: Cardiovascular Disease | Admitting: Cardiovascular Disease

## 2023-01-27 DIAGNOSIS — E7849 Other hyperlipidemia: Secondary | ICD-10-CM | POA: Insufficient documentation

## 2023-01-27 DIAGNOSIS — R0789 Other chest pain: Secondary | ICD-10-CM

## 2023-03-21 ENCOUNTER — Other Ambulatory Visit: Payer: Self-pay | Admitting: *Deleted

## 2023-03-21 DIAGNOSIS — E7849 Other hyperlipidemia: Secondary | ICD-10-CM

## 2023-03-23 ENCOUNTER — Encounter (INDEPENDENT_AMBULATORY_CARE_PROVIDER_SITE_OTHER): Payer: Self-pay

## 2023-04-20 ENCOUNTER — Ambulatory Visit: Payer: 59 | Admitting: Family Medicine

## 2023-06-16 LAB — NMR, LIPOPROFILE
Cholesterol, Total: 237 mg/dL — ABNORMAL HIGH (ref 100–199)
HDL Particle Number: 29.2 umol/L — ABNORMAL LOW (ref >=30.5)
HDL-C: 62 mg/dL (ref >39)
LDL Particle Number: 2018 nmol/L — ABNORMAL HIGH (ref <1000)
LDL Size: 20.9 nmol (ref >20.5)
LDL-C (NIH Calc): 167 mg/dL — ABNORMAL HIGH (ref 0–99)
LP-IR Score: <25 (ref <=45)
Small LDL Particle Number: 839 nmol/L — ABNORMAL HIGH (ref <=527)
Triglycerides: 51 mg/dL (ref 0–149)

## 2023-06-19 ENCOUNTER — Ambulatory Visit (INDEPENDENT_AMBULATORY_CARE_PROVIDER_SITE_OTHER): Payer: 59 | Admitting: Cardiovascular Disease

## 2023-06-19 ENCOUNTER — Other Ambulatory Visit (HOSPITAL_BASED_OUTPATIENT_CLINIC_OR_DEPARTMENT_OTHER): Payer: Self-pay | Admitting: *Deleted

## 2023-06-19 ENCOUNTER — Encounter (HOSPITAL_BASED_OUTPATIENT_CLINIC_OR_DEPARTMENT_OTHER): Payer: Self-pay | Admitting: Cardiovascular Disease

## 2023-06-19 VITALS — BP 104/62 | HR 78 | Ht 64.0 in | Wt 152.4 lb

## 2023-06-19 DIAGNOSIS — E782 Mixed hyperlipidemia: Secondary | ICD-10-CM

## 2023-06-19 DIAGNOSIS — Z636 Dependent relative needing care at home: Secondary | ICD-10-CM | POA: Insufficient documentation

## 2023-06-19 HISTORY — DX: Dependent relative needing care at home: Z63.6

## 2023-06-19 NOTE — Progress Notes (Signed)
Cardiology Office Note:  .   Date:  06/19/2023  ID:  [name], DOB 22-Mar-1971, MRN [id number] PCP: Loyola Mast, MD  [facility] Providers Cardiologist:  Chrystie Nose, MD    History of Present Illness: .   [name] is a 52 y.o. female with a hx of hyperlipidemia, GERD, iron deficiency anemia, ADD, and morbid obesity, here for follow up.  She was seen 12/2022 for the evaluation of chest pain. She saw her PCP Dr. Veto Kemps on 10/18/2022 and complained of sharp chest pains in her mid-chest. Sometimes associated with mid-scapular pain and numbness of her fingertips. She had been on a break from exercise, but her symptoms didn't improve with or without exercising. She was concerned about cardiac disease given her history of obesity and elevated lipids. Her regimen included Repatha and Mounjaro at that visit. Her symptoms seemed neuropathic. She had a repeat calcium score which was 0.   She has also seen Dr. Rennis Golden in the lipid clinic, last visit 11/28/2022. She has a family history of hyperlipidemia in both of her parents. She has a confirmed LDL receptor mutation, and known statin intolerance.  Her lipids remained elevated and he recommended intensifying her therapy but she wanted to keep working on diet and exercise.  She wasn't always taking it as frequently as prescribed.   Ms. [name] presents with intermittent episodes of chest heaviness. The patient describes the sensation as a "tightness" on the left side, which is not constant but varies from day to day. The patient has noticed a correlation between this sensation and periods of high stress, particularly related to the care of her father who is suffering from Alzheimer's and dementia.  She and her sister are caring for him and she hasn't had much time for herself.  He is in a phase of acting out and she struggles with watching him decline.   The patient has also noted a lack of focus on her own health, with a decrease in  exercise and a less healthy diet. She used to be an active runner, participating in events during the holidays, but has not been running recently. Despite this, she reports no issues with chest pain during or after exercise.  Ms. [name] cholesterol levels have increased, despite being on a regimen of Repatha every two weeks. She has been compliant with this medication, setting reminders to ensure regular administration.  She has also expressed interest in joining a gym to increase physical activity, particularly as the weather gets colder. The patient has no other cardiovascular disease, and recent calcium scoring has shown no signs of plaque in the heart arteries.      ROS:  As per HPI  Studies Reviewed: Marland Kitchen       Calcium score 0 01/2023 and 11/2017  Risk Assessment/Calculations:             Physical Exam:    VS:  BP 104/62 (BP Location: Right Arm, Patient Position: Sitting, Cuff Size: Normal)   Pulse 78   Ht 5\' 4"  (1.626 m)   Wt 152 lb 6.4 oz (69.1 kg)   LMP 06/16/2015   SpO2 95%   BMI 26.16 kg/m  , BMI Body mass index is 26.16 kg/m. GENERAL:  Well appearing HEENT: Pupils equal round and reactive, fundi not visualized, oral mucosa unremarkable NECK:  No jugular venous distention, waveform within normal limits, carotid upstroke brisk and symmetric, no bruits, no thyromegaly LUNGS:  Clear to auscultation bilaterally HEART:  RRR.  PMI not displaced or sustained,S1 and S2 within normal limits, no S3, no S4, no clicks, no rubs, no murmurs ABD:  Flat, positive bowel sounds normal in frequency in pitch, no bruits, no rebound, no guarding, no midline pulsatile mass, no hepatomegaly, no splenomegaly EXT:  2 plus pulses throughout, no edema, no cyanosis no clubbing SKIN:  No rashes no nodules NEURO:  Cranial nerves II through XII grossly intact, motor grossly intact throughout PSYCH:  Cognitively intact, oriented to person place and time   ASSESSMENT AND PLAN: .    # Chest  Discomfort Intermittent chest discomfort, likely related to stress. No symptoms during exercise. Calcium score remains zero, indicating low likelihood of coronary artery disease.  She also has no chest discomfort when running.  -Continue current management. -Consider stress management techniques and regular exercise.  #  Familial Hyperlipidemia Despite Repatha every two weeks, cholesterol levels have increased. Patient has a history of intolerance to statins. -Transition care back to Dr. Rennis Golden, a lipid specialist, for further management. -Continue Repatha every two weeks. -Encourage healthy diet and regular exercise. -Plan for follow-up with Dr. Rennis Golden in six months.  # Stress Management Patient is under significant stress due to caregiving responsibilities for father with Alzheimer's and dementia. -Consider joining a gym for regular exercise as a stress management strategy. -Consider seeking additional support for caregiving responsibilities.  Follow-up Plan for follow-up with Dr. Rennis Golden in six months. Consider having an updated lipid panel before the visit.       Signed, Chilton Si, MD

## 2023-06-19 NOTE — Patient Instructions (Addendum)
Medication Instructions:  Your physician recommends that you continue on your current medications as directed. Please refer to the Current Medication list given to you today.  *If you need a refill on your cardiac medications before your next appointment, please call your pharmacy*   Lab Work: 1 WEEK BEFORE NEXT APPT IN 6 MONTHS:  NMPR LIPID PANEL (FASTING)  If you have labs (blood work) drawn today and your tests are completely normal, you will receive your results only by: MyChart Message (if you have MyChart) OR A paper copy in the mail If you have any lab test that is abnormal or we need to change your treatment, we will call you to review the results.   Testing/Procedures: None ordered   Follow-Up: At [facility], you and your health needs are our priority.  As part of our continuing mission to provide you with exceptional heart care, we have created designated Provider Care Teams.  These Care Teams include your primary Cardiologist (physician) and Advanced Practice Providers (APPs -  Physician Assistants and Nurse Practitioners) who all work together to provide you with the care you need, when you need it.  We recommend signing up for the patient portal called "MyChart".  Sign up information is provided on this After Visit Summary.  MyChart is used to connect with patients for Virtual Visits (Telemedicine).  Patients are able to view lab/test results, encounter notes, upcoming appointments, etc.  Non-urgent messages can be sent to your provider as well.   To learn more about what you can do with MyChart, go to [email or web address].    Your next appointment:   6 month(s)  Provider:   Dr. Rennis Golden    Other Instructions

## 2023-07-11 ENCOUNTER — Encounter: Payer: Self-pay | Admitting: Internal Medicine

## 2023-07-11 DIAGNOSIS — E782 Mixed hyperlipidemia: Secondary | ICD-10-CM

## 2023-07-11 DIAGNOSIS — T466X5A Adverse effect of antihyperlipidemic and antiarteriosclerotic drugs, initial encounter: Secondary | ICD-10-CM

## 2023-07-11 MED ORDER — REPATHA SURECLICK 140 MG/ML ~~LOC~~ SOAJ: 1.0000 mL | SUBCUTANEOUS | 3 refills | Status: AC

## 2023-07-20 ENCOUNTER — Encounter: Payer: 59 | Admitting: Family Medicine

## 2023-07-26 ENCOUNTER — Encounter: Payer: 59 | Admitting: Family Medicine

## 2023-10-20 ENCOUNTER — Other Ambulatory Visit (HOSPITAL_COMMUNITY): Payer: Self-pay

## 2023-10-20 MED ORDER — AMPHETAMINE-DEXTROAMPHET ER 20 MG PO CP24
20.0000 mg | ORAL_CAPSULE | Freq: Every morning | ORAL | 0 refills | Status: DC
  Filled 2023-10-20 – 2023-12-05 (×2): qty 30, 30d supply, fill #0

## 2023-12-05 ENCOUNTER — Other Ambulatory Visit (HOSPITAL_COMMUNITY): Payer: Self-pay

## 2023-12-26 ENCOUNTER — Encounter: Payer: Self-pay | Admitting: Family Medicine

## 2023-12-26 ENCOUNTER — Ambulatory Visit: Payer: Self-pay | Admitting: Family Medicine

## 2023-12-26 ENCOUNTER — Ambulatory Visit (INDEPENDENT_AMBULATORY_CARE_PROVIDER_SITE_OTHER): Admitting: Family Medicine

## 2023-12-26 VITALS — BP 110/68 | HR 81 | Temp 97.2°F | Ht 64.0 in | Wt 148.4 lb

## 2023-12-26 DIAGNOSIS — Z0000 Encounter for general adult medical examination without abnormal findings: Secondary | ICD-10-CM | POA: Diagnosis not present

## 2023-12-26 DIAGNOSIS — E782 Mixed hyperlipidemia: Secondary | ICD-10-CM

## 2023-12-26 DIAGNOSIS — Z1231 Encounter for screening mammogram for malignant neoplasm of breast: Secondary | ICD-10-CM

## 2023-12-26 DIAGNOSIS — D72819 Decreased white blood cell count, unspecified: Secondary | ICD-10-CM

## 2023-12-26 DIAGNOSIS — G43109 Migraine with aura, not intractable, without status migrainosus: Secondary | ICD-10-CM | POA: Diagnosis not present

## 2023-12-26 DIAGNOSIS — Z23 Encounter for immunization: Secondary | ICD-10-CM

## 2023-12-26 DIAGNOSIS — E663 Overweight: Secondary | ICD-10-CM | POA: Diagnosis not present

## 2023-12-26 DIAGNOSIS — R7303 Prediabetes: Secondary | ICD-10-CM

## 2023-12-26 LAB — BASIC METABOLIC PANEL WITH GFR
BUN: 14 mg/dL (ref 6–23)
CO2: 29 meq/L (ref 19–32)
Calcium: 8.6 mg/dL (ref 8.4–10.5)
Chloride: 101 meq/L (ref 96–112)
Creatinine, Ser: 0.82 mg/dL (ref 0.40–1.20)
GFR: 81.54 mL/min (ref >60.00)
Glucose, Bld: 78 mg/dL (ref 70–99)
Potassium: 3.7 meq/L (ref 3.5–5.1)
Sodium: 137 meq/L (ref 135–145)

## 2023-12-26 LAB — LIPID PANEL
Cholesterol: 218 mg/dL — ABNORMAL HIGH (ref 0–200)
HDL: 49 mg/dL (ref >39.00)
LDL Cholesterol: 160 mg/dL — ABNORMAL HIGH (ref 0–99)
NonHDL: 169.44
Total CHOL/HDL Ratio: 4
Triglycerides: 48 mg/dL (ref 0.0–149.0)
VLDL: 9.6 mg/dL (ref 0.0–40.0)

## 2023-12-26 LAB — CBC
HCT: 36.5 % (ref 36.0–46.0)
Hemoglobin: 12.1 g/dL (ref 12.0–15.0)
MCHC: 33.1 g/dL (ref 30.0–36.0)
MCV: 89.9 fl (ref 78.0–100.0)
Platelets: 287 10*3/uL (ref 150.0–400.0)
RBC: 4.05 Mil/uL (ref 3.87–5.11)
RDW: 13.7 % (ref 11.5–15.5)
WBC: 2.9 10*3/uL — ABNORMAL LOW (ref 4.0–10.5)

## 2023-12-26 LAB — HEMOGLOBIN A1C: Hgb A1c MFr Bld: 5.6 % (ref 4.6–6.5)

## 2023-12-26 MED ORDER — ZOLMITRIPTAN 2.5 MG PO TBDP: 2.5000 mg | ORAL_TABLET | ORAL | 3 refills | Status: AC | PRN

## 2023-12-26 NOTE — Assessment & Plan Note (Signed)
 I will recheck her A1c and blood sugar.

## 2023-12-26 NOTE — Addendum Note (Signed)
 Addended by: Graig Lawyer on: 12/26/2023 12:56 PM   Modules accepted: Level of Service

## 2023-12-26 NOTE — Assessment & Plan Note (Signed)
 Overall health is excellent. Recommend continued regular exercise. Discussed recommended screenings and immunizations.

## 2023-12-26 NOTE — Assessment & Plan Note (Signed)
 Maximum weight: 254 lbs (02/2021) Current weight: 148 lbs Weight change since last visit: - 4 lbs Total weight loss: -106 lbs (41.7 %)  [name] has achieved significant weight loss. She continues to be actively engaged in exercise. She is on maintenance dosing with tirzepatide  (Mounjaro ).
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
## 2023-12-26 NOTE — Assessment & Plan Note (Signed)
 I will refill Zomig  ODT.

## 2023-12-26 NOTE — Assessment & Plan Note (Signed)
 I will reassess her lipids today. Continue Repatha .

## 2023-12-29 ENCOUNTER — Other Ambulatory Visit (INDEPENDENT_AMBULATORY_CARE_PROVIDER_SITE_OTHER)

## 2023-12-29 DIAGNOSIS — D72819 Decreased white blood cell count, unspecified: Secondary | ICD-10-CM | POA: Diagnosis not present

## 2023-12-29 LAB — CBC WITH DIFFERENTIAL/PLATELET
Basophils Absolute: 0 10*3/uL (ref 0.0–0.1)
Basophils Relative: 0.8 % (ref 0.0–3.0)
Eosinophils Absolute: 0.1 10*3/uL (ref 0.0–0.7)
Eosinophils Relative: 2.7 % (ref 0.0–5.0)
HCT: 35.7 % — ABNORMAL LOW (ref 36.0–46.0)
Hemoglobin: 12 g/dL (ref 12.0–15.0)
Lymphocytes Relative: 35.5 % (ref 12.0–46.0)
Lymphs Abs: 1.1 10*3/uL (ref 0.7–4.0)
MCHC: 33.6 g/dL (ref 30.0–36.0)
MCV: 89.8 fl (ref 78.0–100.0)
Monocytes Absolute: 0.3 10*3/uL (ref 0.1–1.0)
Monocytes Relative: 11.1 % (ref 3.0–12.0)
Neutro Abs: 1.5 10*3/uL (ref 1.4–7.7)
Neutrophils Relative %: 49.9 % (ref 43.0–77.0)
Platelets: 294 10*3/uL (ref 150.0–400.0)
RBC: 3.98 Mil/uL (ref 3.87–5.11)
RDW: 13.7 % (ref 11.5–15.5)
WBC: 3.1 10*3/uL — ABNORMAL LOW (ref 4.0–10.5)

## 2023-12-29 LAB — HEPATIC FUNCTION PANEL
ALT: 13 U/L (ref 0–35)
AST: 16 U/L (ref 0–37)
Albumin: 3.9 g/dL (ref 3.5–5.2)
Alkaline Phosphatase: 51 U/L (ref 39–117)
Bilirubin, Direct: 0 mg/dL (ref 0.0–0.3)
Total Bilirubin: 0.3 mg/dL (ref 0.2–1.2)
Total Protein: 7.2 g/dL (ref 6.0–8.3)

## 2023-12-29 LAB — FOLATE: Folate: >23.2 ng/mL (ref >5.9)

## 2023-12-29 LAB — VITAMIN B12: Vitamin B-12: 850 pg/mL (ref 211–911)

## 2024-01-02 ENCOUNTER — Ambulatory Visit: Payer: Self-pay | Admitting: Family Medicine

## 2024-01-02 LAB — HIV ANTIBODY (ROUTINE TESTING W REFLEX): HIV 1&2 Ab, 4th Generation: NONREACTIVE

## 2024-01-02 LAB — CERULOPLASMIN: Ceruloplasmin: 28 mg/dL (ref 14–48)

## 2024-01-18 ENCOUNTER — Telehealth: Payer: Self-pay | Admitting: Pharmacy Technician

## 2024-01-18 NOTE — Telephone Encounter (Signed)
 Pharmacy Patient Advocate Encounter  Received notification from [employer] that Prior Authorization for Repatha  SureClick 140MG /ML auto-injectors has been APPROVED from 01/18/24 to 08/07/2038. Spoke to pharmacy to process.Copay is $0.00.    PA #/Case ID/Reference #: [id number]

## 2024-01-18 NOTE — Telephone Encounter (Signed)
 Pharmacy Patient Advocate Encounter   Received notification from CoverMyMeds that prior authorization for Repatha  SureClick 140MG /ML auto-injectors is required/requested.   Insurance verification completed.   The patient is insured through [facility] .   Per test claim: PA required; PA submitted to above mentioned insurance via CoverMyMeds Key/confirmation #/EOC [id number] Status is pending

## 2024-02-07 ENCOUNTER — Encounter (HOSPITAL_BASED_OUTPATIENT_CLINIC_OR_DEPARTMENT_OTHER): Admitting: Nurse Practitioner

## 2024-02-07 NOTE — Progress Notes (Deleted)
 Cardiology Office Note   Date:  02/07/2024  ID:  [name], DOB Jan 04, 1971, MRN [id number] PCP: Thedora Garnette HERO, MD  [facility] Providers Cardiologist:  Vinie JAYSON Maxcy, MD { Click to update primary MD,subspecialty MD or APP then REFRESH:1}    PMH Dyslipidemia GERD Statin intolerance Iron deficiency anemia Attention deficit disorder Coronary calcium  score of 0 on 01/31/2023  (previous score of 0 11/15/2017)  Referred to advanced lipid disorder clinic and seen by Dr. Maxcy 05/27/2022.  History of high cholesterol with intolerance to statins.  She had recently been able to tolerate low-dose red yeast rice.  She had a calcium  score of 0 in 2019, but her cholesterol has been over 300 in the past.  Most recent lipid profile showed total cholesterol 259, HDL 43, triglycerides 70 and LDL 202.  Suggestive of a genetic cholesterol disorder.  She reported her mother had high cholesterol and her father had heart disease.  Recent significant weight loss of more than 80 lbs. She had been an avid runner and had completed some 5K's.  Genetic testing revealed familial hyperlipidemia.  There were also variants likely associated with high cholesterol and increased cardiovascular disease including variation in APO B gene as well as APO E3/E4 gene, also associated with increased risk of Alzheimer's dementia, variant in LP(a) gene predicting high LP(a) levels, although her level was not elevated, and mutation associated with genetic risk for obesity.  Recommendation to start Repatha  140 mg every 14 days.  Lipid panel 11/2022 revealed LDL particle number down to 1694 from 2496, LDL down to 161 from 243. Consideration given to adding Nexlizet for additional lipid lowering. She reported chest discomfort and was referred by PCP to see Dr. Raford.  Repeat calcium  score was 0.  She reported significant stress due to caregiving for her father who has Alzheimer's dementia.  Lipid panel 06/15/2023  unfortunately revealed an increase in LDL to 167, small LDL P up from 555-839, and total particle number increased to 2018.  History of Present Illness [name] is a 53 y.o. female ***  ROS: ***  Studies Reviewed      ***  Lipoprotein (a)  Date/Time Value Ref Range Status  05/27/2022 09:28 AM 54.1 <75.0 nmol/L Final    Comment:    Note:  Values greater than or equal to 75.0 nmol/L may        indicate an independent risk factor for CHD,        but must be evaluated with caution when applied        to non-Caucasian populations due to the        influence of genetic factors on Lp(a) across        ethnicities.     Risk Assessment/Calculations {Does this patient have ATRIAL FIBRILLATION?:[phone]} No BP recorded.  {Refresh Note OR Click here to enter BP  :1}***       Physical Exam VS:  LMP 06/16/2015    Wt Readings from Last 3 Encounters:  12/26/23 148 lb 6.4 oz (67.3 kg)  06/19/23 152 lb 6.4 oz (69.1 kg)  12/22/22 164 lb 6.4 oz (74.6 kg)    GEN: Well nourished, well developed in no acute distress NECK: No JVD; No carotid bruits CARDIAC: ***RRR, no murmurs, rubs, gallops RESPIRATORY:  Clear to auscultation without rales, wheezing or rhonchi  ABDOMEN: Soft, non-tender, non-distended EXTREMITIES:  No edema; No deformity   ASSESSMENT AND PLAN ***    {Are you ordering a CV Procedure (  e.g. stress test, cath, DCCV, TEE, etc)?   Press F2        :[id number]}  Dispo: ***  Signed, Rosaline Bane, NP-C

## 2024-06-28 ENCOUNTER — Ambulatory Visit: Admitting: Family Medicine

## 2024-07-17 ENCOUNTER — Encounter: Payer: Self-pay | Admitting: Family Medicine

## 2024-07-17 ENCOUNTER — Ambulatory Visit (INDEPENDENT_AMBULATORY_CARE_PROVIDER_SITE_OTHER): Admitting: Family Medicine

## 2024-07-17 VITALS — BP 118/74 | HR 83 | Temp 98.2°F | Ht 64.0 in | Wt 146.4 lb

## 2024-07-17 DIAGNOSIS — D72819 Decreased white blood cell count, unspecified: Secondary | ICD-10-CM

## 2024-07-17 DIAGNOSIS — E663 Overweight: Secondary | ICD-10-CM | POA: Diagnosis not present

## 2024-07-17 DIAGNOSIS — R7303 Prediabetes: Secondary | ICD-10-CM | POA: Diagnosis not present

## 2024-07-17 DIAGNOSIS — E782 Mixed hyperlipidemia: Secondary | ICD-10-CM

## 2024-07-17 DIAGNOSIS — D649 Anemia, unspecified: Secondary | ICD-10-CM
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
## 2024-07-17 NOTE — Assessment & Plan Note (Addendum)
 Maximum weight: 254 lbs (02/2021) Current weight: 146 lbs Weight change since last visit: - 2 lbs Total weight loss: - 108 lbs (42.5 %)  [name] has achieved significant weight loss. She continues to be actively engaged in exercise. She is on maintenance dosing with tirzepatide  (Mounjaro ).

## 2024-07-17 NOTE — Assessment & Plan Note (Signed)
 Stable. Continue monitoring annually.

## 2024-07-17 NOTE — Assessment & Plan Note (Addendum)
 Continue Repatha . I recommend she continue to follow with cardiology and Dr. Mona regarding management of her familial hypercholesterolemia.

## 2024-07-19 ENCOUNTER — Encounter: Payer: Self-pay | Admitting: Family Medicine

## 2024-07-19 NOTE — Telephone Encounter (Signed)
 Dr. Romana Quay, MD [facility] [address],  [place], [place] [address] Phone: [phone]

## 2024-11-22 ENCOUNTER — Ambulatory Visit: Admitting: Internal Medicine

## 2025-01-15 ENCOUNTER — Ambulatory Visit: Admitting: Family Medicine
